# Patient Record
Sex: Male | Born: 1945
Health system: Southern US, Community
[De-identification: ages and names within clinical notes are randomized; demographics above are authoritative.]

## PROBLEM LIST (undated history)

## (undated) DIAGNOSIS — N4231 Prostatic intraepithelial neoplasia: Secondary | ICD-10-CM

## (undated) DIAGNOSIS — I10 Essential (primary) hypertension: Secondary | ICD-10-CM

## (undated) DIAGNOSIS — R42 Dizziness and giddiness: Secondary | ICD-10-CM

## (undated) DIAGNOSIS — C44310 Basal cell carcinoma of skin of unspecified parts of face: Secondary | ICD-10-CM

## (undated) DIAGNOSIS — Z86018 Personal history of other benign neoplasm: Secondary | ICD-10-CM

## (undated) DIAGNOSIS — E7800 Pure hypercholesterolemia, unspecified: Secondary | ICD-10-CM

## (undated) DIAGNOSIS — L570 Actinic keratosis: Secondary | ICD-10-CM

## (undated) DIAGNOSIS — I209 Angina pectoris, unspecified: Secondary | ICD-10-CM

## (undated) HISTORY — PX: COLONOSCOPY: SHX174

## (undated) HISTORY — DX: Actinic keratosis: L57.0

## (undated) HISTORY — DX: Prostatic intraepithelial neoplasia: N42.31

---
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
## 2006-09-22 ENCOUNTER — Ambulatory Visit: Payer: Self-pay | Admitting: Pain Medicine

## 2006-10-05 ENCOUNTER — Encounter: Admission: RE | Admit: 2006-10-05 | Discharge: 2006-10-05 | Payer: Self-pay | Admitting: Orthopedic Surgery

## 2013-03-31 DIAGNOSIS — R42 Dizziness and giddiness: Secondary | ICD-10-CM

## 2013-03-31 HISTORY — DX: Dizziness and giddiness: R42

## 2014-04-18 ENCOUNTER — Ambulatory Visit: Payer: Self-pay | Admitting: Gastroenterology

## 2014-04-18 LAB — HM COLONOSCOPY

## 2014-08-01 ENCOUNTER — Other Ambulatory Visit: Payer: Self-pay | Admitting: Orthopedic Surgery

## 2014-08-01 DIAGNOSIS — M25561 Pain in right knee: Secondary | ICD-10-CM

## 2014-08-03 ENCOUNTER — Ambulatory Visit
Admission: RE | Admit: 2014-08-03 | Discharge: 2014-08-03 | Disposition: A | Discharge status: Home / Self Care | Payer: 59 | Source: Ambulatory Visit | Attending: Orthopedic Surgery | Admitting: Orthopedic Surgery

## 2014-08-03 DIAGNOSIS — X58XXXA Exposure to other specified factors, initial encounter: Secondary | ICD-10-CM | POA: Insufficient documentation

## 2014-08-03 DIAGNOSIS — M25561 Pain in right knee: Secondary | ICD-10-CM | POA: Diagnosis present

## 2014-08-03 DIAGNOSIS — S83241A Other tear of medial meniscus, current injury, right knee, initial encounter: Secondary | ICD-10-CM | POA: Insufficient documentation

## 2014-08-16 ENCOUNTER — Encounter: Payer: Self-pay | Admitting: *Deleted

## 2014-08-16 ENCOUNTER — Inpatient Hospital Stay: Admission: RE | Admit: 2014-08-16 | Payer: Medicare Other | Source: Ambulatory Visit

## 2014-08-16 DIAGNOSIS — Z7982 Long term (current) use of aspirin: Secondary | ICD-10-CM | POA: Diagnosis not present

## 2014-08-16 DIAGNOSIS — E780 Pure hypercholesterolemia: Secondary | ICD-10-CM | POA: Diagnosis not present

## 2014-08-16 DIAGNOSIS — M23221 Derangement of posterior horn of medial meniscus due to old tear or injury, right knee: Secondary | ICD-10-CM | POA: Diagnosis present

## 2014-08-16 DIAGNOSIS — I10 Essential (primary) hypertension: Secondary | ICD-10-CM | POA: Diagnosis not present

## 2014-08-16 DIAGNOSIS — Z79899 Other long term (current) drug therapy: Secondary | ICD-10-CM | POA: Diagnosis not present

## 2014-08-16 DIAGNOSIS — R42 Dizziness and giddiness: Secondary | ICD-10-CM | POA: Diagnosis not present

## 2014-08-16 NOTE — Patient Instructions (Signed)
  Your procedure is scheduled on: 08-23-14 Report to [facility] 2nd Floor To find out your arrival time please call [phone] between 1PM - 3PM on 08-22-14 (Tuesday)  Remember: Instructions that are not followed completely may result in serious medical risk, up to and including death, or upon the discretion of your surgeon and anesthesiologist your surgery may need to be rescheduled.    __x__ 1. Do not eat food or drink liquids after midnight. No gum chewing or hard candies.     __x__ 2. No Alcohol for 24 hours before or after surgery.   ____ 3. Bring all medications with you on the day of surgery if instructed.    __x__ 4. Notify your doctor if there is any change in your medical condition     (cold, fever, infections).     Do not wear jewelry, make-up, hairpins, clips or nail polish.  Do not wear lotions, powders, or perfumes. You may wear deodorant.  Do not shave 48 hours prior to surgery. Men may shave face and neck.  Do not bring valuables to the hospital.    [facility] is not responsible for any belongings or valuables.               Contacts, dentures or bridgework may not be worn into surgery.  Leave your suitcase in the car. After surgery it may be brought to your room.  For patients admitted to the hospital, discharge time is determined by your treatment team.   Patients discharged the day of surgery will not be allowed to drive home.   Please read over the following fact sheets that you were given:   CHG INSTRUCTIONS   ____ Take these medicines the morning of surgery with A SIP OF WATER: NONE     ____ Fleet Enema (as directed)   __X__ Use CHG Soap as directed  ____ Use inhalers on the day of surgery  ____ Stop metformin 2 days prior to surgery    ____ Take 1/2 of usual insulin dose the night before surgery and none on the morning of surgery.   __X__ Stop ASPIRIN NOW (08-16-14)  ____ Stop Anti-inflammatories -MAY TAKE TYLENOL IF  NEEDED   __X__ Stop supplements until after surgery-STOP FISH OIL NOW (08-16-14)  ____ Bring C-Pap to the hospital.

## 2014-08-21 ENCOUNTER — Encounter
Admission: RE | Admit: 2014-08-21 | Discharge: 2014-08-21 | Disposition: A | Discharge status: Home / Self Care | Payer: 59 | Source: Ambulatory Visit | Attending: Orthopedic Surgery | Admitting: Orthopedic Surgery

## 2014-08-21 DIAGNOSIS — E780 Pure hypercholesterolemia: Secondary | ICD-10-CM | POA: Diagnosis not present

## 2014-08-21 DIAGNOSIS — I10 Essential (primary) hypertension: Secondary | ICD-10-CM | POA: Diagnosis not present

## 2014-08-21 LAB — URINALYSIS COMPLETE WITH MICROSCOPIC (ARMC ONLY)
Bacteria, UA: NONE SEEN
Bilirubin Urine: NEGATIVE
GLUCOSE, UA: NEGATIVE mg/dL
Hgb urine dipstick: NEGATIVE
KETONES UR: NEGATIVE mg/dL
Leukocytes, UA: NEGATIVE
Nitrite: NEGATIVE
PH: 5 (ref 5.0–8.0)
Protein, ur: NEGATIVE mg/dL
Specific Gravity, Urine: 1.026 (ref 1.005–1.030)
Squamous Epithelial / LPF: NONE SEEN

## 2014-08-21 LAB — BASIC METABOLIC PANEL
Anion gap: 8 (ref 5–15)
BUN: 22 mg/dL — ABNORMAL HIGH (ref 6–20)
CO2: 28 mmol/L (ref 22–32)
Calcium: 9.3 mg/dL (ref 8.9–10.3)
Chloride: 105 mmol/L (ref 101–111)
Creatinine, Ser: 1.21 mg/dL (ref 0.61–1.24)
GFR calc non Af Amer: >60 mL/min (ref >60)
Glucose, Bld: 104 mg/dL — ABNORMAL HIGH (ref 65–99)
Potassium: 4.3 mmol/L (ref 3.5–5.1)
Sodium: 141 mmol/L (ref 135–145)

## 2014-08-21 LAB — CBC
HCT: 46.3 % (ref 40.0–52.0)
Hemoglobin: 15.3 g/dL (ref 13.0–18.0)
MCH: 29.7 pg (ref 26.0–34.0)
MCHC: 32.9 g/dL (ref 32.0–36.0)
MCV: 90.1 fL (ref 80.0–100.0)
PLATELETS: 219 10*3/uL (ref 150–440)
RBC: 5.14 MIL/uL (ref 4.40–5.90)
RDW: 13.5 % (ref 11.5–14.5)
WBC: 6.4 10*3/uL (ref 3.8–10.6)

## 2014-08-21 LAB — PROTIME-INR
INR: 0.89
Prothrombin Time: 12.3 seconds (ref 11.4–15.0)

## 2014-08-21 LAB — APTT: APTT: 26 s (ref 24–36)

## 2014-08-23 ENCOUNTER — Encounter
Admission: RE | Disposition: A | Discharge status: Home / Self Care | Payer: Self-pay | Source: Ambulatory Visit | Attending: Orthopedic Surgery

## 2014-08-23 ENCOUNTER — Ambulatory Visit: Payer: 59 | Admitting: Anesthesiology

## 2014-08-23 ENCOUNTER — Ambulatory Visit
Admission: RE | Admit: 2014-08-23 | Discharge: 2014-08-23 | Disposition: A | Discharge status: Home / Self Care | Payer: 59 | Source: Ambulatory Visit | Attending: Orthopedic Surgery | Admitting: Orthopedic Surgery

## 2014-08-23 ENCOUNTER — Encounter: Payer: Self-pay | Admitting: Anesthesiology

## 2014-08-23 DIAGNOSIS — E780 Pure hypercholesterolemia: Secondary | ICD-10-CM | POA: Insufficient documentation

## 2014-08-23 DIAGNOSIS — I10 Essential (primary) hypertension: Secondary | ICD-10-CM | POA: Insufficient documentation

## 2014-08-23 DIAGNOSIS — Z7982 Long term (current) use of aspirin: Secondary | ICD-10-CM | POA: Insufficient documentation

## 2014-08-23 DIAGNOSIS — Z79899 Other long term (current) drug therapy: Secondary | ICD-10-CM | POA: Insufficient documentation

## 2014-08-23 DIAGNOSIS — Z9889 Other specified postprocedural states: Secondary | ICD-10-CM

## 2014-08-23 DIAGNOSIS — R42 Dizziness and giddiness: Secondary | ICD-10-CM | POA: Insufficient documentation

## 2014-08-23 HISTORY — PX: KNEE ARTHROSCOPY WITH MEDIAL MENISECTOMY: SHX5651

## 2014-08-23 HISTORY — DX: Essential (primary) hypertension: I10

## 2014-08-23 HISTORY — DX: Pure hypercholesterolemia, unspecified: E78.00

## 2014-08-23 HISTORY — DX: Dizziness and giddiness: R42

## 2014-08-23 SURGERY — ARTHROSCOPY, KNEE, WITH MEDIAL MENISCECTOMY
Anesthesia: General | Laterality: Right | Wound class: Clean

## 2014-08-23 MED ORDER — ONDANSETRON HCL 4 MG/2ML IJ SOLN
INTRAMUSCULAR | Status: DC | PRN
  Administered 2014-08-23: 4 mg via INTRAVENOUS

## 2014-08-23 MED ORDER — KETOROLAC TROMETHAMINE 30 MG/ML IJ SOLN
INTRAMUSCULAR | Status: DC | PRN
  Administered 2014-08-23: 30 mg via INTRAVENOUS

## 2014-08-23 MED ORDER — FENTANYL CITRATE (PF) 100 MCG/2ML IJ SOLN
25.0000 ug | INTRAMUSCULAR | Status: DC | PRN
  Administered 2014-08-23 (×4): 25 ug via INTRAVENOUS

## 2014-08-23 MED ORDER — CEFAZOLIN SODIUM-DEXTROSE 2-3 GM-% IV SOLR: 2.0000 g | Freq: Once | INTRAVENOUS | Status: DC

## 2014-08-23 MED ORDER — MIDAZOLAM HCL 2 MG/2ML IJ SOLN
INTRAMUSCULAR | Status: DC | PRN
  Administered 2014-08-23: 2 mg via INTRAVENOUS

## 2014-08-23 MED ORDER — HYDROCODONE-ACETAMINOPHEN 5-325 MG PO TABS: 2.0000 | ORAL_TABLET | ORAL | Status: DC | PRN

## 2014-08-23 MED ORDER — PHENYLEPHRINE HCL 10 MG/ML IJ SOLN
INTRAMUSCULAR | Status: DC | PRN
  Administered 2014-08-23 (×2): 100 ug via INTRAVENOUS

## 2014-08-23 MED ORDER — FENTANYL CITRATE (PF) 100 MCG/2ML IJ SOLN
INTRAMUSCULAR | Status: AC
  Administered 2014-08-23: 25 ug via INTRAVENOUS
  Filled 2014-08-23: qty 2

## 2014-08-23 MED ORDER — LIDOCAINE HCL (PF) 1 % IJ SOLN
INTRAMUSCULAR | Status: AC
  Filled 2014-08-23: qty 2

## 2014-08-23 MED ORDER — BUPIVACAINE-EPINEPHRINE (PF) 0.25% -1:200000 IJ SOLN
INTRAMUSCULAR | Status: AC
  Filled 2014-08-23: qty 30

## 2014-08-23 MED ORDER — EPHEDRINE SULFATE 50 MG/ML IJ SOLN
INTRAMUSCULAR | Status: DC | PRN
Start: 2014-08-23 — End: 2014-08-23
  Administered 2014-08-23: 5 mg via INTRAVENOUS
  Administered 2014-08-23: 20 mg via INTRAVENOUS

## 2014-08-23 MED ORDER — GLYCOPYRROLATE 0.2 MG/ML IJ SOLN
INTRAMUSCULAR | Status: DC | PRN
  Administered 2014-08-23: .15 mg via INTRAVENOUS

## 2014-08-23 MED ORDER — LACTATED RINGERS IV SOLN
INTRAVENOUS | Status: DC
  Administered 2014-08-23: 13:00:00 via INTRAVENOUS

## 2014-08-23 MED ORDER — CEFAZOLIN SODIUM-DEXTROSE 2-3 GM-% IV SOLR
INTRAVENOUS | Status: AC
  Administered 2014-08-23: 2 g via INTRAVENOUS
  Filled 2014-08-23: qty 50

## 2014-08-23 MED ORDER — FENTANYL CITRATE (PF) 100 MCG/2ML IJ SOLN
INTRAMUSCULAR | Status: DC | PRN
  Administered 2014-08-23: 100 ug via INTRAVENOUS

## 2014-08-23 MED ORDER — HYDROCODONE-ACETAMINOPHEN 5-325 MG PO TABS: 1.0000 | ORAL_TABLET | Freq: Four times a day (QID) | ORAL | Status: DC | PRN

## 2014-08-23 MED ORDER — LIDOCAINE HCL 1 % IJ SOLN
INTRAMUSCULAR | Status: DC | PRN
  Administered 2014-08-23: 10 mL

## 2014-08-23 MED ORDER — ONDANSETRON HCL 4 MG/2ML IJ SOLN: 4.0000 mg | Freq: Once | INTRAMUSCULAR | Status: DC | PRN

## 2014-08-23 MED ORDER — ASPIRIN 325 MG PO TBEC: 325.0000 mg | DELAYED_RELEASE_TABLET | Freq: Every day | ORAL | Status: DC

## 2014-08-23 MED ORDER — PROMETHAZINE HCL 12.5 MG PO TABS: 12.5000 mg | ORAL_TABLET | ORAL | Status: DC | PRN

## 2014-08-23 MED ORDER — FAMOTIDINE 20 MG PO TABS
ORAL_TABLET | ORAL | Status: AC
  Administered 2014-08-23: 20 mg via ORAL
  Filled 2014-08-23: qty 1

## 2014-08-23 MED ORDER — BUPIVACAINE-EPINEPHRINE 0.25% -1:200000 IJ SOLN
INTRAMUSCULAR | Status: DC | PRN
  Administered 2014-08-23: 30 mL

## 2014-08-23 MED ORDER — FAMOTIDINE 20 MG PO TABS
20.0000 mg | ORAL_TABLET | Freq: Once | ORAL | Status: AC
  Administered 2014-08-23: 20 mg via ORAL

## 2014-08-23 MED ORDER — LIDOCAINE HCL (PF) 1 % IJ SOLN
INTRAMUSCULAR | Status: AC
  Filled 2014-08-23: qty 30

## 2014-08-23 SURGICAL SUPPLY — 35 items
BUR RADIUS 3.5 (BURR) ×3 IMPLANT
BUR RADIUS 4.0X18.5 (BURR) ×3 IMPLANT
CLOSURE WOUND 1/2 X4 (GAUZE/BANDAGES/DRESSINGS) ×1
COOLER POLAR GLACIER W/PUMP (MISCELLANEOUS) ×3 IMPLANT
DRAPE IMP U-DRAPE 54X76 (DRAPES) ×3 IMPLANT
DURAPREP 26ML APPLICATOR (WOUND CARE) ×9 IMPLANT
GAUZE PETRO XEROFOAM 1X8 (MISCELLANEOUS) ×3 IMPLANT
GAUZE SPONGE 4X4 12PLY STRL (GAUZE/BANDAGES/DRESSINGS) ×3 IMPLANT
GLOVE BIOGEL PI IND STRL 9 (GLOVE) ×1 IMPLANT
GLOVE BIOGEL PI INDICATOR 9 (GLOVE) ×2
GLOVE SURG 9.0 ORTHO LTXF (GLOVE) ×3 IMPLANT
GOWN L4 XXLG W/PAP TWL (GOWN DISPOSABLE) ×3 IMPLANT
GOWN STRL REUS W/ TWL LRG LVL3 (GOWN DISPOSABLE) ×1 IMPLANT
GOWN STRL REUS W/TWL LRG LVL3 (GOWN DISPOSABLE) ×2
IV LACTATED RINGER IRRG 3000ML (IV SOLUTION) ×12
IV LR IRRIG 3000ML ARTHROMATIC (IV SOLUTION) ×6 IMPLANT
KIT RM TURNOVER STRD PROC AR (KITS) ×3 IMPLANT
MANIFOLD NEPTUNE II (INSTRUMENTS) ×3 IMPLANT
PACK ARTHROSCOPY KNEE (MISCELLANEOUS) ×3 IMPLANT
PAD ABD DERMACEA PRESS 5X9 (GAUZE/BANDAGES/DRESSINGS) ×6 IMPLANT
PAD CAST CTTN 4X4 STRL (SOFTGOODS) ×1 IMPLANT
PAD WRAPON POLAR KNEE (MISCELLANEOUS) ×1 IMPLANT
PADDING CAST COTTON 4X4 STRL (SOFTGOODS) ×2
SET TUBE SUCT SHAVER OUTFL 24K (TUBING) ×3 IMPLANT
SOL PREP PVP 2OZ (MISCELLANEOUS) ×3
SOLUTION PREP PVP 2OZ (MISCELLANEOUS) ×1 IMPLANT
STOCKINETTE BIAS CUT 4 980044 (GAUZE/BANDAGES/DRESSINGS) ×3 IMPLANT
STRIP CLOSURE SKIN 1/2X4 (GAUZE/BANDAGES/DRESSINGS) ×2 IMPLANT
SUT ETHILON 4-0 (SUTURE) ×2
SUT ETHILON 4-0 FS2 18XMFL BLK (SUTURE) ×1
SUTURE ETHLN 4-0 FS2 18XMF BLK (SUTURE) ×1 IMPLANT
TUBING ARTHRO INFLOW-ONLY STRL (TUBING) ×3 IMPLANT
WAND HAND CNTRL MULTIVAC 50 (MISCELLANEOUS) ×3 IMPLANT
WAND HAND CNTRL MULTIVAC 90 (MISCELLANEOUS) ×3 IMPLANT
WRAPON POLAR PAD KNEE (MISCELLANEOUS) ×3

## 2014-08-23 NOTE — Transfer of Care (Signed)
Immediate Anesthesia Transfer of Care Note  Patient: Hector Woodard  Procedure(s) Performed: Procedure(s): KNEE ARTHROSCOPY WITH MEDIAL MENISECTOMY, plica excision and synovectomy (Right)  Patient Location: PACU  Anesthesia Type:General  Level of Consciousness: Alert, Awake, Oriented  Airway & Oxygen Therapy: Patient Spontanous Breathing  Post-op Assessment: Report given to RN  Post vital signs: Reviewed and stable  Last Vitals:  Filed Vitals:   08/23/14 1509  BP: 125/63  Pulse: 66  Temp: 36.4 C  Resp: 16    Complications: No apparent anesthesia complications

## 2014-08-23 NOTE — Anesthesia Postprocedure Evaluation (Signed)
  Anesthesia Post-op Note  Patient: Hector Woodard  Procedure(s) Performed: Procedure(s): KNEE ARTHROSCOPY WITH MEDIAL MENISECTOMY, plica excision and synovectomy (Right)  Anesthesia type:General  Patient location: PACU  Post pain: Pain level controlled  Post assessment: Post-op Vital signs reviewed, Patient's Cardiovascular Status Stable, Respiratory Function Stable, Patent Airway and No signs of Nausea or vomiting  Post vital signs: Reviewed and stable  Last Vitals:  Filed Vitals:   08/23/14 1531  BP: 120/73  Pulse: 60  Temp:   Resp: 12    Level of consciousness: awake, alert  and patient cooperative  Complications: No apparent anesthesia complications

## 2014-08-23 NOTE — Discharge Instructions (Signed)
AMBULATORY SURGERY  DISCHARGE INSTRUCTIONS   1) The drugs that you were given will stay in your system until tomorrow so for the next 24 hours you should not:  A) Drive an automobile B) Make any legal decisions C) Drink any alcoholic beverage   2) You may resume regular meals tomorrow.  Today it is better to start with liquids and gradually work up to solid foods.  You may eat anything you prefer, but it is better to start with liquids, then soup and crackers, and gradually work up to solid foods.   3) Please notify your doctor immediately if you have any unusual bleeding, trouble breathing, redness and pain at the surgery site, drainage, fever, or pain not relieved by medication. 4)   5) Your post-operative visit with Dr.    George Ina                                 is: Date:                        Time:    Please call to schedule your post-operative visit.  6) Additional Instructions: 7)

## 2014-08-23 NOTE — Anesthesia Preprocedure Evaluation (Signed)
Anesthesia Evaluation  Patient identified by MRN, date of birth, ID band Patient awake    Reviewed: Allergy & Precautions, NPO status , Patient's Chart, lab work & pertinent test results  Airway Mallampati: II  TM Distance: >3 FB Neck ROM: Full    Dental  (+) Chipped   Pulmonary          Cardiovascular hypertension,     Neuro/Psych    GI/Hepatic   Endo/Other    Renal/GU      Musculoskeletal   Abdominal   Peds  Hematology   Anesthesia Other Findings   Reproductive/Obstetrics                             Anesthesia Physical Anesthesia Plan  ASA: II  Anesthesia Plan: General   Post-op Pain Management:    Induction: Intravenous  Airway Management Planned: LMA  Additional Equipment:   Intra-op Plan:   Post-operative Plan:   Informed Consent: I have reviewed the patients History and Physical, chart, labs and discussed the procedure including the risks, benefits and alternatives for the proposed anesthesia with the patient or authorized representative who has indicated his/her understanding and acceptance.     Plan Discussed with: CRNA  Anesthesia Plan Comments:         Anesthesia Quick Evaluation

## 2014-08-23 NOTE — H&P (Signed)
PREOPERATIVE H&P  Chief Complaint: TEAR OF MEDIAL MENISCUS OF KNEE  HPI: Hector Woodard is a 69 y.o. male who presents for preoperative history and physical with a diagnosis of TEAR OF MEDIAL MENISCUS OF KNEE. Patient has moderate intermittent pain with mechanical symptoms. His symptoms are worsened by running. He is an avid runner and this has prevented him from participating in his normal exercise routine.   His MRI has shown a tear of the medial meniscus. He has elected for surgical management.   Past Medical History  Diagnosis Date  . Hypertension   . Hypercholesteremia   . Vertigo 2015   Past Surgical History  Procedure Laterality Date  . Colonoscopy      X2   History   Social History  . Marital Status: Married    Spouse Name: N/A  . Number of Children: N/A  . Years of Education: N/A   Social History Main Topics  . Smoking status: Never Smoker   . Smokeless tobacco: Not on file  . Alcohol Use: Yes     Comment: wine 2x/week  . Drug Use: No  . Sexual Activity: Not on file   Other Topics Concern  . None   Social History Narrative   History reviewed. No pertinent family history. No Known Allergies Prior to Admission medications   Medication Sig Start Date End Date Taking? Authorizing Provider  aspirin 81 MG tablet Take 81 mg by mouth daily.   Yes Historical Provider, MD  benazepril (LOTENSIN) 20 MG tablet Take 20 mg by mouth at bedtime.   Yes Historical Provider, MD  Fish Oil OIL by Does not apply route.   Yes Historical Provider, MD  Multiple Vitamin (MULTIVITAMIN) capsule Take 1 capsule by mouth daily.   Yes Historical Provider, MD  Omega-3 Fatty Acids (FISH OIL) 1000 MG CAPS Take 1 capsule by mouth at bedtime.   Yes Historical Provider, MD  simvastatin (ZOCOR) 40 MG tablet Take 40 mg by mouth at bedtime.   Yes Historical Provider, MD     Positive ROS: All other systems have been reviewed and were otherwise negative with the exception of those mentioned in the  HPI and as above.  Physical Exam: General: Alert, no acute distress Cardiovascular:  regular rate and rhythm no murmurs rubs or gallops No pedal edema Respiratory:  clear to auscultation bilaterally No cyanosis, no use of accessory musculature GI: No organomegaly, abdomen is soft and non-tender, nondistended with positive bowel sounds  Skin: No lesions in the area of chief complaint Neurologic: Sensation intact distally Psychiatric: Patient is competent for consent with normal mood and affect Lymphatic: No cervical lymphadenopathy  MUSCright knee patient is no erythema or ecchymosis or effusion. His range of motion is from 0-130. He has point tenderness over the medial joint line and a positive McMurray's test. He has 5 out of 5 strength in all muscle groups, intact sensation to light touch in palpable pedal pulses. He had no ligamentous laxity on exam.   Assessment: TEAR OF MEDIAL MENISCUS OF  RIGHT KNEE  Plan: Plan for Procedure(s): KNEE ARTHROSCOPY WITH  right partial MEDIAL MENISECTOMY  I discussed the findings on MRI with the patient in my office prior to surgery. He is aware of the tear of the medial meniscus. He is having mechanical symptoms that prevent him from running. He would like to proceed with arthroscopic partial medial meniscectomy. I discussed with him the details of surgery.  I discussed the risks and benefits of surgery. The  risks include but are not limited to infection, bleeding requiring blood transfusion, nerve or blood vessel injury, malunion, nonunion, joint stiffness or loss of motion, persistent pain, weakness or instability,and the need for further surgery. Medical risks include but are not limited to DVT and pulmonary embolism,: Myocardial infarction, stroke, pneumonia, respiratory failure and death. Patient understood these risks and wished to proceed.    Thornton Park, MD   08/23/2014 1:19 PM

## 2014-08-23 NOTE — Op Note (Signed)
08/17/2014  2:42 PM  PATIENT:  Hector Woodard  PRE-OPERATIVE DIAGNOSIS:  TEAR OF MEDIAL MENISCUS  POST-OPERATIVE DIAGNOSIS:  Same  PROCEDURE:  KNEE ARTHROSCOPY WITH  Partial MEDIAL MENISECTOMY, synovectomy  SURGEON:  Thornton Park, MD  ANESTHESIA:   General  PREOPERATIVE INDICATIONS:  Hector Woodard  69 y.o. male with a diagnosis of TEAR OF MEDIAL MENISCUS, right knee, who failed conservative management and elected for surgical management.    The risks benefits and alternatives were discussed with the patient preoperatively including the risks of infection, bleeding, nerve injury, knee stiffness, persistent pain, osteoarthritis and the need for further surgery. Medical course include DVT and pulmonary embolism, myocardial infarction, stroke, pneumonia, respiratory failure and death. The patient understood these risks and wished to proceed.  OPERATIVE FINDINGS: A tear of the posterior horn of the medial meniscus was identified with a large mobile flap which was unstable. There was also diffuse grade 1 and 2 chondral wear of the medial femoral condyle and grade 1 and 2 chondral wear of the undersurface of the patella with grade 3/4 wear of the femoral trochlea.   OPERATIVE PROCEDURE: Patient was met in the preoperative area. The operative extremity was signed with the word yes and my initials according the hospital's correct site of surgery protocol.  He is brought in the operating room where he was placed supine on the operative table. He underwent general anesthesia. He was prepped and draped in a sterile fashion.  A timeout was performed to verify the patient's name, date of birth, medical record number, correct site of surgery correct procedure to be performed. It was also used to verify the patient had had received antibiotics that all appropriate instruments, and radiographic studies were available in the room. Once all in attendance were in agreement case began.  Proposed  arthroscopy incisions were drawn out with a surgical marker. These were pre-injected with 1% lidocaine plain. An 11 blade was used to establish an inferior lateral and inferomedial portals. The inferomedial portal was created using a 18-gauge spinal needle under direct visualization.  A full diagnostic examination of the knee was performed including the suprapatellar pouch, patellofemoral joint, medial lateral compartments as well as the medial lateral gutters, the intercondylar notch in the posterior knee.  Findings on arthroscopy included diffuse synovitis and diffuse degenerative changes of the undersurface of the patella and the associated femoral trochlea. Patient also had diffuse degenerative changes in the medial compartment within the medial femoral condyle , femoral trochlea and undersurface of patella. He had a grade 3 chondral lesion involving the femoral trochlea. Patient had a torn medial meniscus with an unstable flap. The lateral compartment had no evidence of lateral meniscus tear. The anterior cruciate ligament was intact.  Patient had the meniscal tear treated with a 4-0 resector shaver blade and straight duckbill basket. The meniscus was debrided until a stable rim was achieved. A chondroplasty of the medial femoral condyle, trochlea and undersurface of patella was also performed using a 4 resector shaver blade. A partial synovectomy was also performed using a 4-0 resector shaver blade and 90 ArthroCare wand.  The knee was then copiously lavaged. All arthroscopic incisions removed. The 2 arthroscopy portals were closed with 4-0 nylon. Steri-Strips were applied along with a dry sterile and compressive dressing. The patient was brought to the PACU in stable condition. I scrubbed and present the entire case and all sharp and instrument counts were correct at the conclusion the case. I spoke to the patient's family  postoperatively to let them know the case it done without complication and the  patient was stable in the recovery room.  Timoteo Gaul, MD

## 2014-08-24 ENCOUNTER — Encounter: Payer: Self-pay | Admitting: Orthopedic Surgery

## 2014-09-04 DIAGNOSIS — M25661 Stiffness of right knee, not elsewhere classified: Secondary | ICD-10-CM | POA: Diagnosis not present

## 2014-09-04 DIAGNOSIS — M25561 Pain in right knee: Secondary | ICD-10-CM | POA: Diagnosis not present

## 2014-09-20 DIAGNOSIS — M25561 Pain in right knee: Secondary | ICD-10-CM | POA: Diagnosis not present

## 2014-09-20 DIAGNOSIS — M25661 Stiffness of right knee, not elsewhere classified: Secondary | ICD-10-CM | POA: Diagnosis not present

## 2014-11-20 ENCOUNTER — Telehealth: Payer: Self-pay | Admitting: Family Medicine

## 2014-11-20 MED ORDER — ZOLPIDEM TARTRATE 10 MG PO TABS
10.0000 mg | ORAL_TABLET | Freq: Every evening | ORAL | Status: DC | PRN
Start: 2014-11-20 — End: 2015-02-28

## 2014-11-20 MED ORDER — CIPROFLOXACIN HCL 500 MG PO TABS: 500.0000 mg | ORAL_TABLET | Freq: Two times a day (BID) | ORAL | Status: DC

## 2014-11-20 NOTE — Telephone Encounter (Signed)
Pt called stated he talked with [facility] about a Hep B injections. Pt stated [facility] is also supposed to write an RX for Ambien and antibiotic, pt is leaving this week for a month long trip to [place]. I am adding pt to schedule for an injection. Please advise about medications. Thanks.

## 2014-11-22 ENCOUNTER — Ambulatory Visit (INDEPENDENT_AMBULATORY_CARE_PROVIDER_SITE_OTHER): Payer: Commercial Managed Care - HMO

## 2014-11-22 DIAGNOSIS — Z23 Encounter for immunization: Secondary | ICD-10-CM | POA: Diagnosis not present

## 2015-01-05 ENCOUNTER — Other Ambulatory Visit: Payer: Self-pay

## 2015-01-05 NOTE — Telephone Encounter (Addendum)
[name] DOB: 05/15/1945 MRN: [id number]  Patient requests simvastatin 40 mg tab # 30 and benazepril hcl 20mg  tab # 30.

## 2015-01-08 MED ORDER — BENAZEPRIL HCL 20 MG PO TABS
20.0000 mg | ORAL_TABLET | Freq: Every day | ORAL | Status: DC
Start: 2015-01-08 — End: 2015-02-12

## 2015-01-08 MED ORDER — SIMVASTATIN 40 MG PO TABS: 40.0000 mg | ORAL_TABLET | Freq: Every day | ORAL | Status: DC

## 2015-01-23 ENCOUNTER — Encounter: Payer: Self-pay | Admitting: Family Medicine

## 2015-02-12 ENCOUNTER — Encounter: Payer: Self-pay | Admitting: Family Medicine

## 2015-02-12 ENCOUNTER — Ambulatory Visit (INDEPENDENT_AMBULATORY_CARE_PROVIDER_SITE_OTHER): Payer: Commercial Managed Care - HMO | Admitting: Family Medicine

## 2015-02-12 VITALS — BP 121/64 | HR 65 | Temp 98.8°F | Ht 71.7 in | Wt 197.0 lb

## 2015-02-12 DIAGNOSIS — Z0000 Encounter for general adult medical examination without abnormal findings: Secondary | ICD-10-CM | POA: Diagnosis not present

## 2015-02-12 DIAGNOSIS — E7800 Pure hypercholesterolemia, unspecified: Secondary | ICD-10-CM | POA: Diagnosis not present

## 2015-02-12 DIAGNOSIS — Z23 Encounter for immunization: Secondary | ICD-10-CM

## 2015-02-12 DIAGNOSIS — N4231 Prostatic intraepithelial neoplasia: Secondary | ICD-10-CM | POA: Insufficient documentation

## 2015-02-12 DIAGNOSIS — R079 Chest pain, unspecified: Secondary | ICD-10-CM | POA: Diagnosis not present

## 2015-02-12 DIAGNOSIS — I10 Essential (primary) hypertension: Secondary | ICD-10-CM

## 2015-02-12 LAB — URINALYSIS, ROUTINE W REFLEX MICROSCOPIC
Bilirubin, UA: NEGATIVE
Glucose, UA: NEGATIVE
Ketones, UA: NEGATIVE
Leukocytes, UA: NEGATIVE
Nitrite, UA: NEGATIVE
Protein, UA: NEGATIVE
Specific Gravity, UA: 1.015 (ref 1.005–1.030)
Urobilinogen, Ur: 0.2 mg/dL (ref 0.2–1.0)
pH, UA: 6 (ref 5.0–7.5)

## 2015-02-12 LAB — MICROSCOPIC EXAMINATION
Epithelial Cells (non renal): NONE SEEN /hpf (ref 0–10)
WBC, UA: NONE SEEN /hpf (ref 0–?)

## 2015-02-12 MED ORDER — SIMVASTATIN 40 MG PO TABS: 40.0000 mg | ORAL_TABLET | Freq: Every day | ORAL | Status: DC

## 2015-02-12 MED ORDER — BENAZEPRIL HCL 20 MG PO TABS: 20.0000 mg | ORAL_TABLET | Freq: Every day | ORAL | Status: DC

## 2015-02-12 NOTE — Assessment & Plan Note (Signed)
The current medical regimen is effective;  continue present plan and medications.  

## 2015-02-12 NOTE — Assessment & Plan Note (Signed)
EKG no acute changes Patient with change in cardiovascular status with loss of ability to have peak exertion, with bilateral arm numbness with exertion and nonspecific chest slight numbness. Because the symptoms will refer to cardiology to further evaluate

## 2015-02-12 NOTE — Progress Notes (Signed)
BP 121/64 mmHg  Pulse 65  Temp(Src) 98.8 F (37.1 C)  Ht 5' 11.7" (1.821 m)  Wt 197 lb (89.359 kg)  BMI 26.95 kg/m2  SpO2 99%   Subjective:    Patient ID: Hector Woodard, male    DOB: Jan 09, 1946, 69 y.o.   MRN: [id number]  HPI: Hector Woodard is a 69 y.o. male  Chief Complaint  Patient presents with  . Annual Exam   Patient for annual physical exam but is noticed after a vacation to [place] that is unable to get his heart rate up to as high as usual 150s or so with his usual running now can only get up into the upper one teens before he gets markedly breathless and needs to slow down. Pace's gone from 12 minute to 15 minute miles Otherwise is doing okay with no chest pain chest tightness for routine daily activities or mild distress. Does little better with a long slow warmup but otherwise upper gear is lacking  Blood pressures doing well no complaints from medications Cluster on doing well no complaints from medications  Relevant past medical, surgical, family and social history reviewed and updated as indicated. Interim medical history since our last visit reviewed. Allergies and medications reviewed and updated.  Review of Systems  Constitutional: Negative.   HENT: Negative.   Eyes: Negative.   Respiratory: Negative.   Cardiovascular: Negative.   Gastrointestinal: Negative.   Endocrine: Negative.   Genitourinary: Negative.   Musculoskeletal: Negative.   Skin: Negative.   Allergic/Immunologic: Negative.   Neurological: Negative.   Hematological: Negative.   Psychiatric/Behavioral: Negative.     Per HPI unless specifically indicated above     Objective:    BP 121/64 mmHg  Pulse 65  Temp(Src) 98.8 F (37.1 C)  Ht 5' 11.7" (1.821 m)  Wt 197 lb (89.359 kg)  BMI 26.95 kg/m2  SpO2 99%  Wt Readings from Last 3 Encounters:  02/12/15 197 lb (89.359 kg)  08/08/14 203 lb (92.08 kg)  08/16/14 200 lb (90.719 kg)    Physical Exam  Constitutional: He is oriented  to person, place, and time. He appears well-developed and well-nourished.  HENT:  Head: Normocephalic and atraumatic.  Right Ear: External ear normal.  Left Ear: External ear normal.  Eyes: Conjunctivae and EOM are normal. Pupils are equal, round, and reactive to light.  Neck: Normal range of motion. Neck supple.  Cardiovascular: Normal rate, regular rhythm, normal heart sounds and intact distal pulses.   Pulmonary/Chest: Effort normal and breath sounds normal.  Abdominal: Soft. Bowel sounds are normal. There is no splenomegaly or hepatomegaly.  Genitourinary: Rectum normal, prostate normal and penis normal.  Musculoskeletal: Normal range of motion.  Neurological: He is alert and oriented to person, place, and time. He has normal reflexes.  Skin: No rash noted. No erythema.  Psychiatric: He has a normal mood and affect. His behavior is normal. Judgment and thought content normal.    Results for orders placed or performed during the hospital encounter of [id number]  Basic metabolic panel  Result Value Ref Range   Sodium 141 135 - 145 mmol/L   Potassium 4.3 3.5 - 5.1 mmol/L   Chloride 105 101 - 111 mmol/L   CO2 28 22 - 32 mmol/L   Glucose, Bld 104 (H) 65 - 99 mg/dL   BUN 22 (H) 6 - 20 mg/dL   Creatinine, Ser 1.21 0.61 - 1.24 mg/dL   Calcium 9.3 8.9 - 10.3 mg/dL   GFR calc  non Af Amer >60 >60 mL/min   GFR calc Af Amer >60 >60 mL/min   Anion gap 8 5 - 15  CBC  Result Value Ref Range   WBC 6.4 3.8 - 10.6 K/uL   RBC 5.14 4.40 - 5.90 MIL/uL   Hemoglobin 15.3 13.0 - 18.0 g/dL   HCT 46.3 40.0 - 52.0 %   MCV 90.1 80.0 - 100.0 fL   MCH 29.7 26.0 - 34.0 pg   MCHC 32.9 32.0 - 36.0 g/dL   RDW 13.5 11.5 - 14.5 %   Platelets 219 150 - 440 K/uL  APTT  Result Value Ref Range   aPTT 26 24 - 36 seconds  Protime-INR  Result Value Ref Range   Prothrombin Time 12.3 11.4 - 15.0 seconds   INR 0.89   Urinalysis complete, with microscopic [facility])  Result Value Ref Range   Color, Urine YELLOW (A)  YELLOW   APPearance HAZY (A) CLEAR   Glucose, UA NEGATIVE NEGATIVE mg/dL   Bilirubin Urine NEGATIVE NEGATIVE   Ketones, ur NEGATIVE NEGATIVE mg/dL   Specific Gravity, Urine 1.026 1.005 - 1.030   Hgb urine dipstick NEGATIVE NEGATIVE   pH 5.0 5.0 - 8.0   Protein, ur NEGATIVE NEGATIVE mg/dL   Nitrite NEGATIVE NEGATIVE   Leukocytes, UA NEGATIVE NEGATIVE   RBC / HPF 0-5 0 - 5 RBC/hpf   WBC, UA 0-5 0 - 5 WBC/hpf   Bacteria, UA NONE SEEN NONE SEEN   Squamous Epithelial / LPF NONE SEEN NONE SEEN   Mucous PRESENT       Assessment & Plan:   Problem List Items Addressed This Visit      Cardiovascular and Mediastinum   Essential hypertension   Relevant Medications   aspirin EC 81 MG tablet   benazepril (LOTENSIN) 20 MG tablet   simvastatin (ZOCOR) 40 MG tablet   Other Relevant Orders   Comprehensive metabolic panel   Lipid panel   CBC with Differential/Platelet   TSH   Urinalysis, Routine w reflex microscopic (not at [facility])     Genitourinary   PIN (prostatic intraepithelial neoplasia)   Relevant Orders   PSA     Other   Hypercholesteremia    The current medical regimen is effective;  continue present plan and medications.       Relevant Medications   aspirin EC 81 MG tablet   benazepril (LOTENSIN) 20 MG tablet   simvastatin (ZOCOR) 40 MG tablet   Other Relevant Orders   Comprehensive metabolic panel   Lipid panel   CBC with Differential/Platelet   TSH   Urinalysis, Routine w reflex microscopic (not at [facility])   Chest pain    EKG no acute changes Patient with change in cardiovascular status with loss of ability to have peak exertion, with bilateral arm numbness with exertion and nonspecific chest slight numbness. Because the symptoms will refer to cardiology to further evaluate      Relevant Orders   Comprehensive metabolic panel   Lipid panel   CBC with Differential/Platelet   TSH   Urinalysis, Routine w reflex microscopic (not at [facility])   EKG 12-Lead (Completed)    Ambulatory referral to Cardiology    Other Visit Diagnoses    Immunization due    -  Primary    Relevant Orders    Pneumococcal conjugate vaccine 13-valent IM (Completed)    Flu Vaccine QUAD 36+ mos PF IM (Fluarix & Fluzone Quad PF) (Completed)    PE (physical exam), annual  Follow up plan: Return in about 6 months (around 08/12/2015) for BMP lipids, alt, ast.

## 2015-02-13 LAB — LIPID PANEL
Chol/HDL Ratio: 2.6 ratio units (ref 0.0–5.0)
Cholesterol, Total: 180 mg/dL (ref 100–199)
HDL: 70 mg/dL (ref >39)
LDL Calculated: 92 mg/dL (ref 0–99)
Triglycerides: 91 mg/dL (ref 0–149)
VLDL Cholesterol Cal: 18 mg/dL (ref 5–40)

## 2015-02-13 LAB — CBC WITH DIFFERENTIAL/PLATELET
Basophils Absolute: 0 10*3/uL (ref 0.0–0.2)
Basos: 1 %
EOS (ABSOLUTE): 0.2 10*3/uL (ref 0.0–0.4)
Eos: 3 %
Hematocrit: 45.2 % (ref 37.5–51.0)
Hemoglobin: 15.2 g/dL (ref 12.6–17.7)
IMMATURE GRANULOCYTES: 1 %
Immature Grans (Abs): 0 10*3/uL (ref 0.0–0.1)
Lymphocytes Absolute: 1.9 10*3/uL (ref 0.7–3.1)
Lymphs: 35 %
MCH: 30.2 pg (ref 26.6–33.0)
MCHC: 33.6 g/dL (ref 31.5–35.7)
MCV: 90 fL (ref 79–97)
MONOS ABS: 0.4 10*3/uL (ref 0.1–0.9)
Monocytes: 7 %
NEUTROS PCT: 53 %
Neutrophils Absolute: 2.9 10*3/uL (ref 1.4–7.0)
PLATELETS: 197 10*3/uL (ref 150–379)
RBC: 5.03 x10E6/uL (ref 4.14–5.80)
RDW: 13.6 % (ref 12.3–15.4)
WBC: 5.4 10*3/uL (ref 3.4–10.8)

## 2015-02-13 LAB — COMPREHENSIVE METABOLIC PANEL
ALT: 35 IU/L (ref 0–44)
AST: 73 IU/L — ABNORMAL HIGH (ref 0–40)
Albumin/Globulin Ratio: 1.8 (ref 1.1–2.5)
Albumin: 4.3 g/dL (ref 3.6–4.8)
Alkaline Phosphatase: 50 IU/L (ref 39–117)
BUN/Creatinine Ratio: 9 — ABNORMAL LOW (ref 10–22)
BUN: 11 mg/dL (ref 8–27)
Bilirubin Total: 0.7 mg/dL (ref 0.0–1.2)
CO2: 25 mmol/L (ref 18–29)
Calcium: 9.2 mg/dL (ref 8.6–10.2)
Chloride: 102 mmol/L (ref 97–106)
Creatinine, Ser: 1.17 mg/dL (ref 0.76–1.27)
GFR calc Af Amer: 74 mL/min/{1.73_m2} (ref >59)
GFR calc non Af Amer: 64 mL/min/{1.73_m2} (ref >59)
Globulin, Total: 2.4 g/dL (ref 1.5–4.5)
Glucose: 104 mg/dL — ABNORMAL HIGH (ref 65–99)
Potassium: 4.8 mmol/L (ref 3.5–5.2)
Sodium: 141 mmol/L (ref 136–144)
Total Protein: 6.7 g/dL (ref 6.0–8.5)

## 2015-02-13 LAB — PSA: Prostate Specific Ag, Serum: 3.6 ng/mL (ref 0.0–4.0)

## 2015-02-13 LAB — TSH: TSH: 3.37 u[IU]/mL (ref 0.450–4.500)

## 2015-02-14 ENCOUNTER — Other Ambulatory Visit: Payer: Self-pay | Admitting: Family Medicine

## 2015-02-14 DIAGNOSIS — R748 Abnormal levels of other serum enzymes: Secondary | ICD-10-CM

## 2015-02-14 DIAGNOSIS — R079 Chest pain, unspecified: Secondary | ICD-10-CM

## 2015-02-14 NOTE — Addendum Note (Signed)
Addended byGolden Pop on: 02/14/2015 12:14 PM   Modules accepted: Miquel Dunn

## 2015-02-14 NOTE — Progress Notes (Signed)
Phone call Discussed with patient slight elevation in liver function patient not drinking unusual or any real change Will observe recheck CMP 1 month Patient's cardiology appointments not until January will work on changing to earlier appointment.

## 2015-02-26 DIAGNOSIS — R0789 Other chest pain: Secondary | ICD-10-CM | POA: Diagnosis not present

## 2015-02-26 DIAGNOSIS — R9431 Abnormal electrocardiogram [ECG] [EKG]: Secondary | ICD-10-CM | POA: Insufficient documentation

## 2015-02-26 DIAGNOSIS — E782 Mixed hyperlipidemia: Secondary | ICD-10-CM | POA: Diagnosis not present

## 2015-02-27 ENCOUNTER — Other Ambulatory Visit: Payer: Self-pay | Admitting: Cardiology

## 2015-02-27 ENCOUNTER — Other Ambulatory Visit: Payer: Self-pay | Admitting: Orthopedic Surgery

## 2015-02-27 ENCOUNTER — Telehealth: Payer: Self-pay | Admitting: Family Medicine

## 2015-02-27 DIAGNOSIS — E785 Hyperlipidemia, unspecified: Secondary | ICD-10-CM | POA: Diagnosis not present

## 2015-02-27 DIAGNOSIS — R9431 Abnormal electrocardiogram [ECG] [EKG]: Secondary | ICD-10-CM | POA: Diagnosis not present

## 2015-02-27 DIAGNOSIS — R0789 Other chest pain: Secondary | ICD-10-CM | POA: Diagnosis not present

## 2015-02-27 DIAGNOSIS — I10 Essential (primary) hypertension: Secondary | ICD-10-CM | POA: Diagnosis not present

## 2015-02-27 NOTE — Telephone Encounter (Signed)
Pt came in stated [facility] had referred him to Dr. Ubaldo Glassing after an Echocardiogram they are putting patient into the hospital for a cardiac cath and possibly 2 stents. Pt states if [facility] doesn't have an issue he is going to go forward with Dr. Bethanne Ginger recommendations. Please call pt and advise. Thanks.

## 2015-02-28 ENCOUNTER — Encounter
Admission: RE | Disposition: A | Discharge status: Home / Self Care | Payer: Self-pay | Source: Ambulatory Visit | Attending: Cardiology

## 2015-02-28 ENCOUNTER — Observation Stay
Admission: RE | Admit: 2015-02-28 | Discharge: 2015-03-01 | Disposition: A | Discharge status: Home / Self Care | Payer: Commercial Managed Care - HMO | Source: Ambulatory Visit | Attending: Cardiology | Admitting: Cardiology

## 2015-02-28 ENCOUNTER — Encounter: Payer: Self-pay | Admitting: *Deleted

## 2015-02-28 DIAGNOSIS — R9439 Abnormal result of other cardiovascular function study: Secondary | ICD-10-CM | POA: Diagnosis not present

## 2015-02-28 DIAGNOSIS — I2510 Atherosclerotic heart disease of native coronary artery without angina pectoris: Secondary | ICD-10-CM | POA: Diagnosis not present

## 2015-02-28 DIAGNOSIS — I4510 Unspecified right bundle-branch block: Secondary | ICD-10-CM | POA: Insufficient documentation

## 2015-02-28 DIAGNOSIS — Z79899 Other long term (current) drug therapy: Secondary | ICD-10-CM | POA: Insufficient documentation

## 2015-02-28 DIAGNOSIS — Z7982 Long term (current) use of aspirin: Secondary | ICD-10-CM | POA: Insufficient documentation

## 2015-02-28 DIAGNOSIS — I259 Chronic ischemic heart disease, unspecified: Secondary | ICD-10-CM | POA: Diagnosis not present

## 2015-02-28 DIAGNOSIS — R001 Bradycardia, unspecified: Secondary | ICD-10-CM | POA: Diagnosis not present

## 2015-02-28 DIAGNOSIS — I25110 Atherosclerotic heart disease of native coronary artery with unstable angina pectoris: Secondary | ICD-10-CM | POA: Diagnosis not present

## 2015-02-28 DIAGNOSIS — R9431 Abnormal electrocardiogram [ECG] [EKG]: Secondary | ICD-10-CM | POA: Insufficient documentation

## 2015-02-28 DIAGNOSIS — E782 Mixed hyperlipidemia: Secondary | ICD-10-CM | POA: Insufficient documentation

## 2015-02-28 DIAGNOSIS — I10 Essential (primary) hypertension: Secondary | ICD-10-CM | POA: Insufficient documentation

## 2015-02-28 DIAGNOSIS — Z8249 Family history of ischemic heart disease and other diseases of the circulatory system: Secondary | ICD-10-CM | POA: Diagnosis not present

## 2015-02-28 DIAGNOSIS — I200 Unstable angina: Secondary | ICD-10-CM | POA: Diagnosis not present

## 2015-02-28 HISTORY — DX: Angina pectoris, unspecified: I20.9

## 2015-02-28 HISTORY — PX: CARDIAC CATHETERIZATION: SHX172

## 2015-02-28 LAB — CKMB (ARMC ONLY): CK, MB: 3.9 ng/mL (ref 0.5–5.0)

## 2015-02-28 SURGERY — LEFT HEART CATH AND CORONARY ANGIOGRAPHY
Anesthesia: Moderate Sedation

## 2015-02-28 MED ORDER — SODIUM CHLORIDE 0.9 % WEIGHT BASED INFUSION: 1.0000 mL/kg/h | INTRAVENOUS | Status: DC

## 2015-02-28 MED ORDER — BIVALIRUDIN 250 MG IV SOLR
INTRAVENOUS | Status: AC
  Filled 2015-02-28: qty 250

## 2015-02-28 MED ORDER — ROSUVASTATIN CALCIUM 40 MG PO TABS
40.0000 mg | ORAL_TABLET | Freq: Every day | ORAL | Status: DC
  Administered 2015-02-28: 40 mg via ORAL
  Filled 2015-02-28 (×2): qty 1

## 2015-02-28 MED ORDER — MIDAZOLAM HCL 2 MG/2ML IJ SOLN
INTRAMUSCULAR | Status: AC
  Filled 2015-02-28: qty 2

## 2015-02-28 MED ORDER — SODIUM CHLORIDE 0.9 % IJ SOLN: 3.0000 mL | INTRAMUSCULAR | Status: DC | PRN

## 2015-02-28 MED ORDER — MIDAZOLAM HCL 2 MG/2ML IJ SOLN
INTRAMUSCULAR | Status: DC | PRN
  Administered 2015-02-28 (×2): 1 mg via INTRAVENOUS

## 2015-02-28 MED ORDER — ONDANSETRON HCL 4 MG/2ML IJ SOLN: 4.0000 mg | Freq: Four times a day (QID) | INTRAMUSCULAR | Status: DC | PRN

## 2015-02-28 MED ORDER — SODIUM CHLORIDE 0.9 % IV SOLN: 250.0000 mL | INTRAVENOUS | Status: DC | PRN

## 2015-02-28 MED ORDER — NITROGLYCERIN 1 MG/10 ML FOR IR/CATH LAB
INTRA_ARTERIAL | Status: DC | PRN
  Administered 2015-02-28 (×2): 200 ug via INTRACORONARY

## 2015-02-28 MED ORDER — OXYCODONE-ACETAMINOPHEN 5-325 MG PO TABS: 1.0000 | ORAL_TABLET | ORAL | Status: DC | PRN

## 2015-02-28 MED ORDER — ACETAMINOPHEN 325 MG PO TABS: 650.0000 mg | ORAL_TABLET | ORAL | Status: DC | PRN

## 2015-02-28 MED ORDER — TICAGRELOR 90 MG PO TABS
ORAL_TABLET | ORAL | Status: DC | PRN
  Administered 2015-02-28: 180 mg via ORAL

## 2015-02-28 MED ORDER — BIVALIRUDIN BOLUS VIA INFUSION - CUPID
INTRAVENOUS | Status: DC | PRN
Start: 2015-02-28 — End: 2015-02-28
  Administered 2015-02-28: 67.05 mg via INTRAVENOUS

## 2015-02-28 MED ORDER — TICAGRELOR 90 MG PO TABS
ORAL_TABLET | ORAL | Status: AC
  Filled 2015-02-28: qty 2

## 2015-02-28 MED ORDER — SODIUM CHLORIDE 0.9 % WEIGHT BASED INFUSION: 3.0000 mL/kg/h | INTRAVENOUS | Status: DC

## 2015-02-28 MED ORDER — ASPIRIN 81 MG PO CHEW: 81.0000 mg | CHEWABLE_TABLET | ORAL | Status: DC

## 2015-02-28 MED ORDER — SODIUM CHLORIDE 0.9 % WEIGHT BASED INFUSION
3.0000 mL/kg/h | INTRAVENOUS | Status: AC
  Administered 2015-02-28: 3 mL/kg/h via INTRAVENOUS

## 2015-02-28 MED ORDER — IOHEXOL 300 MG/ML  SOLN
INTRAMUSCULAR | Status: DC | PRN
  Administered 2015-02-28: 280 mL

## 2015-02-28 MED ORDER — ASPIRIN 81 MG PO CHEW
CHEWABLE_TABLET | ORAL | Status: AC
Start: 2015-02-28 — End: 2015-02-28
  Filled 2015-02-28: qty 4

## 2015-02-28 MED ORDER — FENTANYL CITRATE (PF) 100 MCG/2ML IJ SOLN
INTRAMUSCULAR | Status: DC | PRN
  Administered 2015-02-28 (×2): 50 ug via INTRAVENOUS

## 2015-02-28 MED ORDER — SODIUM CHLORIDE 0.9 % IV SOLN
250.0000 mg | INTRAVENOUS | Status: DC | PRN
  Administered 2015-02-28: 1.75 mg/kg/h via INTRAVENOUS

## 2015-02-28 MED ORDER — HEPARIN (PORCINE) IN NACL 2-0.9 UNIT/ML-% IJ SOLN
INTRAMUSCULAR | Status: AC
  Filled 2015-02-28: qty 1000

## 2015-02-28 MED ORDER — TICAGRELOR 90 MG PO TABS
90.0000 mg | ORAL_TABLET | Freq: Two times a day (BID) | ORAL | Status: DC
  Administered 2015-02-28: 90 mg via ORAL
  Filled 2015-02-28: qty 1

## 2015-02-28 MED ORDER — SODIUM CHLORIDE 0.9 % IJ SOLN
3.0000 mL | Freq: Two times a day (BID) | INTRAMUSCULAR | Status: DC
  Administered 2015-02-28: 3 mL via INTRAVENOUS

## 2015-02-28 MED ORDER — ASPIRIN 81 MG PO CHEW
CHEWABLE_TABLET | ORAL | Status: DC | PRN
  Administered 2015-02-28: 324 mg via ORAL

## 2015-02-28 MED ORDER — SODIUM CHLORIDE 0.9 % IJ SOLN: 3.0000 mL | Freq: Two times a day (BID) | INTRAMUSCULAR | Status: DC

## 2015-02-28 MED ORDER — SODIUM CHLORIDE 0.9 % IV SOLN
250.0000 mL | INTRAVENOUS | Status: DC | PRN
Start: 2015-02-28 — End: 2015-02-28

## 2015-02-28 MED ORDER — ASPIRIN 81 MG PO CHEW: 81.0000 mg | CHEWABLE_TABLET | Freq: Every day | ORAL | Status: DC

## 2015-02-28 MED ORDER — NITROGLYCERIN 5 MG/ML IV SOLN
INTRAVENOUS | Status: AC
  Filled 2015-02-28: qty 10

## 2015-02-28 MED ORDER — FENTANYL CITRATE (PF) 100 MCG/2ML IJ SOLN
INTRAMUSCULAR | Status: AC
  Filled 2015-02-28: qty 2

## 2015-02-28 SURGICAL SUPPLY — 17 items
BALLN TREK RX 2.5X15 (BALLOONS) ×4
BALLOON TREK RX 2.5X15 (BALLOONS) ×2 IMPLANT
CATH INFINITI 5FR ANG PIGTAIL (CATHETERS) ×4 IMPLANT
CATH INFINITI 5FR JL4 (CATHETERS) ×4 IMPLANT
CATH INFINITI 5FR JL5 (CATHETERS) ×4 IMPLANT
CATH INFINITI JR4 5F (CATHETERS) ×4 IMPLANT
CATH VISTA GUIDE 6FR JR4 SH (CATHETERS) ×4 IMPLANT
DEVICE CLOSURE MYNXGRIP 6/7F (Vascular Products) ×4 IMPLANT
DEVICE INFLAT 30 PLUS (MISCELLANEOUS) ×4 IMPLANT
KIT MANI 3VAL PERCEP (MISCELLANEOUS) ×4 IMPLANT
NEEDLE PERC 18GX7CM (NEEDLE) ×4 IMPLANT
PACK CARDIAC CATH (CUSTOM PROCEDURE TRAY) ×4 IMPLANT
SHEATH AVANTI 5FR X 11CM (SHEATH) ×4 IMPLANT
SHEATH AVANTI 6FR X 11CM (SHEATH) ×4 IMPLANT
STENT XIENCE ALPINE RX 3.0X18 (Permanent Stent) ×4 IMPLANT
WIRE EMERALD 3MM-J .035X150CM (WIRE) ×4 IMPLANT
WIRE G HI TQ BMW 190 (WIRE) ×4 IMPLANT

## 2015-02-28 NOTE — Progress Notes (Signed)
Pt admitted to [facility] from [place] post PCI. Site mynx closed, free of bleeding or hematoma, minimal drainage. Pt oriented and admitted to room. Skin verified by Doretha Imus., RN. Pt comfortably resting in room with no complaints, NSR on tele and VSS. Will continue to assess.

## 2015-02-28 NOTE — OR Nursing (Signed)
Sat up without change in groin site, eating  meal

## 2015-02-28 NOTE — H&P (Signed)
Chief Complaint:    Chief Complaint  Patient presents with  . Chest Pain   Date of Service: 02/27/2015 Date of Birth: [birth year] PCP: Guadalupe Maple, MD  History of Present Illness: [name] is a 69 y.o.male patient who presents for evaluation of having some chest tightness with radiation to his om arms when exercising. Patient underwent an ETT today with echo imaging. Was early positive for anterior ischemia. Patient had chest pain along with the ST changes including ST depression inferiorly and ST elevation anteriorly. Will need urgent cardiac catheterization. Patient is hemodynamically stable and pain-free in currently on aspirin and simvastatin as well as benazepril. Past Medical and Surgical History  Past Medical History     Past Medical History  Diagnosis Date  . Hyperlipidemia   . Hypertension     Past Surgical History He has a past surgical history that includes knee surgery.   Medications and Allergies  Current Medications   Current Medications         Current Outpatient Prescriptions  Medication Sig Dispense Refill  . benazepril (LOTENSIN) 20 MG tablet Take 1 tablet (20 mg total) by mouth once daily.    . simvastatin (ZOCOR) 40 MG tablet Take 1 tablet (40 mg total) by mouth nightly. 30 tablet 6   No current facility-administered medications for this visit.       Allergies: Review of patient's allergies indicates no known allergies.  Social and Family History  Social History  reports that he has never smoked. He does not have any smokeless tobacco history on file. He reports that he drinks alcohol.  Family History      Family History  Problem Relation Age of Onset  . Heart failure Mother 40  . Heart attack Father 62    Review of Systems  Review of Systems  Constitutional: Negative for chills, diaphoresis, fever, malaise/fatigue and weight loss.  HENT: Negative for congestion, ear discharge,  hearing loss and tinnitus.  Eyes: Negative for blurred vision.  Respiratory: Negative for cough, hemoptysis, sputum production, shortness of breath and wheezing.  Cardiovascular: Positive for chest pain. Negative for palpitations, orthopnea, claudication, leg swelling and PND.  Gastrointestinal: Negative for abdominal pain, blood in stool, constipation, diarrhea, heartburn, melena, nausea and vomiting.  Genitourinary: Negative for dysuria, frequency, hematuria and urgency.  Musculoskeletal: Negative for back pain, falls, joint pain and myalgias.  Skin: Negative for itching and rash.  Neurological: Negative for dizziness, tingling, focal weakness, loss of consciousness, weakness and headaches.  Endo/Heme/Allergies: Negative for polydipsia. Does not bruise/bleed easily.  Psychiatric/Behavioral: Negative for depression, memory loss and substance abuse. The patient is not nervous/anxious.    Physical Examination   Vitals:     Visit Vitals  . BP 120/81  . Pulse 60   Ht:  Wt:  [id number] is no height or weight on file to calculate BSA. There is no height or weight on file to calculate BMI.     Wt Readings from Last 3 Encounters:  02/26/15 89.8 kg (198 lb)       BP Readings from Last 3 Encounters:  02/27/15 120/81  02/26/15 130/90    General appearance appears in no acute distress  Head Mouth and Eye exam Normocephalic, without obvious abnormality, atraumatic Dentition is good Eyes appear anicteric   Neck exam Thyroid: normal  Nodes: no obvious adenopathy  LUNGS Breath Sounds: Normal Percussion: Normal  CARDIOVASCULAR JVP CV wave: no HJR: no Elevation at 90 degrees: None Carotid Pulse: normal pulsation bilaterally Bruit: None  Apex: apical impulse normal  Auscultation Rhythm: normal sinus rhythm S1: normal S2: normal Clicks: no Rub: no Murmurs: no murmurs  Gallop: None ABDOMEN Liver enlargement: no Pulsatile aorta:  no Ascites: no Bruits: no  EXTREMITIES Clubbing: no Edema: trace to 1+ bilateral pedal edema Pulses: peripheral pulses symmetrical Femoral Bruits: no Amputation: no SKIN Rash: no Cyanosis: no Embolic phemonenon: no Bruising: no NEURO Alert and Oriented to person, place and time: yes Non focal: yes  PSYCH: Pt appears to have normal affect    Diagnostic Studies Reviewed:  EKG EKG demonstrated normal sinus rhythm, ischemic changes noted in Inferior leads.    Assessment and Plan   69 y.o. male with    ICD-10-CM ICD-9-CM   1. Abnormal EKG-patient with chest pain with exertion with an abnormal baseline electrocardiogram showing T-wave inversion in the inferior leads.Has early positive ETT echo. Risk and benefits a left cardiac catheterization were explained to the patient. No beta-blocker added secondary to relative bradycardia the further recommendations after cardiac catheterization is completed R94.31 794.31        2. Anterior chest wall pain-as per above R07.89 786.52        3. Mixed hyperlipidemia-continue with statin and low-fat diet E78.2 272.2       Return in about 1 week (around 03/06/2015).   These notes generated with voice recognition software. I apologize for typographical errors.  Sydnee Levans, MD

## 2015-03-01 ENCOUNTER — Encounter: Payer: Self-pay | Admitting: Cardiology

## 2015-03-01 ENCOUNTER — Ambulatory Visit: Admit: 2015-03-01 | Payer: Self-pay | Admitting: Cardiology

## 2015-03-01 DIAGNOSIS — I4510 Unspecified right bundle-branch block: Secondary | ICD-10-CM | POA: Diagnosis not present

## 2015-03-01 DIAGNOSIS — R9431 Abnormal electrocardiogram [ECG] [EKG]: Secondary | ICD-10-CM | POA: Diagnosis not present

## 2015-03-01 DIAGNOSIS — I259 Chronic ischemic heart disease, unspecified: Secondary | ICD-10-CM | POA: Diagnosis not present

## 2015-03-01 DIAGNOSIS — I25110 Atherosclerotic heart disease of native coronary artery with unstable angina pectoris: Secondary | ICD-10-CM | POA: Diagnosis not present

## 2015-03-01 DIAGNOSIS — R001 Bradycardia, unspecified: Secondary | ICD-10-CM | POA: Diagnosis not present

## 2015-03-01 DIAGNOSIS — E782 Mixed hyperlipidemia: Secondary | ICD-10-CM | POA: Diagnosis not present

## 2015-03-01 DIAGNOSIS — Z8249 Family history of ischemic heart disease and other diseases of the circulatory system: Secondary | ICD-10-CM | POA: Diagnosis not present

## 2015-03-01 DIAGNOSIS — I10 Essential (primary) hypertension: Secondary | ICD-10-CM | POA: Diagnosis not present

## 2015-03-01 DIAGNOSIS — R9439 Abnormal result of other cardiovascular function study: Secondary | ICD-10-CM | POA: Diagnosis not present

## 2015-03-01 LAB — BASIC METABOLIC PANEL
ANION GAP: 6 (ref 5–15)
BUN: 13 mg/dL (ref 6–20)
CALCIUM: 8.5 mg/dL — AB (ref 8.9–10.3)
CHLORIDE: 105 mmol/L (ref 101–111)
CO2: 27 mmol/L (ref 22–32)
CREATININE: 1.26 mg/dL — AB (ref 0.61–1.24)
GFR calc non Af Amer: 57 mL/min — ABNORMAL LOW (ref >60)
Glucose, Bld: 106 mg/dL — ABNORMAL HIGH (ref 65–99)
Potassium: 3.7 mmol/L (ref 3.5–5.1)
SODIUM: 138 mmol/L (ref 135–145)

## 2015-03-01 SURGERY — LEFT HEART CATH
Anesthesia: Moderate Sedation

## 2015-03-01 MED ORDER — ASPIRIN 81 MG PO CHEW
CHEWABLE_TABLET | ORAL | Status: AC
  Administered 2015-03-01: 81 mg
  Filled 2015-03-01: qty 1

## 2015-03-01 MED ORDER — METOPROLOL SUCCINATE ER 25 MG PO TB24: 25.0000 mg | ORAL_TABLET | Freq: Every day | ORAL | Status: DC

## 2015-03-01 MED ORDER — ROSUVASTATIN CALCIUM 40 MG PO TABS
40.0000 mg | ORAL_TABLET | Freq: Every day | ORAL | Status: DC
Start: 2015-03-01 — End: 2015-05-09

## 2015-03-01 MED ORDER — CLOPIDOGREL BISULFATE 75 MG PO TABS: 75.0000 mg | ORAL_TABLET | Freq: Every day | ORAL | Status: DC

## 2015-03-01 MED ORDER — ROSUVASTATIN CALCIUM 20 MG PO TABS: 40.0000 mg | ORAL_TABLET | Freq: Every day | ORAL | Status: DC

## 2015-03-01 MED ORDER — METOPROLOL SUCCINATE ER 25 MG PO TB24
25.0000 mg | ORAL_TABLET | Freq: Every day | ORAL | Status: DC
  Administered 2015-03-01: 25 mg via ORAL
  Filled 2015-03-01: qty 1

## 2015-03-01 MED ORDER — CLOPIDOGREL BISULFATE 75 MG PO TABS
75.0000 mg | ORAL_TABLET | Freq: Every day | ORAL | Status: DC
  Administered 2015-03-01: 75 mg via ORAL
  Filled 2015-03-01: qty 1

## 2015-03-01 NOTE — Final Progress Note (Signed)
Physician Final Progress Note  Patient ID: Hector Woodard MRN: [id number] DOB/AGE: [birth year] 69 y.o.  Admit date: 02/28/2015 Admitting provider: Teodoro Spray, MD Discharge date: 03/01/2015   Admission Diagnoses: unstable angina  Discharge Diagnoses:  Active Problems:   Unstable angina (HCC)  CAD s/p pci of rca with DES  Consults: None  Significant Findings/ Diagnostic Studies: labs: creatinine was 1.26  Procedures: s/p pci of rca after left cardiac cath  Discharge Condition: good  Disposition: 01-Home or Self Care  Diet: Cardiac diet  Discharge Activity: No lifting, driving, or strenuous exercise for 3 days  Discharge Instructions    AMB Referral to Cardiac Rehabilitation - Phase II    Complete by:  As directed   Diagnosis:  PCI            Medication List    STOP taking these medications        benazepril 20 MG tablet  Commonly known as:  LOTENSIN     simvastatin 40 MG tablet  Commonly known as:  ZOCOR      TAKE these medications        aspirin EC 81 MG tablet  Take 81 mg by mouth daily.     clopidogrel 75 MG tablet  Commonly known as:  PLAVIX  Take 1 tablet (75 mg total) by mouth daily.     Fish Oil 1000 MG Caps  Take 1 capsule by mouth at bedtime.     metoprolol succinate 25 MG 24 hr tablet  Commonly known as:  TOPROL-XL  Take 1 tablet (25 mg total) by mouth daily.     multivitamin capsule  Take 1 capsule by mouth daily.     rosuvastatin 40 MG tablet  Commonly known as:  CRESTOR  Take 1 tablet (40 mg total) by mouth daily at 6 PM.           Follow-up Information    Follow up with Teodoro Spray., MD.   Specialty:  Cardiology   Why:  Post PCI   Contact information:   [place] [address] [phone]       Total time spent taking care of this patient: 30 minutes  Signed: Breyton Vanscyoc A. 03/01/2015, 8:01 AM

## 2015-03-01 NOTE — Care Management Obs Status (Signed)
[facility] NOTIFICATION   Patient Details  Name: Hector Woodard MRN: [id number] Date of Birth: 05/25/1945   Medicare Observation Status Notification Given:  Yes    Katrina Stack, RN 03/01/2015, 9:00 AM

## 2015-03-01 NOTE — Discharge Summary (Signed)
Physician Discharge Summary  Patient ID: Hector Woodard MRN: [id number] DOB/AGE: Jun 14, 1945 69 y.o.  Admit date: 02/28/2015 Discharge date: 03/01/2015  Admission Diagnoses:  Discharge Diagnoses:  Active Problems:   Unstable angina Hector Woodard)   Discharged Condition: good  Hospital Course: Pt presented for outpatient cardiac cath due to unstable angina and high risk functional study. Cath revealed 99% mid rca lesion with moderate disease in lad and lcx. Underwent pci of rca with 3.0 x 18 Xience DES with no complications. Monitored on telemetry post pci. No complications or arrhythmia noted. No chest pain. No groin symptoms.   Consults: None  Significant Diagnostic Studies: labs: met b and ckmb  Treatments: IV hydration  Discharge Exam: Blood pressure 125/60, pulse 53, temperature 98.1 F (36.7 C), temperature source Oral, resp. rate 18, height 5' 11.75" (1.822 m), weight 87.544 kg (193 lb), SpO2 96 %. General appearance: alert and cooperative Head: Normocephalic, without obvious abnormality, atraumatic Resp: clear to auscultation bilaterally Chest wall: no tenderness Cardio: regular rate and rhythm, S1, S2 normal, no murmur, click, rub or gallop GI: soft, non-tender; bowel sounds normal; no masses,  no organomegaly Pulses: 2+ and symmetric Neurologic: Grossly normal Incision/Wound:no evidence of hematoma or bruit at cath site.   Disposition: 01-Home or Self Care  Discharge Instructions    AMB Referral to Cardiac Rehabilitation - Phase II    Complete by:  As directed   Diagnosis:  PCI            Medication List    STOP taking these medications        benazepril 20 MG tablet  Commonly known as:  LOTENSIN     simvastatin 40 MG tablet  Commonly known as:  ZOCOR      TAKE these medications        aspirin EC 81 MG tablet  Take 81 mg by mouth daily.     clopidogrel 75 MG tablet  Commonly known as:  PLAVIX  Take 1 tablet (75 mg total) by mouth daily.     Fish Oil  1000 MG Caps  Take 1 capsule by mouth at bedtime.     metoprolol succinate 25 MG 24 hr tablet  Commonly known as:  TOPROL-XL  Take 1 tablet (25 mg total) by mouth daily.     multivitamin capsule  Take 1 capsule by mouth daily.     rosuvastatin 40 MG tablet  Commonly known as:  CRESTOR  Take 1 tablet (40 mg total) by mouth daily at 6 PM.           Follow-up Information    Follow up with Hector Spray., MD.   Specialty:  Cardiology   Why:  Post PCI   Contact information:   [place] [address] [phone]       Signed: Bartholome Bill A. 03/01/2015, 7:58 AM

## 2015-03-01 NOTE — Progress Notes (Signed)
Assessment is nonremarkable.  Groin site intact, no hematoma.   VSS, alert and oriented, no pain.  Patient has ambulated without issues.   Given vascular discharge instructions.  Patient repeated back important information about activity restrictions.  Given information about 3 new medications.  Patient able to repeat back when to take medications and when to follow up with cardiology and PCP.   Removed telemetry and PIVs.    Patient escorted out of hospital via wheelchair by volunteers.  Family at bedside.

## 2015-03-12 ENCOUNTER — Encounter: Payer: Commercial Managed Care - HMO | Attending: Internal Medicine | Admitting: *Deleted

## 2015-03-12 VITALS — Ht 72.0 in | Wt 196.0 lb

## 2015-03-12 DIAGNOSIS — Z9861 Coronary angioplasty status: Secondary | ICD-10-CM | POA: Diagnosis not present

## 2015-03-12 NOTE — Patient Instructions (Signed)
Patient Instructions  Patient Details  Name: Hector Woodard MRN: [id number] Date of Birth: [birth year] Referring Provider:  Yolonda Kida, MD  Below are the personal goals you chose as well as exercise and nutrition goals. Our goal is to help you keep on track towards obtaining and maintaining your goals. We will be discussing your progress on these goals with you throughout the program.  Initial Exercise Prescription:     Initial Exercise Prescription - 03/12/15 1400    Date of Initial Exercise Prescription   Date 03/12/15   Treadmill   MPH 3.1   Grade 0   Minutes 15   Bike   Level 1.5   Minutes 15   Recumbant Bike   Level 3   RPM 50   Minutes 15   NuStep   Level 4   Watts 60   Minutes 15   Arm Ergometer   Level 2   Watts 10   Minutes 15   Arm/Foot Ergometer   Level 2   Watts 15   Minutes 15   Cybex   Level 3   RPM 50   Minutes 15   Recumbant Elliptical   Level 2   Watts 30   Minutes 15   Elliptical   Level 1   Speed 3   Minutes 15   REL-XR   Level 4   Watts 60   Minutes 15   T5 Nustep   Level 3   Watts 30   Minutes 15   Biostep-RELP   Level 4   Watts 60   Minutes 15   Prescription Details   Frequency (times per week) 3   Duration Progress to 30 minutes of continuous aerobic without signs/symptoms of physical distress   Intensity   THRR REST +  30   Ratings of Perceived Exertion 11-15   Progression Continue progressive overload as per policy without signs/symptoms or physical distress.   Resistance Training   Training Prescription Yes   Weight 3   Reps 10-15      Exercise Goals: Frequency: Be able to perform aerobic exercise three times per week working toward 3-5 days per week.  Intensity: Work with a perceived exertion of 11 (fairly light) - 15 (hard) as tolerated. Follow your new exercise prescription and watch for changes in prescription as you progress with the program. Changes will be reviewed with you when they are  made.  Duration: You should be able to do 30 minutes of continuous aerobic exercise in addition to a 5 minute warm-up and a 5 minute cool-down routine.  Nutrition Goals: Your personal nutrition goals will be established when you do your nutrition analysis with the dietician.  The following are nutrition guidelines to follow: Cholesterol < 200mg /day Sodium < 1500mg /day Fiber: Men over 50 yrs - 30 grams per day  Personal Goals:     Personal Goals and Risk Factors at Admission - 03/12/15 1342    Personal Goals and Risk Factors on Admission    Weight Management --  Would like to lose 10-12 pounds   Admit Weight 193 lb (87.544 kg)   Goal Weight 182 lb (82.555 kg)      Tobacco Use Initial Evaluation: History  Smoking status  . Never Smoker   Smokeless tobacco  . Never Used    Copy of goals given to participant.

## 2015-03-12 NOTE — Progress Notes (Signed)
Cardiac Individual Treatment Plan  Patient Details  Name: Hector Woodard MRN: [id number] Date of Birth: [birth year] Referring Provider:  Yolonda Kida, MD  Initial Encounter Date: Date: 03/12/15  Visit Diagnosis: S/P PTCA (percutaneous transluminal coronary angioplasty)  Patient's Home Medications on Admission:  Current outpatient prescriptions:  .  aspirin EC 81 MG tablet, Take 81 mg by mouth daily., Disp: , Rfl:  .  clopidogrel (PLAVIX) 75 MG tablet, Take 1 tablet (75 mg total) by mouth daily., Disp: 30 tablet, Rfl: 11 .  metoprolol succinate (TOPROL-XL) 25 MG 24 hr tablet, Take 1 tablet (25 mg total) by mouth daily., Disp: 30 tablet, Rfl: 11 .  Multiple Vitamin (MULTIVITAMIN) capsule, Take 1 capsule by mouth daily., Disp: , Rfl:  .  Omega-3 Fatty Acids (FISH OIL) 1000 MG CAPS, Take 1 capsule by mouth at bedtime., Disp: , Rfl:  .  rosuvastatin (CRESTOR) 40 MG tablet, Take 1 tablet (40 mg total) by mouth daily at 6 PM. (Patient not taking: Reported on 03/12/2015), Disp: 30 tablet, Rfl: 11  Past Medical History: Past Medical History  Diagnosis Date  . Hypercholesteremia   . Vertigo 2015  . PIN (prostatic intraepithelial neoplasia)   . Anginal pain ([place])   . Hypertension     Tobacco Use: History  Smoking status  . Never Smoker   Smokeless tobacco  . Never Used    Labs: Recent Review Flowsheet Data    Labs for ITP Cardiac and Pulmonary Rehab Latest Ref Rng 02/12/2015   Cholestrol 100 - 199 mg/dL 180   LDLCALC 0 - 99 mg/dL 92   HDL >39 mg/dL 70   Trlycerides 0 - 149 mg/dL 91       Exercise Target Goals: Date: 03/12/15  Exercise Program Goal: Individual exercise prescription set with THRR, safety & activity barriers. Participant demonstrates ability to understand and report RPE using BORG scale, to self-measure pulse accurately, and to acknowledge the importance of the exercise prescription.  Exercise Prescription Goal: Starting with aerobic activity 30 plus  minutes a day, 3 days per week for initial exercise prescription. Provide home exercise prescription and guidelines that participant acknowledges understanding prior to discharge.  Activity Barriers & Risk Stratification:     Activity Barriers & Risk Stratification - 03/12/15 1522    Activity Barriers & Risk Stratification   Risk Stratification --  3 vessel disease >60% all three       6 Minute Walk:     6 Minute Walk      03/12/15 1421       6 Minute Walk   Phase Initial     Distance 1642 feet     Walk Time 6 minutes     Resting HR 68 bpm     Resting BP 124/72 mmHg     Max Ex. HR 98 bpm     Max Ex. BP 140/60 mmHg     RPE 7     Symptoms No        Initial Exercise Prescription:     Initial Exercise Prescription - 03/12/15 1400    Date of Initial Exercise Prescription   Date 03/12/15   Treadmill   MPH 3.1   Grade 0   Minutes 15   Bike   Level 1.5   Minutes 15   Recumbant Bike   Level 3   RPM 50   Minutes 15   NuStep   Level 4   Watts 60   Minutes 15   Arm Ergometer  Level 2   Watts 10   Minutes 15   Arm/Foot Ergometer   Level 2   Watts 15   Minutes 15   Cybex   Level 3   RPM 50   Minutes 15   Recumbant Elliptical   Level 2   Watts 30   Minutes 15   Elliptical   Level 1   Speed 3   Minutes 15   REL-XR   Level 4   Watts 60   Minutes 15   T5 Nustep   Level 3   Watts 30   Minutes 15   Biostep-RELP   Level 4   Watts 60   Minutes 15   Prescription Details   Frequency (times per week) 3   Duration Progress to 30 minutes of continuous aerobic without signs/symptoms of physical distress   Intensity   THRR REST +  30   Ratings of Perceived Exertion 11-15   Progression Continue progressive overload as per policy without signs/symptoms or physical distress.   Resistance Training   Training Prescription Yes   Weight 3   Reps 10-15      Exercise Prescription Changes:     Exercise Prescription Changes      03/12/15 1500            Response to Exercise   Blood Pressure (Admit) 124/72 mmHg       Blood Pressure (Exercise) 140/60 mmHg       Blood Pressure (Exit) 114/66 mmHg       Heart Rate (Admit) 68 bpm       Heart Rate (Exercise) 98 bpm       Heart Rate (Exit) 66 bpm       Rating of Perceived Exertion (Exercise) 7       Symptoms none reported during walk          Discharge Exercise Prescription (Final Exercise Prescription Changes):     Exercise Prescription Changes - 03/12/15 1500    Response to Exercise   Blood Pressure (Admit) 124/72 mmHg   Blood Pressure (Exercise) 140/60 mmHg   Blood Pressure (Exit) 114/66 mmHg   Heart Rate (Admit) 68 bpm   Heart Rate (Exercise) 98 bpm   Heart Rate (Exit) 66 bpm   Rating of Perceived Exertion (Exercise) 7   Symptoms none reported during walk      Nutrition:  Target Goals: Understanding of nutrition guidelines, daily intake of sodium 1500mg , cholesterol 200mg , calories 30% from fat and 7% or less from saturated fats, daily to have 5 or more servings of fruits and vegetables.  Biometrics:     Pre Biometrics - 03/12/15 1424    Pre Biometrics   Height 6' (1.829 m)   Weight 196 lb (88.905 kg)   Waist Circumference 36 inches   Hip Circumference 42 inches   Waist to Hip Ratio 0.86 %   BMI (Calculated) 26.6       Nutrition Therapy Plan and Nutrition Goals:   Nutrition Discharge: Rate Your Plate Scores:   Nutrition Goals Re-Evaluation:   Psychosocial: Target Goals: Acknowledge presence or absence of depression, maximize coping skills, provide positive support system. Participant is able to verbalize types and ability to use techniques and skills needed for reducing stress and depression.  Initial Review & Psychosocial Screening:     Initial Psych Review & Screening - 03/12/15 [place]? Yes   Comments Family lives close to home   Barriers  Psychosocial barriers to participate in program There are no  identifiable barriers or psychosocial needs.;The patient should benefit from training in stress management and relaxation.   Screening Interventions   Interventions Encouraged to exercise      Quality of Life Scores:   PHQ-9:     Recent Review Flowsheet Data    Depression screen [facility] 2/9 03/12/2015 02/12/2015   Decreased Interest 0 0   Down, Depressed, Hopeless 0 0   PHQ - 2 Score 0 0   Altered sleeping 0 -   Tired, decreased energy 1 -   Change in appetite 0 -   Feeling bad or failure about yourself  0 -   Trouble concentrating 0 -   Moving slowly or fidgety/restless 0 -   Suicidal thoughts 0 -   PHQ-9 Score 1 -   Difficult doing work/chores Not difficult at all -      Psychosocial Evaluation and Intervention:   Psychosocial Re-Evaluation:   Vocational Rehabilitation: Provide vocational rehab assistance to qualifying candidates.   Vocational Rehab Evaluation & Intervention:     Vocational Rehab - 03/12/15 1323    Initial Vocational Rehab Evaluation & Intervention   Assessment shows need for Vocational Rehabilitation No      Education: Education Goals: Education classes will be provided on a weekly basis, covering required topics. Participant will state understanding/return demonstration of topics presented.  Learning Barriers/Preferences:     Learning Barriers/Preferences - 03/12/15 1322    Learning Barriers/Preferences   Learning Barriers None   Learning Preferences None      Education Topics: General Nutrition Guidelines/Fats and Fiber: -Group instruction provided by verbal, written material, models and posters to present the general guidelines for heart healthy nutrition. Gives an explanation and review of dietary fats and fiber.   Controlling Sodium/Reading Food Labels: -Group verbal and written material supporting the discussion of sodium use in heart healthy nutrition. Review and explanation with models, verbal and written materials for  utilization of the food label.   Exercise Physiology & Risk Factors: - Group verbal and written instruction with models to review the exercise physiology of the cardiovascular system and associated critical values. Details cardiovascular disease risk factors and the goals associated with each risk factor.   Aerobic Exercise & Resistance Training: - Gives group verbal and written discussion on the health impact of inactivity. On the components of aerobic and resistive training programs and the benefits of this training and how to safely progress through these programs.   Flexibility, Balance, General Exercise Guidelines: - Provides group verbal and written instruction on the benefits of flexibility and balance training programs. Provides general exercise guidelines with specific guidelines to those with heart or lung disease. Demonstration and skill practice provided.   Stress Management: - Provides group verbal and written instruction about the health risks of elevated stress, cause of high stress, and healthy ways to reduce stress.   Depression: - Provides group verbal and written instruction on the correlation between heart/lung disease and depressed mood, treatment options, and the stigmas associated with seeking treatment.   Anatomy & Physiology of the Heart: - Group verbal and written instruction and models provide basic cardiac anatomy and physiology, with the coronary electrical and arterial systems. Review of: AMI, Angina, Valve disease, Heart Failure, Cardiac Arrhythmia, Pacemakers, and the ICD.   Cardiac Procedures: - Group verbal and written instruction and models to describe the testing methods done to diagnose heart disease. Reviews the outcomes of the test results. Describes the treatment  choices: Medical Management, Angioplasty, or Coronary Bypass Surgery.   Cardiac Medications: - Group verbal and written instruction to review commonly prescribed medications for heart  disease. Reviews the medication, class of the drug, and side effects. Includes the steps to properly store meds and maintain the prescription regimen.   Go Sex-Intimacy & Heart Disease, Get SMART - Goal Setting: - Group verbal and written instruction through game format to discuss heart disease and the return to sexual intimacy. Provides group verbal and written material to discuss and apply goal setting through the application of the S.M.A.R.T. Method.   Other Matters of the Heart: - Provides group verbal, written materials and models to describe Heart Failure, Angina, Valve Disease, and Diabetes in the realm of heart disease. Includes description of the disease process and treatment options available to the cardiac patient.   Exercise & Equipment Safety: - Individual verbal instruction and demonstration of equipment use and safety with use of the equipment.          Cardiac Rehab from 03/12/2015 in [facility]   Date  03/12/15   Educator  SB   Instruction Review Code  2- meets goals/outcomes      Infection Prevention: - Provides verbal and written material to individual with discussion of infection control including proper hand washing and proper equipment cleaning during exercise session.      Cardiac Rehab from 03/12/2015 in [facility]iac [facility]   Date  03/12/15   Educator  Sb   Instruction Review Code  2- meets goals/outcomes      Falls Prevention: - Provides verbal and written material to individual with discussion of falls prevention and safety.      Cardiac Rehab from 03/12/2015 in [facility] Re[facility]   Date  03/12/15   Educator  SB   Instruction Review Code  2- meets goals/outcomes      Diabetes: - Individual verbal and written instruction to review signs/symptoms of diabetes, desired ranges of glucose level fasting, after meals and with exercise. Advice that pre and post exercise glucose checks will be done for 3 sessions at entry of program.    Knowledge  Questionnaire Score:     Knowledge Questionnaire Score - 03/12/15 1322    Knowledge Questionnaire Score   Pre Score 22/28      Personal Goals and Risk Factors at Admission:     Personal Goals and Risk Factors at Admission - 03/12/15 1342    Personal Goals and Risk Factors on Admission    Weight Management --  Would like to lose 10-12 pounds   Admit Weight 193 lb (87.544 kg)   Goal Weight 182 lb (82.555 kg)      Personal Goals and Risk Factors Review:    Personal Goals Discharge (Final Personal Goals and Risk Factors Review):    ITP Comments:     ITP Comments      03/12/15 1511           ITP Comments Initial ITP          Comments: Initial ITP

## 2015-03-13 DIAGNOSIS — I2510 Atherosclerotic heart disease of native coronary artery without angina pectoris: Secondary | ICD-10-CM | POA: Diagnosis not present

## 2015-03-15 DIAGNOSIS — I25118 Atherosclerotic heart disease of native coronary artery with other forms of angina pectoris: Secondary | ICD-10-CM | POA: Diagnosis not present

## 2015-03-15 DIAGNOSIS — E782 Mixed hyperlipidemia: Secondary | ICD-10-CM | POA: Diagnosis not present

## 2015-03-15 DIAGNOSIS — R0789 Other chest pain: Secondary | ICD-10-CM | POA: Diagnosis not present

## 2015-03-18 ENCOUNTER — Encounter: Payer: Self-pay | Admitting: Family Medicine

## 2015-03-20 DIAGNOSIS — Z9861 Coronary angioplasty status: Secondary | ICD-10-CM | POA: Diagnosis not present

## 2015-03-20 NOTE — Progress Notes (Signed)
Daily Session Note  Patient Details  Name: Hector Woodard MRN: [id number] Date of Birth: 01/13/1946 Referring Provider:  Callwood, Dwayne D, MD  Encounter Date: 03/20/2015  Check In:     Session Check In - 03/20/15 0918    Check-In   Staff Present Renee MacMillan, MS, ACSM CEP, Exercise Physiologist;Diane Wright, RN, BSN;Kendall McKinney, BS, Exercise Physiologist   ER physicians immediately available to respond to emergencies See telemetry face sheet for immediately available ER MD   Medication changes reported     No   Fall or balance concerns reported    No   Warm-up and Cool-down Performed on first and last piece of equipment   VAD Patient? No   Pain Assessment   Currently in Pain? No/denies           Exercise Prescription Changes - 03/20/15 0900    Response to Exercise   Comments First day of exercise! Patient was oriented to equipment and exercise prescription was discussed with patient. Patient was able to complete exercise in class today with no signs or symptoms.       Goals Met:  Exercise tolerated well No report of cardiac concerns or symptoms Strength training completed today  Goals Unmet:  Not Applicable  Goals Comments:    Dr. Mark Miller is Medical Director for [facility] and [facility]. 

## 2015-03-22 DIAGNOSIS — Z9861 Coronary angioplasty status: Secondary | ICD-10-CM

## 2015-03-22 NOTE — Progress Notes (Signed)
Daily Session Note  Patient Details  Name: Hector Woodard MRN: [id number] Date of Birth: 1945-08-17 Referring Provider:  Yolonda Kida, MD  Encounter Date: 03/22/2015  Check In:     Session Check In - 03/22/15 0958    Check-In   Staff Present Gerlene Burdock, RN, BSN;Caysen Whang Caprice Beaver, BS, Exercise Physiologist;Kelly Amedeo Plenty, BS, ACSM CEP, Exercise Physiologist   ER physicians immediately available to respond to emergencies See telemetry face sheet for immediately available ER MD   Medication changes reported     No   Fall or balance concerns reported    No   Warm-up and Cool-down Performed on first and last piece of equipment   VAD Patient? No   Pain Assessment   Currently in Pain? No/denies         Goals Met:  Independence with exercise equipment Exercise tolerated well No report of cardiac concerns or symptoms Strength training completed today  Goals Unmet:  Not Applicable  Goals Comments:    Dr. Emily Filbert is Medical Director for [place] and [facility].

## 2015-03-27 DIAGNOSIS — Z9861 Coronary angioplasty status: Secondary | ICD-10-CM

## 2015-03-27 NOTE — Progress Notes (Signed)
Daily Session Note  Patient Details  Name: Hector Woodard MRN: [id number] Date of Birth: 1945-10-28 Referring Provider:  Yolonda Kida, MD  Encounter Date: 03/27/2015  Check In:     Session Check In - 03/27/15 0936    Check-In   Staff Present Candiss Norse, MS, ACSM CEP, Exercise Physiologist;Diane Joya Gaskins, RN, Apolonio Schneiders, BS, Exercise Physiologist   ER physicians immediately available to respond to emergencies See telemetry face sheet for immediately available ER MD   Medication changes reported     No   Fall or balance concerns reported    No   Warm-up and Cool-down Performed on first and last piece of equipment   VAD Patient? No   Pain Assessment   Currently in Pain? No/denies         Goals Met:  Independence with exercise equipment Exercise tolerated well No report of cardiac concerns or symptoms Strength training completed today  Goals Unmet:  Not Applicable  Goals Comments:    Dr. Emily Filbert is Medical Director for [facility] and [facility].

## 2015-03-29 DIAGNOSIS — Z9861 Coronary angioplasty status: Secondary | ICD-10-CM | POA: Diagnosis not present

## 2015-03-29 NOTE — Progress Notes (Signed)
Daily Session Note  Patient Details  Name: Hector Woodard MRN: [id number] Date of Birth: May 14, 1945 Referring Provider:  Yolonda Kida, MD  Encounter Date: 03/29/2015  Check In:     Session Check In - 03/29/15 0858    Check-In   Staff Present Hessie Knows, BS, Exercise Physiologist;Carroll Enterkin, RN, BSN;Steven Way, BS, ACSM EP-C, Exercise Physiologist   ER physicians immediately available to respond to emergencies See telemetry face sheet for immediately available ER MD   Medication changes reported     No   Fall or balance concerns reported    No   Warm-up and Cool-down Performed on first and last piece of equipment   VAD Patient? No   Pain Assessment   Currently in Pain? No/denies         Goals Met:  Independence with exercise equipment Exercise tolerated well No report of cardiac concerns or symptoms Strength training completed today  Goals Unmet:  Not Applicable  Goals Comments:    Dr. Emily Filbert is Medical Director for [place] and [facility].

## 2015-03-30 ENCOUNTER — Encounter: Payer: Self-pay | Admitting: Family Medicine

## 2015-04-01 NOTE — Progress Notes (Signed)
Cardiac Individual Treatment Plan  Patient Details  Name: GEDDY PALUZZI MRN: [id number] Date of Birth: 16-Mar-1946 Referring Provider:  Yolonda Kida, MD  Initial Encounter Date:    Visit Diagnosis: S/P PTCA (percutaneous transluminal coronary angioplasty)  Patient's Home Medications on Admission:  Current outpatient prescriptions:  .  aspirin EC 81 MG tablet, Take 81 mg by mouth daily., Disp: , Rfl:  .  clopidogrel (PLAVIX) 75 MG tablet, Take 1 tablet (75 mg total) by mouth daily., Disp: 30 tablet, Rfl: 11 .  metoprolol succinate (TOPROL-XL) 25 MG 24 hr tablet, Take 1 tablet (25 mg total) by mouth daily., Disp: 30 tablet, Rfl: 11 .  Multiple Vitamin (MULTIVITAMIN) capsule, Take 1 capsule by mouth daily., Disp: , Rfl:  .  Omega-3 Fatty Acids (FISH OIL) 1000 MG CAPS, Take 1 capsule by mouth at bedtime., Disp: , Rfl:  .  rosuvastatin (CRESTOR) 40 MG tablet, Take 1 tablet (40 mg total) by mouth daily at 6 PM. (Patient not taking: Reported on 03/12/2015), Disp: 30 tablet, Rfl: 11  Past Medical History: Past Medical History  Diagnosis Date  . Hypercholesteremia   . Vertigo 2015  . PIN (prostatic intraepithelial neoplasia)   . Anginal pain ([place])   . Hypertension     Tobacco Use: History  Smoking status  . Never Smoker   Smokeless tobacco  . Never Used    Labs: Recent Review Flowsheet Data    Labs for ITP Cardiac and Pulmonary Rehab Latest Ref Rng 02/12/2015   Cholestrol 100 - 199 mg/dL 180   LDLCALC 0 - 99 mg/dL 92   HDL >39 mg/dL 70   Trlycerides 0 - 149 mg/dL 91       Exercise Target Goals:    Exercise Program Goal: Individual exercise prescription set with THRR, safety & activity barriers. Participant demonstrates ability to understand and report RPE using BORG scale, to self-measure pulse accurately, and to acknowledge the importance of the exercise prescription.  Exercise Prescription Goal: Starting with aerobic activity 30 plus minutes a day, 3 days per  week for initial exercise prescription. Provide home exercise prescription and guidelines that participant acknowledges understanding prior to discharge.  Activity Barriers & Risk Stratification:     Activity Barriers & Risk Stratification - 03/12/15 1522    Activity Barriers & Risk Stratification   Risk Stratification --  3 vessel disease >60% all three       6 Minute Walk:     6 Minute Walk      03/12/15 1421       6 Minute Walk   Phase Initial     Distance 1642 feet     Walk Time 6 minutes     Resting HR 68 bpm     Resting BP 124/72 mmHg     Max Ex. HR 98 bpm     Max Ex. BP 140/60 mmHg     RPE 7     Symptoms No        Initial Exercise Prescription:     Initial Exercise Prescription - 03/12/15 1400    Date of Initial Exercise Prescription   Date 03/12/15   Treadmill   MPH 3.1   Grade 0   Minutes 15   Bike   Level 1.5   Minutes 15   Recumbant Bike   Level 3   RPM 50   Minutes 15   NuStep   Level 4   Watts 60   Minutes 15   Arm Ergometer  Level 2   Watts 10   Minutes 15   Arm/Foot Ergometer   Level 2   Watts 15   Minutes 15   Cybex   Level 3   RPM 50   Minutes 15   Recumbant Elliptical   Level 2   Watts 30   Minutes 15   Elliptical   Level 1   Speed 3   Minutes 15   REL-XR   Level 4   Watts 60   Minutes 15   T5 Nustep   Level 3   Watts 30   Minutes 15   Biostep-RELP   Level 4   Watts 60   Minutes 15   Prescription Details   Frequency (times per week) 3   Duration Progress to 30 minutes of continuous aerobic without signs/symptoms of physical distress   Intensity   THRR REST +  30   Ratings of Perceived Exertion 11-15   Progression Continue progressive overload as per policy without signs/symptoms or physical distress.   Resistance Training   Training Prescription Yes   Weight 3   Reps 10-15      Exercise Prescription Changes:     Exercise Prescription Changes      03/12/15 1500 03/20/15 0900 03/29/15 1400        Response to Exercise   Blood Pressure (Admit) 124/72 mmHg  116/64 mmHg     Blood Pressure (Exercise) 140/60 mmHg  148/78 mmHg     Blood Pressure (Exit) 114/66 mmHg  110/62 mmHg     Heart Rate (Admit) 68 bpm  73 bpm     Heart Rate (Exercise) 98 bpm  109 bpm     Heart Rate (Exit) 66 bpm  78 bpm     Rating of Perceived Exertion (Exercise) 7  12     Symptoms none reported during walk  none     Comments  First day of exercise! Patient was oriented to equipment and exercise prescription was discussed with patient. Patient was able to complete exercise in class today with no signs or symptoms.       Duration   Progress to 50 minutes of aerobic without signs/symptoms of physical distress     Intensity   THRR unchanged     Progression   Continue progressive overload as per policy without signs/symptoms or physical distress.     Resistance Training   Training Prescription   Yes     Weight   10     Reps   10-15     Interval Training   Interval Training   No     Treadmill   MPH   3.5     Grade   5     Minutes   20     NuStep   Level   6     Watts   40     Minutes   15        Discharge Exercise Prescription (Final Exercise Prescription Changes):     Exercise Prescription Changes - 03/29/15 1400    Response to Exercise   Blood Pressure (Admit) 116/64 mmHg   Blood Pressure (Exercise) 148/78 mmHg   Blood Pressure (Exit) 110/62 mmHg   Heart Rate (Admit) 73 bpm   Heart Rate (Exercise) 109 bpm   Heart Rate (Exit) 78 bpm   Rating of Perceived Exertion (Exercise) 12   Symptoms none   Duration Progress to 50 minutes of aerobic without signs/symptoms of physical distress  Intensity THRR unchanged   Progression Continue progressive overload as per policy without signs/symptoms or physical distress.   Resistance Training   Training Prescription Yes   Weight 10   Reps 10-15   Interval Training   Interval Training No   Treadmill   MPH 3.5   Grade 5   Minutes 20   NuStep   Level 6    Watts 40   Minutes 15      Nutrition:  Target Goals: Understanding of nutrition guidelines, daily intake of sodium 1500mg , cholesterol 200mg , calories 30% from fat and 7% or less from saturated fats, daily to have 5 or more servings of fruits and vegetables.  Biometrics:     Pre Biometrics - 03/12/15 1424    Pre Biometrics   Height 6' (1.829 m)   Weight 196 lb (88.905 kg)   Waist Circumference 36 inches   Hip Circumference 42 inches   Waist to Hip Ratio 0.86 %   BMI (Calculated) 26.6       Nutrition Therapy Plan and Nutrition Goals:   Nutrition Discharge: Rate Your Plate Scores:     Rate Your Plate - [id number]    Rate Your Plate Scores   Pre Score 73   Pre Score % 81 %      Nutrition Goals Re-Evaluation:   Psychosocial: Target Goals: Acknowledge presence or absence of depression, maximize coping skills, provide positive support system. Participant is able to verbalize types and ability to use techniques and skills needed for reducing stress and depression.  Initial Review & Psychosocial Screening:     Initial Psych Review & Screening - 03/12/15 [place]? Yes   Comments Family lives close to home   Barriers   Psychosocial barriers to participate in program There are no identifiable barriers or psychosocial needs.;The patient should benefit from training in stress management and relaxation.   Screening Interventions   Interventions Encouraged to exercise      Quality of Life Scores:     Quality of Life - 03/12/15 1818    Quality of Life Scores   Health/Function Pre 24.63 %   Socioeconomic Pre 27.5 %   Psych/Spiritual Pre 27.93 %   Family Pre 30 %   GLOBAL Pre 26.69 %      PHQ-9:     Recent Review Flowsheet Data    Depression screen [facility] 2/9 03/12/2015 02/12/2015   Decreased Interest 0 0   Down, Depressed, Hopeless 0 0   PHQ - 2 Score 0 0   Altered sleeping 0 -   Tired, decreased energy 1 -   Change  in appetite 0 -   Feeling bad or failure about yourself  0 -   Trouble concentrating 0 -   Moving slowly or fidgety/restless 0 -   Suicidal thoughts 0 -   PHQ-9 Score 1 -   Difficult doing work/chores Not difficult at all -      Psychosocial Evaluation and Intervention:   Psychosocial Re-Evaluation:   Vocational Rehabilitation: Provide vocational rehab assistance to qualifying candidates.   Vocational Rehab Evaluation & Intervention:     Vocational Rehab - 03/12/15 1323    Initial Vocational Rehab Evaluation & Intervention   Assessment shows need for Vocational Rehabilitation No      Education: Education Goals: Education classes will be provided on a weekly basis, covering required topics. Participant will state understanding/return demonstration of topics presented.  Learning Barriers/Preferences:  Learning Barriers/Preferences - 03/12/15 1322    Learning Barriers/Preferences   Learning Barriers None   Learning Preferences None      Education Topics: General Nutrition Guidelines/Fats and Fiber: -Group instruction provided by verbal, written material, models and posters to present the general guidelines for heart healthy nutrition. Gives an explanation and review of dietary fats and fiber.   Controlling Sodium/Reading Food Labels: -Group verbal and written material supporting the discussion of sodium use in heart healthy nutrition. Review and explanation with models, verbal and written materials for utilization of the food label.   Exercise Physiology & Risk Factors: - Group verbal and written instruction with models to review the exercise physiology of the cardiovascular system and associated critical values. Details cardiovascular disease risk factors and the goals associated with each risk factor.   Aerobic Exercise & Resistance Training: - Gives group verbal and written discussion on the health impact of inactivity. On the components of aerobic and  resistive training programs and the benefits of this training and how to safely progress through these programs.   Flexibility, Balance, General Exercise Guidelines: - Provides group verbal and written instruction on the benefits of flexibility and balance training programs. Provides general exercise guidelines with specific guidelines to those with heart or lung disease. Demonstration and skill practice provided.   Stress Management: - Provides group verbal and written instruction about the health risks of elevated stress, cause of high stress, and healthy ways to reduce stress.   Depression: - Provides group verbal and written instruction on the correlation between heart/lung disease and depressed mood, treatment options, and the stigmas associated with seeking treatment.   Anatomy & Physiology of the Heart: - Group verbal and written instruction and models provide basic cardiac anatomy and physiology, with the coronary electrical and arterial systems. Review of: AMI, Angina, Valve disease, Heart Failure, Cardiac Arrhythmia, Pacemakers, and the ICD.   Cardiac Procedures: - Group verbal and written instruction and models to describe the testing methods done to diagnose heart disease. Reviews the outcomes of the test results. Describes the treatment choices: Medical Management, Angioplasty, or Coronary Bypass Surgery.          Cardiac Rehab from 03/20/2015 in [facility] Cardiac and Pulmonary Rehab   Date  03/20/15   Educator  DW   Instruction Review Code  2- meets goals/outcomes      Cardiac Medications: - Group verbal and written instruction to review commonly prescribed medications for heart disease. Reviews the medication, class of the drug, and side effects. Includes the steps to properly store meds and maintain the prescription regimen.   Go Sex-Intimacy & Heart Disease, Get SMART - Goal Setting: - Group verbal and written instruction through game format to discuss heart disease and  the return to sexual intimacy. Provides group verbal and written material to discuss and apply goal setting through the application of the S.M.A.R.T. Method.      Cardiac Rehab from 03/20/2015 in [facility] Cardiac and Pulmonary Rehab   Date  03/20/15   Educator  DW   Instruction Review Code  2- meets goals/outcomes      Other Matters of the Heart: - Provides group verbal, written materials and models to describe Heart Failure, Angina, Valve Disease, and Diabetes in the realm of heart disease. Includes description of the disease process and treatment options available to the cardiac patient.   Exercise & Equipment Safety: - Individual verbal instruction and demonstration of equipment use and safety with use of the equipment.  Cardiac Rehab from 03/20/2015 in [facility] Cardiac and Pulmonary Rehab   Date  03/12/15   Educator  SB   Instruction Review Code  2- meets goals/outcomes      Infection Prevention: - Provides verbal and written material to individual with discussion of infection control including proper hand washing and proper equipment cleaning during exercise session.      Cardiac Rehab from 03/20/2015 in [facility] Cardiac and Pulmonary Rehab   Date  03/12/15   Educator  Sb   Instruction Review Code  2- meets goals/outcomes      Falls Prevention: - Provides verbal and written material to individual with discussion of falls prevention and safety.      Cardiac Rehab from 03/20/2015 in [facility] Cardiac and Pulmonary Rehab   Date  03/12/15   Educator  SB   Instruction Review Code  2- meets goals/outcomes      Diabetes: - Individual verbal and written instruction to review signs/symptoms of diabetes, desired ranges of glucose level fasting, after meals and with exercise. Advice that pre and post exercise glucose checks will be done for 3 sessions at entry of program.    Knowledge Questionnaire Score:     Knowledge Questionnaire Score - 03/12/15 1322    Knowledge Questionnaire Score    Pre Score 22/28      Personal Goals and Risk Factors at Admission:     Personal Goals and Risk Factors at Admission - 03/29/15 1151    Personal Goals and Risk Factors on Admission    Weight Management Yes   Intervention Learn and follow the exercise and diet guidelines while in the program. Utilize the nutrition and education classes to help gain knowledge of the diet and exercise expectations in the program      Personal Goals and Risk Factors Review:      Goals and Risk Factor Review      03/28/15 0856 03/29/15 1151 03/29/15 1152       Weight Management   Goals Progress/Improvement seen  Yes Yes     Comments   Caston wants to lose a couple of lbs so it will be easier to run a half marathon again.      Increase Aerobic Exercise and Physical Activity   Goals Progress/Improvement seen  Yes       Comments Kyel has been progressing very well and very quickly due to his history of being physically active. He was a runner before his heart procedure and wants to get back to that. He can continuously exercise for the entire class time and in order to continue to challenge him we have increaed his TM speed and NS workload. He went for a walk/jog over Christmas weekend with his wife and averaged an 11.5 min/mile over 2 miles. He is going to continue accumulating exercise minutes at home outside of class.        Hypertension   Goal   Participant will see blood pressure controlled within the values of 140/45mm/Hg or within value directed by their physician.     Progress seen toward goals   Yes     Comments   Blood pressure is good at 120/70 on his blood pressure medicine.      Abnormal Lipids   Progress seen towards goals   Unknown     Comments   Will have follow up blood work with his MD.         Personal Goals Discharge (Final Personal Goals and Risk Factors Review):  Goals and Risk Factor Review - 03/29/15 1152    Weight Management   Goals Progress/Improvement seen Yes    Comments Sampson wants to lose a couple of lbs so it will be easier to run a half marathon again.    Hypertension   Goal Participant will see blood pressure controlled within the values of 140/13mm/Hg or within value directed by their physician.   Progress seen toward goals Yes   Comments Blood pressure is good at 120/70 on his blood pressure medicine.    Abnormal Lipids   Progress seen towards goals Unknown   Comments Will have follow up blood work with his MD.       ITP Comments:     ITP Comments      03/12/15 1511 04/01/15 1335         ITP Comments Initial ITP Ready for 30 day review.  Continue with ITP         Comments:

## 2015-04-01 NOTE — Progress Notes (Signed)
Cardiac Individual Treatment Plan  Patient Details  Name: KIRKPATRICK MONCLOVA MRN: [id number] Date of Birth: 1945/09/29 Referring Provider:  Yolonda Kida, MD  Initial Encounter Date:    Visit Diagnosis: S/P PTCA (percutaneous transluminal coronary angioplasty)  Patient's Home Medications on Admission:  Current outpatient prescriptions:  .  aspirin EC 81 MG tablet, Take 81 mg by mouth daily., Disp: , Rfl:  .  clopidogrel (PLAVIX) 75 MG tablet, Take 1 tablet (75 mg total) by mouth daily., Disp: 30 tablet, Rfl: 11 .  metoprolol succinate (TOPROL-XL) 25 MG 24 hr tablet, Take 1 tablet (25 mg total) by mouth daily., Disp: 30 tablet, Rfl: 11 .  Multiple Vitamin (MULTIVITAMIN) capsule, Take 1 capsule by mouth daily., Disp: , Rfl:  .  Omega-3 Fatty Acids (FISH OIL) 1000 MG CAPS, Take 1 capsule by mouth at bedtime., Disp: , Rfl:  .  rosuvastatin (CRESTOR) 40 MG tablet, Take 1 tablet (40 mg total) by mouth daily at 6 PM. (Patient not taking: Reported on 03/12/2015), Disp: 30 tablet, Rfl: 11  Past Medical History: Past Medical History  Diagnosis Date  . Hypercholesteremia   . Vertigo 2015  . PIN (prostatic intraepithelial neoplasia)   . Anginal pain ([place])   . Hypertension     Tobacco Use: History  Smoking status  . Never Smoker   Smokeless tobacco  . Never Used    Labs: Recent Review Flowsheet Data    Labs for ITP Cardiac and Pulmonary Rehab Latest Ref Rng 02/12/2015   Cholestrol 100 - 199 mg/dL 180   LDLCALC 0 - 99 mg/dL 92   HDL >39 mg/dL 70   Trlycerides 0 - 149 mg/dL 91       Exercise Target Goals:    Exercise Program Goal: Individual exercise prescription set with THRR, safety & activity barriers. Participant demonstrates ability to understand and report RPE using BORG scale, to self-measure pulse accurately, and to acknowledge the importance of the exercise prescription.  Exercise Prescription Goal: Starting with aerobic activity 30 plus minutes a day, 3 days per  week for initial exercise prescription. Provide home exercise prescription and guidelines that participant acknowledges understanding prior to discharge.  Activity Barriers & Risk Stratification:     Activity Barriers & Risk Stratification - 03/12/15 1522    Activity Barriers & Risk Stratification   Risk Stratification --  3 vessel disease >60% all three       6 Minute Walk:     6 Minute Walk      03/12/15 1421       6 Minute Walk   Phase Initial     Distance 1642 feet     Walk Time 6 minutes     Resting HR 68 bpm     Resting BP 124/72 mmHg     Max Ex. HR 98 bpm     Max Ex. BP 140/60 mmHg     RPE 7     Symptoms No        Initial Exercise Prescription:     Initial Exercise Prescription - 03/12/15 1400    Date of Initial Exercise Prescription   Date 03/12/15   Treadmill   MPH 3.1   Grade 0   Minutes 15   Bike   Level 1.5   Minutes 15   Recumbant Bike   Level 3   RPM 50   Minutes 15   NuStep   Level 4   Watts 60   Minutes 15   Arm Ergometer  Level 2   Watts 10   Minutes 15   Arm/Foot Ergometer   Level 2   Watts 15   Minutes 15   Cybex   Level 3   RPM 50   Minutes 15   Recumbant Elliptical   Level 2   Watts 30   Minutes 15   Elliptical   Level 1   Speed 3   Minutes 15   REL-XR   Level 4   Watts 60   Minutes 15   T5 Nustep   Level 3   Watts 30   Minutes 15   Biostep-RELP   Level 4   Watts 60   Minutes 15   Prescription Details   Frequency (times per week) 3   Duration Progress to 30 minutes of continuous aerobic without signs/symptoms of physical distress   Intensity   THRR REST +  30   Ratings of Perceived Exertion 11-15   Progression Continue progressive overload as per policy without signs/symptoms or physical distress.   Resistance Training   Training Prescription Yes   Weight 3   Reps 10-15      Exercise Prescription Changes:     Exercise Prescription Changes      03/12/15 1500 03/20/15 0900 03/29/15 1400        Response to Exercise   Blood Pressure (Admit) 124/72 mmHg  116/64 mmHg     Blood Pressure (Exercise) 140/60 mmHg  148/78 mmHg     Blood Pressure (Exit) 114/66 mmHg  110/62 mmHg     Heart Rate (Admit) 68 bpm  73 bpm     Heart Rate (Exercise) 98 bpm  109 bpm     Heart Rate (Exit) 66 bpm  78 bpm     Rating of Perceived Exertion (Exercise) 7  12     Symptoms none reported during walk  none     Comments  First day of exercise! Patient was oriented to equipment and exercise prescription was discussed with patient. Patient was able to complete exercise in class today with no signs or symptoms.       Duration   Progress to 50 minutes of aerobic without signs/symptoms of physical distress     Intensity   THRR unchanged     Progression   Continue progressive overload as per policy without signs/symptoms or physical distress.     Resistance Training   Training Prescription   Yes     Weight   10     Reps   10-15     Interval Training   Interval Training   No     Treadmill   MPH   3.5     Grade   5     Minutes   20     NuStep   Level   6     Watts   40     Minutes   15        Discharge Exercise Prescription (Final Exercise Prescription Changes):     Exercise Prescription Changes - 03/29/15 1400    Response to Exercise   Blood Pressure (Admit) 116/64 mmHg   Blood Pressure (Exercise) 148/78 mmHg   Blood Pressure (Exit) 110/62 mmHg   Heart Rate (Admit) 73 bpm   Heart Rate (Exercise) 109 bpm   Heart Rate (Exit) 78 bpm   Rating of Perceived Exertion (Exercise) 12   Symptoms none   Duration Progress to 50 minutes of aerobic without signs/symptoms of physical distress  Intensity THRR unchanged   Progression Continue progressive overload as per policy without signs/symptoms or physical distress.   Resistance Training   Training Prescription Yes   Weight 10   Reps 10-15   Interval Training   Interval Training No   Treadmill   MPH 3.5   Grade 5   Minutes 20   NuStep   Level 6    Watts 40   Minutes 15      Nutrition:  Target Goals: Understanding of nutrition guidelines, daily intake of sodium 1500mg , cholesterol 200mg , calories 30% from fat and 7% or less from saturated fats, daily to have 5 or more servings of fruits and vegetables.  Biometrics:     Pre Biometrics - 03/12/15 1424    Pre Biometrics   Height 6' (1.829 m)   Weight 196 lb (88.905 kg)   Waist Circumference 36 inches   Hip Circumference 42 inches   Waist to Hip Ratio 0.86 %   BMI (Calculated) 26.6       Nutrition Therapy Plan and Nutrition Goals:   Nutrition Discharge: Rate Your Plate Scores:     Rate Your Plate - [id number]    Rate Your Plate Scores   Pre Score 73   Pre Score % 81 %      Nutrition Goals Re-Evaluation:   Psychosocial: Target Goals: Acknowledge presence or absence of depression, maximize coping skills, provide positive support system. Participant is able to verbalize types and ability to use techniques and skills needed for reducing stress and depression.  Initial Review & Psychosocial Screening:     Initial Psych Review & Screening - 03/12/15 [place]? Yes   Comments Family lives close to home   Barriers   Psychosocial barriers to participate in program There are no identifiable barriers or psychosocial needs.;The patient should benefit from training in stress management and relaxation.   Screening Interventions   Interventions Encouraged to exercise      Quality of Life Scores:     Quality of Life - 03/12/15 1818    Quality of Life Scores   Health/Function Pre 24.63 %   Socioeconomic Pre 27.5 %   Psych/Spiritual Pre 27.93 %   Family Pre 30 %   GLOBAL Pre 26.69 %      PHQ-9:     Recent Review Flowsheet Data    Depression screen [facility] 2/9 03/12/2015 02/12/2015   Decreased Interest 0 0   Down, Depressed, Hopeless 0 0   PHQ - 2 Score 0 0   Altered sleeping 0 -   Tired, decreased energy 1 -   Change  in appetite 0 -   Feeling bad or failure about yourself  0 -   Trouble concentrating 0 -   Moving slowly or fidgety/restless 0 -   Suicidal thoughts 0 -   PHQ-9 Score 1 -   Difficult doing work/chores Not difficult at all -      Psychosocial Evaluation and Intervention:   Psychosocial Re-Evaluation:   Vocational Rehabilitation: Provide vocational rehab assistance to qualifying candidates.   Vocational Rehab Evaluation & Intervention:     Vocational Rehab - 03/12/15 1323    Initial Vocational Rehab Evaluation & Intervention   Assessment shows need for Vocational Rehabilitation No      Education: Education Goals: Education classes will be provided on a weekly basis, covering required topics. Participant will state understanding/return demonstration of topics presented.  Learning Barriers/Preferences:  Learning Barriers/Preferences - 03/12/15 1322    Learning Barriers/Preferences   Learning Barriers None   Learning Preferences None      Education Topics: General Nutrition Guidelines/Fats and Fiber: -Group instruction provided by verbal, written material, models and posters to present the general guidelines for heart healthy nutrition. Gives an explanation and review of dietary fats and fiber.   Controlling Sodium/Reading Food Labels: -Group verbal and written material supporting the discussion of sodium use in heart healthy nutrition. Review and explanation with models, verbal and written materials for utilization of the food label.   Exercise Physiology & Risk Factors: - Group verbal and written instruction with models to review the exercise physiology of the cardiovascular system and associated critical values. Details cardiovascular disease risk factors and the goals associated with each risk factor.   Aerobic Exercise & Resistance Training: - Gives group verbal and written discussion on the health impact of inactivity. On the components of aerobic and  resistive training programs and the benefits of this training and how to safely progress through these programs.   Flexibility, Balance, General Exercise Guidelines: - Provides group verbal and written instruction on the benefits of flexibility and balance training programs. Provides general exercise guidelines with specific guidelines to those with heart or lung disease. Demonstration and skill practice provided.   Stress Management: - Provides group verbal and written instruction about the health risks of elevated stress, cause of high stress, and healthy ways to reduce stress.   Depression: - Provides group verbal and written instruction on the correlation between heart/lung disease and depressed mood, treatment options, and the stigmas associated with seeking treatment.   Anatomy & Physiology of the Heart: - Group verbal and written instruction and models provide basic cardiac anatomy and physiology, with the coronary electrical and arterial systems. Review of: AMI, Angina, Valve disease, Heart Failure, Cardiac Arrhythmia, Pacemakers, and the ICD.   Cardiac Procedures: - Group verbal and written instruction and models to describe the testing methods done to diagnose heart disease. Reviews the outcomes of the test results. Describes the treatment choices: Medical Management, Angioplasty, or Coronary Bypass Surgery.          Cardiac Rehab from 03/20/2015 in [facility] Cardiac and Pulmonary Rehab   Date  03/20/15   Educator  DW   Instruction Review Code  2- meets goals/outcomes      Cardiac Medications: - Group verbal and written instruction to review commonly prescribed medications for heart disease. Reviews the medication, class of the drug, and side effects. Includes the steps to properly store meds and maintain the prescription regimen.   Go Sex-Intimacy & Heart Disease, Get SMART - Goal Setting: - Group verbal and written instruction through game format to discuss heart disease and  the return to sexual intimacy. Provides group verbal and written material to discuss and apply goal setting through the application of the S.M.A.R.T. Method.      Cardiac Rehab from 03/20/2015 in [facility] Cardiac and Pulmonary Rehab   Date  03/20/15   Educator  DW   Instruction Review Code  2- meets goals/outcomes      Other Matters of the Heart: - Provides group verbal, written materials and models to describe Heart Failure, Angina, Valve Disease, and Diabetes in the realm of heart disease. Includes description of the disease process and treatment options available to the cardiac patient.   Exercise & Equipment Safety: - Individual verbal instruction and demonstration of equipment use and safety with use of the equipment.  Cardiac Rehab from 03/20/2015 in [facility] Cardiac and Pulmonary Rehab   Date  03/12/15   Educator  SB   Instruction Review Code  2- meets goals/outcomes      Infection Prevention: - Provides verbal and written material to individual with discussion of infection control including proper hand washing and proper equipment cleaning during exercise session.      Cardiac Rehab from 03/20/2015 in [facility] Cardiac and Pulmonary Rehab   Date  03/12/15   Educator  Sb   Instruction Review Code  2- meets goals/outcomes      Falls Prevention: - Provides verbal and written material to individual with discussion of falls prevention and safety.      Cardiac Rehab from 03/20/2015 in [facility] Cardiac and Pulmonary Rehab   Date  03/12/15   Educator  SB   Instruction Review Code  2- meets goals/outcomes      Diabetes: - Individual verbal and written instruction to review signs/symptoms of diabetes, desired ranges of glucose level fasting, after meals and with exercise. Advice that pre and post exercise glucose checks will be done for 3 sessions at entry of program.    Knowledge Questionnaire Score:     Knowledge Questionnaire Score - 03/12/15 1322    Knowledge Questionnaire Score    Pre Score 22/28      Personal Goals and Risk Factors at Admission:     Personal Goals and Risk Factors at Admission - 03/29/15 1151    Personal Goals and Risk Factors on Admission    Weight Management Yes   Intervention Learn and follow the exercise and diet guidelines while in the program. Utilize the nutrition and education classes to help gain knowledge of the diet and exercise expectations in the program      Personal Goals and Risk Factors Review:      Goals and Risk Factor Review      03/28/15 0856 03/29/15 1151 03/29/15 1152       Weight Management   Goals Progress/Improvement seen  Yes Yes     Comments   Darrelle wants to lose a couple of lbs so it will be easier to run a half marathon again.      Increase Aerobic Exercise and Physical Activity   Goals Progress/Improvement seen  Yes       Comments Sonja has been progressing very well and very quickly due to his history of being physically active. He was a runner before his heart procedure and wants to get back to that. He can continuously exercise for the entire class time and in order to continue to challenge him we have increaed his TM speed and NS workload. He went for a walk/jog over Christmas weekend with his wife and averaged an 11.5 min/mile over 2 miles. He is going to continue accumulating exercise minutes at home outside of class.        Hypertension   Goal   Participant will see blood pressure controlled within the values of 140/41mm/Hg or within value directed by their physician.     Progress seen toward goals   Yes     Comments   Blood pressure is good at 120/70 on his blood pressure medicine.      Abnormal Lipids   Progress seen towards goals   Unknown     Comments   Will have follow up blood work with his MD.         Personal Goals Discharge (Final Personal Goals and Risk Factors Review):  Goals and Risk Factor Review - 03/29/15 1152    Weight Management   Goals Progress/Improvement seen Yes    Comments Trestan wants to lose a couple of lbs so it will be easier to run a half marathon again.    Hypertension   Goal Participant will see blood pressure controlled within the values of 140/31mm/Hg or within value directed by their physician.   Progress seen toward goals Yes   Comments Blood pressure is good at 120/70 on his blood pressure medicine.    Abnormal Lipids   Progress seen towards goals Unknown   Comments Will have follow up blood work with his MD.       ITP Comments:     ITP Comments      03/12/15 1511 04/01/15 1335         ITP Comments Initial ITP Ready for 30 day review.  Continue with ITP         Comments:

## 2015-04-03 ENCOUNTER — Encounter: Payer: Commercial Managed Care - HMO | Attending: Internal Medicine | Admitting: *Deleted

## 2015-04-03 ENCOUNTER — Telehealth: Payer: Self-pay

## 2015-04-03 DIAGNOSIS — Z9861 Coronary angioplasty status: Secondary | ICD-10-CM | POA: Diagnosis not present

## 2015-04-03 NOTE — Telephone Encounter (Signed)
-----   Message from Valerie Roys, DO sent at 03/30/2015  3:37 PM EST ----- Call regarding e-message from 12/18

## 2015-04-03 NOTE — Progress Notes (Signed)
Daily Session Note  Patient Details  Name: Hector Woodard MRN: [id number] Date of Birth: Mar 24, 1946 Referring Provider:  Yolonda Kida, MD  Encounter Date: 04/03/2015  Check In:     Session Check In - 04/03/15 1015    Check-In   Staff Present Candiss Norse, MS, ACSM CEP, Exercise Physiologist;Diane Joya Gaskins, RN, Apolonio Schneiders, BS, Exercise Physiologist   ER physicians immediately available to respond to emergencies See telemetry face sheet for immediately available ER MD   Medication changes reported     No   Fall or balance concerns reported    No   Warm-up and Cool-down Performed on first and last piece of equipment   VAD Patient? No   Pain Assessment   Currently in Pain? No/denies   Multiple Pain Sites No           Exercise Prescription Changes - 04/03/15 1000    Exercise Review   Progression Yes   Response to Exercise   Symptoms none   Comments Met with Jonni Sanger about changing his THR to 40-85% of HRR.He has been instructed to use this THR when he exercises at home.     Duration Progress to 50 minutes of aerobic without signs/symptoms of physical distress   Intensity Other (comment)  40-85% HRR 107-148bpm   Progression Continue progressive overload as per policy without signs/symptoms or physical distress.   Resistance Training   Training Prescription Yes   Weight 10   Reps 10-15   Interval Training   Interval Training No   Treadmill   MPH 3.5   Grade 5   Minutes 20   NuStep   Level 6   Watts 40   Minutes 15      Goals Met:  Independence with exercise equipment Exercise tolerated well No report of cardiac concerns or symptoms Strength training completed today  Goals Unmet:  Not Applicable  Goals Comments: Patient completed exercise prescription and all exercise goals during rehab session. The exercise was tolerated well and the patient is progressing in the program.    Dr. Emily Filbert is Medical Director for [place] and [facility].

## 2015-04-04 NOTE — Addendum Note (Signed)
Addended by: Lynford Humphrey on: 04/04/2015 11:22 AM   Modules accepted: Orders

## 2015-04-09 ENCOUNTER — Ambulatory Visit: Payer: Medicare Other | Admitting: Cardiovascular Disease

## 2015-04-10 DIAGNOSIS — Z9861 Coronary angioplasty status: Secondary | ICD-10-CM | POA: Diagnosis not present

## 2015-04-10 NOTE — Progress Notes (Signed)
Daily Session Note  Patient Details  Name: Hector Woodard MRN: [id number] Date of Birth: 1946-03-04 Referring Provider:  Yolonda Kida, MD  Encounter Date: 04/10/2015  Check In:     Session Check In - 04/10/15 1100    Check-In   Staff Present Heath Lark, RN, BSN, CCRP;Renee Dillard [place], MS, ACSM CEP, Exercise Physiologist;Hoyte Ziebell, BS, ACSM EP-C, Exercise Physiologist   ER physicians immediately available to respond to emergencies See telemetry face sheet for immediately available ER MD   Medication changes reported     No   Fall or balance concerns reported    No   Warm-up and Cool-down Performed on first and last piece of equipment   VAD Patient? No   Pain Assessment   Currently in Pain? No/denies         Goals Met:  Proper associated with RPD/PD & O2 Sat Exercise tolerated well No report of cardiac concerns or symptoms Strength training completed today  Goals Unmet:  Not Applicable  Goals Comments:    Dr. Emily Filbert is Medical Director for [place] and [facility].

## 2015-04-12 DIAGNOSIS — Z9861 Coronary angioplasty status: Secondary | ICD-10-CM

## 2015-04-12 NOTE — Progress Notes (Signed)
Daily Session Note  Patient Details  Name: JERRIE GULLO MRN: [id number] Date of Birth: 01-25-1946 Referring Provider:  Guadalupe Maple, MD  Encounter Date: 04/12/2015  Check In:     Session Check In - 04/12/15 0854    Check-In   Staff Present Gerlene Burdock, RN, BSN;Kendall Caprice Beaver, BS, Exercise Physiologist;Derold Dorsch, BS, ACSM EP-C, Exercise Physiologist   ER physicians immediately available to respond to emergencies See telemetry face sheet for immediately available ER MD   Medication changes reported     No   Fall or balance concerns reported    No   Warm-up and Cool-down Performed on first and last piece of equipment   VAD Patient? No   Pain Assessment   Currently in Pain? No/denies         Goals Met:  Proper associated with RPD/PD & O2 Sat Exercise tolerated well No report of cardiac concerns or symptoms Strength training completed today  Goals Unmet:  Not Applicable  Goals Comments:   Dr. Emily Filbert is Medical Director for [place] and [facility].

## 2015-04-17 DIAGNOSIS — Z9861 Coronary angioplasty status: Secondary | ICD-10-CM

## 2015-04-17 NOTE — Progress Notes (Signed)
Daily Session Note  Patient Details  Name: ILIYA SPIVACK MRN: [id number] Date of Birth: 1946-01-27 Referring Provider:  Yolonda Kida, MD  Encounter Date: 04/17/2015  Check In:     Session Check In - 04/17/15 1023    Check-In   Staff Present Candiss Norse, MS, ACSM CEP, Exercise Physiologist;Diane Joya Gaskins, RN, Apolonio Schneiders, BS, Exercise Physiologist   ER physicians immediately available to respond to emergencies See telemetry face sheet for immediately available ER MD   Medication changes reported     No   Fall or balance concerns reported    No   Warm-up and Cool-down Performed on first and last piece of equipment   VAD Patient? No   Pain Assessment   Currently in Pain? No/denies         Goals Met:  Independence with exercise equipment Exercise tolerated well No report of cardiac concerns or symptoms Strength training completed today  Goals Unmet:  Not Applicable  Goals Comments:    Dr. Emily Filbert is Medical Director for [place] and [facility].

## 2015-04-19 DIAGNOSIS — Z9861 Coronary angioplasty status: Secondary | ICD-10-CM | POA: Diagnosis not present

## 2015-04-19 NOTE — Progress Notes (Signed)
Daily Session Note  Patient Details  Name: Hector Woodard MRN: [id number] Date of Birth: 04/25/1945 Referring Provider:  Guadalupe Maple, MD  Encounter Date: 04/19/2015  Check In:     Session Check In - 04/19/15 0854    Check-In   Staff Present Hessie Knows, BS, Exercise Physiologist;Carroll Enterkin, RN, BSN;Laurel Smeltz, BS, ACSM EP-C, Exercise Physiologist   ER physicians immediately available to respond to emergencies See telemetry face sheet for immediately available ER MD   Medication changes reported     No   Fall or balance concerns reported    No   Warm-up and Cool-down Performed on first and last piece of equipment   VAD Patient? No   Pain Assessment   Currently in Pain? No/denies         Goals Met:  Proper associated with RPD/PD & O2 Sat Exercise tolerated well Personal goals reviewed No report of cardiac concerns or symptoms Strength training completed today  Goals Unmet:  Not Applicable  Goals Comments:    Dr. Emily Filbert is Medical Director for [place] and [facility].

## 2015-04-23 ENCOUNTER — Telehealth: Payer: Self-pay | Admitting: Family Medicine

## 2015-04-23 ENCOUNTER — Encounter: Payer: Self-pay | Admitting: Family Medicine

## 2015-04-23 NOTE — Telephone Encounter (Signed)
Please get patient scheduled for appointment due to current issues. Thank you.

## 2015-04-24 ENCOUNTER — Other Ambulatory Visit: Payer: Commercial Managed Care - HMO

## 2015-04-24 ENCOUNTER — Other Ambulatory Visit: Payer: Self-pay | Admitting: Family Medicine

## 2015-04-24 DIAGNOSIS — E785 Hyperlipidemia, unspecified: Secondary | ICD-10-CM

## 2015-04-24 DIAGNOSIS — Z9861 Coronary angioplasty status: Secondary | ICD-10-CM | POA: Diagnosis not present

## 2015-04-24 DIAGNOSIS — R748 Abnormal levels of other serum enzymes: Secondary | ICD-10-CM | POA: Diagnosis not present

## 2015-04-24 NOTE — Telephone Encounter (Signed)
Pt did not want to schedule appt at this point.  He said he would call us if he felt he needed one.

## 2015-04-24 NOTE — Progress Notes (Signed)
Daily Session Note  Patient Details  Name: Hector Woodard MRN: [id number] Date of Birth: July 06, 1945 Referring Provider:  Yolonda Kida, MD  Encounter Date: 04/24/2015  Check In:     Session Check In - 04/24/15 0935    Check-In   Staff Present Candiss Norse, MS, ACSM CEP, Exercise Physiologist;Mico Spark [place]is [place], Exercise Physiologist;Diane Joya Gaskins, RN, BSN   ER physicians immediately available to respond to emergencies See telemetry face sheet for immediately available ER MD   Medication changes reported     No   Fall or balance concerns reported    No   Warm-up and Cool-down Performed on first and last piece of equipment   VAD Patient? No   Pain Assessment   Currently in Pain? No/denies   Multiple Pain Sites No         Goals Met:  Independence with exercise equipment Exercise tolerated well No report of cardiac concerns or symptoms Strength training completed today  Goals Unmet:  Not Applicable  Goals Comments:    Dr. Emily Filbert is Medical Director for [place] and [facility].

## 2015-04-25 LAB — COMPREHENSIVE METABOLIC PANEL
ALBUMIN: 4.3 g/dL (ref 3.6–4.8)
ALT: 20 IU/L (ref 0–44)
AST: 23 IU/L (ref 0–40)
Albumin/Globulin Ratio: 1.8 (ref 1.1–2.5)
Alkaline Phosphatase: 45 IU/L (ref 39–117)
BUN / CREAT RATIO: 14 (ref 10–22)
BUN: 18 mg/dL (ref 8–27)
Bilirubin Total: 0.6 mg/dL (ref 0.0–1.2)
CALCIUM: 9.7 mg/dL (ref 8.6–10.2)
CO2: 24 mmol/L (ref 18–29)
CREATININE: 1.31 mg/dL — AB (ref 0.76–1.27)
Chloride: 104 mmol/L (ref 96–106)
GFR calc Af Amer: 65 mL/min/{1.73_m2} (ref >59)
GFR, EST NON AFRICAN AMERICAN: 56 mL/min/{1.73_m2} — AB (ref >59)
GLOBULIN, TOTAL: 2.4 g/dL (ref 1.5–4.5)
GLUCOSE: 97 mg/dL (ref 65–99)
Potassium: 4.5 mmol/L (ref 3.5–5.2)
SODIUM: 142 mmol/L (ref 134–144)
TOTAL PROTEIN: 6.7 g/dL (ref 6.0–8.5)

## 2015-04-25 LAB — NMR, LIPOPROFILE
Cholesterol: 151 mg/dL (ref 100–199)
HDL CHOLESTEROL BY NMR: 63 mg/dL (ref >39)
HDL PARTICLE NUMBER: 36.5 umol/L (ref >=30.5)
LDL Particle Number: 923 nmol/L (ref <1000)
LDL Size: 20.8 nm (ref >20.5)
LDL-C: 73 mg/dL (ref 0–99)
SMALL LDL PARTICLE NUMBER: 332 nmol/L (ref <=527)
Triglycerides by NMR: 74 mg/dL (ref 0–149)

## 2015-04-26 DIAGNOSIS — Z9861 Coronary angioplasty status: Secondary | ICD-10-CM

## 2015-04-26 NOTE — Progress Notes (Signed)
Cardiac Individual Treatment Plan  Patient Details  Name: Hector Woodard MRN: [id number] Date of Birth: 1945-09-24 Referring Provider:  Yolonda Kida, MD  Initial Encounter Date:    Visit Diagnosis: S/P PTCA (percutaneous transluminal coronary angioplasty)  Patient's Home Medications on Admission:  Current outpatient prescriptions:  .  aspirin EC 81 MG tablet, Take 81 mg by mouth daily., Disp: , Rfl:  .  clopidogrel (PLAVIX) 75 MG tablet, Take 1 tablet (75 mg total) by mouth daily., Disp: 30 tablet, Rfl: 11 .  metoprolol succinate (TOPROL-XL) 25 MG 24 hr tablet, Take 1 tablet (25 mg total) by mouth daily., Disp: 30 tablet, Rfl: 11 .  Multiple Vitamin (MULTIVITAMIN) capsule, Take 1 capsule by mouth daily., Disp: , Rfl:  .  Omega-3 Fatty Acids (FISH OIL) 1000 MG CAPS, Take 1 capsule by mouth at bedtime., Disp: , Rfl:  .  rosuvastatin (CRESTOR) 40 MG tablet, Take 1 tablet (40 mg total) by mouth daily at 6 PM. (Patient not taking: Reported on 03/12/2015), Disp: 30 tablet, Rfl: 11  Past Medical History: Past Medical History  Diagnosis Date  . Hypercholesteremia   . Vertigo 2015  . PIN (prostatic intraepithelial neoplasia)   . Anginal pain ([place])   . Hypertension     Tobacco Use: History  Smoking status  . Never Smoker   Smokeless tobacco  . Never Used    Labs: Recent Review Flowsheet Data    Labs for ITP Cardiac and Pulmonary Rehab Latest Ref Rng 02/12/2015 04/24/2015 04/24/2015   Cholestrol - 180 151 CANCELED   LDLCALC 0 - 99 mg/dL 92 - -   HDL - 70 63 CANCELED   Trlycerides - 91 74 CANCELED       Exercise Target Goals:    Exercise Program Goal: Individual exercise prescription set with THRR, safety & activity barriers. Participant demonstrates ability to understand and report RPE using BORG scale, to self-measure pulse accurately, and to acknowledge the importance of the exercise prescription.  Exercise Prescription Goal: Starting with aerobic activity 30 plus  minutes a day, 3 days per week for initial exercise prescription. Provide home exercise prescription and guidelines that participant acknowledges understanding prior to discharge.  Activity Barriers & Risk Stratification:     Activity Barriers & Risk Stratification - 03/12/15 1522    Activity Barriers & Risk Stratification   Risk Stratification --  3 vessel disease >60% all three       6 Minute Walk:     6 Minute Walk      03/12/15 1421       6 Minute Walk   Phase Initial     Distance 1642 feet     Walk Time 6 minutes     Resting HR 68 bpm     Resting BP 124/72 mmHg     Max Ex. HR 98 bpm     Max Ex. BP 140/60 mmHg     RPE 7     Symptoms No        Initial Exercise Prescription:     Initial Exercise Prescription - 03/12/15 1400    Date of Initial Exercise Prescription   Date 03/12/15   Treadmill   MPH 3.1   Grade 0   Minutes 15   Bike   Level 1.5   Minutes 15   Recumbant Bike   Level 3   RPM 50   Minutes 15   NuStep   Level 4   Watts 60   Minutes 15  Arm Ergometer   Level 2   Watts 10   Minutes 15   Arm/Foot Ergometer   Level 2   Watts 15   Minutes 15   Cybex   Level 3   RPM 50   Minutes 15   Recumbant Elliptical   Level 2   Watts 30   Minutes 15   Elliptical   Level 1   Speed 3   Minutes 15   REL-XR   Level 4   Watts 60   Minutes 15   T5 Nustep   Level 3   Watts 30   Minutes 15   Biostep-RELP   Level 4   Watts 60   Minutes 15   Prescription Details   Frequency (times per week) 3   Duration Progress to 30 minutes of continuous aerobic without signs/symptoms of physical distress   Intensity   THRR REST +  30   Ratings of Perceived Exertion 11-15   Progression Continue progressive overload as per policy without signs/symptoms or physical distress.   Resistance Training   Training Prescription Yes   Weight 3   Reps 10-15      Exercise Prescription Changes:     Exercise Prescription Changes      03/12/15 1500 03/20/15  0900 03/29/15 1400 04/03/15 1000 04/17/15 0700   Exercise Review   Progression    Yes    Response to Exercise   Blood Pressure (Admit) 124/72 mmHg  116/64 mmHg  126/70 mmHg   Blood Pressure (Exercise) 140/60 mmHg  148/78 mmHg  142/78 mmHg   Blood Pressure (Exit) 114/66 mmHg  110/62 mmHg  120/74 mmHg   Heart Rate (Admit) 68 bpm  73 bpm  77 bpm   Heart Rate (Exercise) 98 bpm  109 bpm  121 bpm   Heart Rate (Exit) 66 bpm  78 bpm  74 bpm   Rating of Perceived Exertion (Exercise) _0 Symptoms none reported during walk  none none none   Comments  First day of exercise! Patient was oriented to equipment and exercise prescription was discussed with patient. Patient was able to complete exercise in class today with no signs or symptoms.   A new target heart rate range for exercise was calculated based on the patient's ability to exercise with increased intensity. The range is 40-85% of HRR and encompasses moderate-vigorous exercise. The target heart rate range is equivalent to an RPE of 12-17. Met with Jonni Sanger about changing his THR to 40-85% of HRR.He has been instructed to use this THR when he exercises at home.      Duration   Progress to 50 minutes of aerobic without signs/symptoms of physical distress Progress to 50 minutes of aerobic without signs/symptoms of physical distress Progress to 50 minutes of aerobic without signs/symptoms of physical distress   Intensity   THRR unchanged Other (comment)  40-85% HRR 107-148bpm Other (comment)  40-85% HRR 107-148bpm   Progression   Continue progressive overload as per policy without signs/symptoms or physical distress. Continue progressive overload as per policy without signs/symptoms or physical distress. Continue progressive overload as per policy without signs/symptoms or physical distress.   Resistance Training   Training Prescription   Yes Yes Yes   Weight   _1 Reps   10-15 10-15 10-15   Interval Training   Interval Training   No No No    Treadmill   MPH   3.5 3.5 3.5  Grade   _0 Minutes   _1 NuStep   Level   _2 Watts   40 40 40   Minutes   _3 Discharge Exercise Prescription (Final Exercise Prescription Changes):     Exercise Prescription Changes - 04/17/15 0700    Response to Exercise   Blood Pressure (Admit) 126/70 mmHg   Blood Pressure (Exercise) 142/78 mmHg   Blood Pressure (Exit) 120/74 mmHg   Heart Rate (Admit) 77 bpm   Heart Rate (Exercise) 121 bpm   Heart Rate (Exit) 74 bpm   Rating of Perceived Exertion (Exercise) 13   Symptoms none   Duration Progress to 50 minutes of aerobic without signs/symptoms of physical distress   Intensity Other (comment)  40-85% HRR 107-148bpm   Progression Continue progressive overload as per policy without signs/symptoms or physical distress.   Resistance Training   Training Prescription Yes   Weight 10   Reps 10-15   Interval Training   Interval Training No   Treadmill   MPH 3.5   Grade 5   Minutes 20   NuStep   Level 6   Watts 40   Minutes 15      Nutrition:  Target Goals: Understanding of nutrition guidelines, daily intake of sodium <1580m, cholesterol <2060m calories 30% from fat and 7% or less from saturated fats, daily to have 5 or more servings of fruits and vegetables.  Biometrics:     Pre Biometrics - 03/12/15 1424    Pre Biometrics   Height 6' (1.829 m)   Weight 196 lb (88.905 kg)   Waist Circumference 36 inches   Hip Circumference 42 inches   Waist to Hip Ratio 0.86 %   BMI (Calculated) 26.6       Nutrition Therapy Plan and Nutrition Goals:     Nutrition Therapy & Goals - 04/03/15 1142    Nutrition Therapy   Diet 2000kcal DASH diet, 150057modium (2000m30m less)   Fiber 30 grams   Whole Grain Foods 3 servings   Protein 12 ounces/day  ounces of protein foods   Saturated Fats 15 max. grams  maximum grams   Fruits and Vegetables 5 servings/day  or more servings   Personal Nutrition Goals    Personal Goal #1 control food portions at home and especially at restaurants   Personal Goal #2 Keep controlling sugar intake (less than or = 50g daily, or 10% or calories)   Personal Goal #3 Increase vegetables and fruits, plan to have 1 or more with every meal.    Comments patient seems to have good understanding of nutrition concepts, able to state his own goals.      Nutrition Discharge: Rate Your Plate Scores:     Rate Your Plate - 12/262/03/5539741Rate Your Plate Scores   Pre Score 73   Pre Score % 81 %      Nutrition Goals Re-Evaluation:   Psychosocial: Target Goals: Acknowledge presence or absence of depression, maximize coping skills, provide positive support system. Participant is able to verbalize types and ability to use techniques and skills needed for reducing stress and depression.  Initial Review & Psychosocial Screening:     Initial Psych Review & Screening - 03/12/15 [place]   Comments Family lives close to home   Barriers   Psychosocial barriers to  participate in program There are no identifiable barriers or psychosocial needs.;The patient should benefit from training in stress management and relaxation.   Screening Interventions   Interventions Encouraged to exercise      Quality of Life Scores:     Quality of Life - 03/12/15 1818    Quality of Life Scores   Health/Function Pre 24.63 %   Socioeconomic Pre 27.5 %   Psych/Spiritual Pre 27.93 %   Family Pre 30 %   GLOBAL Pre 26.69 %      PHQ-9:     Recent Review Flowsheet Data    Depression screen [facility] 2/9 03/12/2015 02/12/2015   Decreased Interest 0 0   Down, Depressed, Hopeless 0 0   PHQ - 2 Score 0 0   Altered sleeping 0 -   Tired, decreased energy 1 -   Change in appetite 0 -   Feeling bad or failure about yourself  0 -   Trouble concentrating 0 -   Moving slowly or fidgety/restless 0 -   Suicidal thoughts 0 -   PHQ-9 Score 1 -   Difficult  doing work/chores Not difficult at all -      Psychosocial Evaluation and Intervention:     Psychosocial Evaluation - 04/24/15 0943    Psychosocial Evaluation & Interventions   Interventions Encouraged to exercise with the program and follow exercise prescription   Comments Counselor met with Mr. Seever today for initial psychosocial evaluation.  He is a 70 year old well adjusted individual who had a stent inserted on 02/28/15.  He has a strong support system of 38 years and has adult children who live close by as well as active involvement in his local faith community.  Mr. Azzara reports sleeping well, having a good appetite and denies current or past symptoms of depression or anxiety.  He is typically positive and states he has minimal stress now that he has retired last year.  His goals for this program are to educate him on the limits of exercise so that he can get back to running regularly and hopefully participate in a half marathon this April.  He is a member of a gym and is extremely active so he will be able to maintain any progress made in this program with great ease.        Psychosocial Re-Evaluation:   Vocational Rehabilitation: Provide vocational rehab assistance to qualifying candidates.   Vocational Rehab Evaluation & Intervention:     Vocational Rehab - 03/12/15 1323    Initial Vocational Rehab Evaluation & Intervention   Assessment shows need for Vocational Rehabilitation No      Education: Education Goals: Education classes will be provided on a weekly basis, covering required topics. Participant will state understanding/return demonstration of topics presented.  Learning Barriers/Preferences:     Learning Barriers/Preferences - 03/12/15 1322    Learning Barriers/Preferences   Learning Barriers None   Learning Preferences None      Education Topics: General Nutrition Guidelines/Fats and Fiber: -Group instruction provided by verbal, written material,  models and posters to present the general guidelines for heart healthy nutrition. Gives an explanation and review of dietary fats and fiber.   Controlling Sodium/Reading Food Labels: -Group verbal and written material supporting the discussion of sodium use in heart healthy nutrition. Review and explanation with models, verbal and written materials for utilization of the food label.   Exercise Physiology & Risk Factors: - Group verbal and written instruction with models to review the exercise  physiology of the cardiovascular system and associated critical values. Details cardiovascular disease risk factors and the goals associated with each risk factor.   Aerobic Exercise & Resistance Training: - Gives group verbal and written discussion on the health impact of inactivity. On the components of aerobic and resistive training programs and the benefits of this training and how to safely progress through these programs.          Cardiac Rehab from 04/26/2015 in [facility] Cardiac and Pulmonary Rehab   Date  04/17/15   Educator  RM   Instruction Review Code  2- meets goals/outcomes      Flexibility, Balance, General Exercise Guidelines: - Provides group verbal and written instruction on the benefits of flexibility and balance training programs. Provides general exercise guidelines with specific guidelines to those with heart or lung disease. Demonstration and skill practice provided.      Cardiac Rehab from 04/26/2015 in [facility] Cardiac and Pulmonary Rehab   Date  04/17/15   Educator  RM   Instruction Review Code  2- meets goals/outcomes      Stress Management: - Provides group verbal and written instruction about the health risks of elevated stress, cause of high stress, and healthy ways to reduce stress.      Cardiac Rehab from 04/26/2015 in [facility] Cardiac and Pulmonary Rehab   Date  04/12/15   Educator  CE   Instruction Review Code  2- meets goals/outcomes      Depression: - Provides group  verbal and written instruction on the correlation between heart/lung disease and depressed mood, treatment options, and the stigmas associated with seeking treatment.   Anatomy & Physiology of the Heart: - Group verbal and written instruction and models provide basic cardiac anatomy and physiology, with the coronary electrical and arterial systems. Review of: AMI, Angina, Valve disease, Heart Failure, Cardiac Arrhythmia, Pacemakers, and the ICD.      Cardiac Rehab from 04/26/2015 in [facility] Cardiac and Pulmonary Rehab   Date  04/19/15   Educator  CE   Instruction Review Code  2- meets goals/outcomes      Cardiac Procedures: - Group verbal and written instruction and models to describe the testing methods done to diagnose heart disease. Reviews the outcomes of the test results. Describes the treatment choices: Medical Management, Angioplasty, or Coronary Bypass Surgery.      Cardiac Rehab from 04/26/2015 in [facility] Cardiac and Pulmonary Rehab   Date  03/20/15   Educator  DW   Instruction Review Code  2- meets goals/outcomes      Cardiac Medications: - Group verbal and written instruction to review commonly prescribed medications for heart disease. Reviews the medication, class of the drug, and side effects. Includes the steps to properly store meds and maintain the prescription regimen.      Cardiac Rehab from 04/26/2015 in [facility] Cardiac and Pulmonary Rehab   Date  03/29/15   Educator  CE   Instruction Review Code  2- meets goals/outcomes      Go Sex-Intimacy & Heart Disease, Get SMART - Goal Setting: - Group verbal and written instruction through game format to discuss heart disease and the return to sexual intimacy. Provides group verbal and written material to discuss and apply goal setting through the application of the S.M.A.R.T. Method.      Cardiac Rehab from 04/26/2015 in [facility] Cardiac and Pulmonary Rehab   Date  03/20/15   Educator  DW   Instruction Review Code  2- meets goals/outcomes  Other Matters of the Heart: - Provides group verbal, written materials and models to describe Heart Failure, Angina, Valve Disease, and Diabetes in the realm of heart disease. Includes description of the disease process and treatment options available to the cardiac patient.   Exercise & Equipment Safety: - Individual verbal instruction and demonstration of equipment use and safety with use of the equipment.      Cardiac Rehab from 04/26/2015 in [facility] Cardiac and Pulmonary Rehab   Date  03/12/15   Educator  SB   Instruction Review Code  2- meets goals/outcomes      Infection Prevention: - Provides verbal and written material to individual with discussion of infection control including proper hand washing and proper equipment cleaning during exercise session.      Cardiac Rehab from 04/26/2015 in [facility] Cardiac and Pulmonary Rehab   Date  03/12/15   Educator  Sb   Instruction Review Code  2- meets goals/outcomes      Falls Prevention: - Provides verbal and written material to individual with discussion of falls prevention and safety.      Cardiac Rehab from 04/26/2015 in [facility] Cardiac and Pulmonary Rehab   Date  03/12/15   Educator  SB   Instruction Review Code  2- meets goals/outcomes      Diabetes: - Individual verbal and written instruction to review signs/symptoms of diabetes, desired ranges of glucose level fasting, after meals and with exercise. Advice that pre and post exercise glucose checks will be done for 3 sessions at entry of program.    Knowledge Questionnaire Score:     Knowledge Questionnaire Score - 03/12/15 1322    Knowledge Questionnaire Score   Pre Score 22/28      Personal Goals and Risk Factors at Admission:     Personal Goals and Risk Factors at Admission - 03/29/15 1151    Personal Goals and Risk Factors on Admission    Weight Management Yes   Intervention Learn and follow the exercise and diet guidelines while in the program. Utilize the  nutrition and education classes to help gain knowledge of the diet and exercise expectations in the program      Personal Goals and Risk Factors Review:      Goals and Risk Factor Review      03/28/15 0856 03/29/15 1151 03/29/15 1152 04/03/15 1019     Weight Management   Goals Progress/Improvement seen  Yes Yes     Comments   Ladainian wants to lose a couple of lbs so it will be easier to run a half marathon again.      Increase Aerobic Exercise and Physical Activity   Goals Progress/Improvement seen  Yes   Yes    Comments Idriss has been progressing very well and very quickly due to his history of being physically active. He was a runner before his heart procedure and wants to get back to that. He can continuously exercise for the entire class time and in order to continue to challenge him we have increaed his TM speed and NS workload. He went for a walk/jog over Christmas weekend with his wife and averaged an 11.5 min/mile over 2 miles. He is going to continue accumulating exercise minutes at home outside of class.    Jonni Sanger did a 85mle walk/jog at home over the weekend and felt really good. Today in class, he ran for 8 continuous minutes on the treadmill for the first time in a year. He is very excited about his  progress.     Hypertension   Goal   Participant will see blood pressure controlled within the values of 140/63m/Hg or within value directed by their physician.     Progress seen toward goals   Yes     Comments   Blood pressure is good at 120/70 on his blood pressure medicine.      Abnormal Lipids   Progress seen towards goals   Unknown     Comments   Will have follow up blood work with his MD.         Personal Goals Discharge (Final Personal Goals and Risk Factors Review):      Goals and Risk Factor Review - 04/03/15 1019    Increase Aerobic Exercise and Physical Activity   Goals Progress/Improvement seen  Yes   Comments AJonni Sangerdid a 472me walk/jog at home over the weekend and  felt really good. Today in class, he ran for 8 continuous minutes on the treadmill for the first time in a year. He is very excited about his progress.       ITP Comments:     ITP Comments      03/12/15 1511 04/01/15 1335 04/26/15 1314       ITP Comments Initial ITP Ready for 30 day review.  Continue with ITP Ready for 30 day review. Continue with ITP.        Comments:

## 2015-04-26 NOTE — Progress Notes (Signed)
Daily Session Note  Patient Details  Name: ELIZAR ALPERN MRN: [id number] Date of Birth: Feb 19, 1946 Referring Provider:  Yolonda Kida, MD  Encounter Date: 04/26/2015  Check In:     Session Check In - 04/26/15 0850    Check-In   Staff Present Gerlene Burdock, RN, BSN;Kendall Caprice Beaver, BS, Exercise Physiologist;Fermina Mishkin, BS, ACSM EP-C, Exercise Physiologist   ER physicians immediately available to respond to emergencies See telemetry face sheet for immediately available ER MD   Medication changes reported     No   Fall or balance concerns reported    No   Warm-up and Cool-down Performed on first and last piece of equipment   VAD Patient? No   Pain Assessment   Currently in Pain? No/denies         Goals Met:  Proper associated with RPD/PD & O2 Sat Exercise tolerated well No report of cardiac concerns or symptoms Strength training completed today  Goals Unmet:  Not Applicable  Goals Comments:    Dr. Emily Filbert is Medical Director for [place] and [facility].

## 2015-05-01 DIAGNOSIS — Z9861 Coronary angioplasty status: Secondary | ICD-10-CM

## 2015-05-01 NOTE — Progress Notes (Signed)
Daily Session Note  Patient Details  Name: Hector Woodard MRN: [id number] Date of Birth: 02/02/1946 Referring Provider:  Crissman, Mark A, MD  Encounter Date: 05/01/2015  Check In:     Session Check In - 05/01/15 0853    Check-In   Staff Present Renee MacMillan, MS, ACSM CEP, Exercise Physiologist;Kendall McKinney, BS, Exercise Physiologist;Diane Wright, RN, BSN   ER physicians immediately available to respond to emergencies See telemetry face sheet for immediately available ER MD   Medication changes reported     No   Fall or balance concerns reported    No   Warm-up and Cool-down Performed on first and last piece of equipment   VAD Patient? No   Pain Assessment   Currently in Pain? No/denies   Multiple Pain Sites No         Goals Met:  Independence with exercise equipment Exercise tolerated well No report of cardiac concerns or symptoms Strength training completed today  Goals Unmet:  Not Applicable  Goals Comments:    Dr. Mark Miller is Medical Director for [facility] and [facility]. 

## 2015-05-02 ENCOUNTER — Encounter: Payer: Self-pay | Admitting: Family Medicine

## 2015-05-02 NOTE — Addendum Note (Signed)
Addended by: Lynford Humphrey on: 05/02/2015 07:39 AM   Modules accepted: Orders

## 2015-05-03 ENCOUNTER — Encounter: Payer: Commercial Managed Care - HMO | Attending: Internal Medicine | Admitting: *Deleted

## 2015-05-03 DIAGNOSIS — Z9861 Coronary angioplasty status: Secondary | ICD-10-CM | POA: Insufficient documentation

## 2015-05-03 NOTE — Progress Notes (Signed)
Daily Session Note  Patient Details  Name: PRADEEP BEAUBRUN MRN: [id number] Date of Birth: 1945-05-18 Referring Provider:  Yolonda Kida, MD  Encounter Date: 05/03/2015  Check In:     Session Check In - 05/03/15 0935    Check-In   Staff Present Heath Lark, RN, BSN, CCRP;Carroll Enterkin, RN, Apolonio Schneiders, BS, Exercise Physiologist   ER physicians immediately available to respond to emergencies See telemetry face sheet for immediately available ER MD   Medication changes reported     No   Fall or balance concerns reported    No   Warm-up and Cool-down Performed on first and last piece of equipment   VAD Patient? No   Pain Assessment   Currently in Pain? No/denies         Goals Met:  Independence with exercise equipment Exercise tolerated well No report of cardiac concerns or symptoms Strength training completed today  Goals Unmet:  Not Applicable  Goals Comments: Doing well with exercise prescription progression.    Dr. Emily Filbert is Medical Director for [place] and [facility].

## 2015-05-07 ENCOUNTER — Ambulatory Visit (INDEPENDENT_AMBULATORY_CARE_PROVIDER_SITE_OTHER): Payer: Commercial Managed Care - HMO

## 2015-05-07 DIAGNOSIS — Z23 Encounter for immunization: Secondary | ICD-10-CM

## 2015-05-08 ENCOUNTER — Encounter: Payer: Commercial Managed Care - HMO | Admitting: *Deleted

## 2015-05-08 DIAGNOSIS — Z9861 Coronary angioplasty status: Secondary | ICD-10-CM | POA: Diagnosis not present

## 2015-05-08 NOTE — Progress Notes (Signed)
Daily Session Note  Patient Details  Name: Hector Woodard MRN: [id number] Date of Birth: 11-25-1945 Referring Provider:  Yolonda Kida, MD  Encounter Date: 05/08/2015  Check In:     Session Check In - 05/08/15 0913    Check-In   Staff Present Gerlene Burdock, RN, Drusilla Kanner, MS, ACSM CEP, Exercise Physiologist;Diane Joya Gaskins, RN, BSN   Supervising physician immediately available to respond to emergencies See telemetry face sheet for immediately available ER MD   Medication changes reported     No   Fall or balance concerns reported    No   Warm-up and Cool-down Performed on first and last piece of equipment   Resistance Training Performed Yes   VAD Patient? No   Pain Assessment   Currently in Pain? No/denies   Multiple Pain Sites No         Goals Met:  Independence with exercise equipment Exercise tolerated well Personal goals reviewed No report of cardiac concerns or symptoms Strength training completed today  Goals Unmet:  Not Applicable  Goals Comments: Patient completed exercise prescription and all exercise goals during rehab session. The exercise was tolerated well and the patient is progressing in the program.    Dr. Emily Filbert is Medical Director for [place] and [facility].

## 2015-05-09 ENCOUNTER — Encounter: Payer: Self-pay | Admitting: Family Medicine

## 2015-05-09 ENCOUNTER — Ambulatory Visit (INDEPENDENT_AMBULATORY_CARE_PROVIDER_SITE_OTHER): Payer: Commercial Managed Care - HMO | Admitting: Family Medicine

## 2015-05-09 VITALS — BP 126/65 | HR 59 | Temp 98.4°F | Ht 71.0 in | Wt 192.0 lb

## 2015-05-09 DIAGNOSIS — J019 Acute sinusitis, unspecified: Secondary | ICD-10-CM

## 2015-05-09 DIAGNOSIS — I200 Unstable angina: Secondary | ICD-10-CM

## 2015-05-09 MED ORDER — AMOXICILLIN-POT CLAVULANATE 875-125 MG PO TABS: 1.0000 | ORAL_TABLET | Freq: Two times a day (BID) | ORAL | Status: DC

## 2015-05-09 NOTE — Progress Notes (Signed)
BP 126/65 mmHg  Pulse 59  Temp(Src) 98.4 F (36.9 C)  Ht 5\' 11"  (1.803 m)  Wt 192 lb (87.091 kg)  BMI 26.79 kg/m2  SpO2 97%   Subjective:    Patient ID: Hector Woodard, male    DOB: 01/20/1946, 70 y.o.   MRN: [id number]  HPI: Hector Woodard is a 70 y.o. male  Chief Complaint  Patient presents with  . URI   she was over 6+ weeks of head cold drainage congestion feeling bad has had mild vertigo more frequent and just head pressure congestion. Has tried over-the-counter medications with minimal help and symptoms just continued. Patient's been coughing some and some difficulty with sleep is just feeling bad.  Relevant past medical, surgical, family and social history reviewed and updated as indicated. Interim medical history since our last visit reviewed. Allergies and medications reviewed and updated.  Review of Systems  Constitutional: Positive for fever, chills, diaphoresis, activity change, appetite change and fatigue.  HENT: Positive for congestion, postnasal drip, rhinorrhea, sinus pressure, sneezing and sore throat.   Respiratory: Positive for cough, choking and shortness of breath. Negative for wheezing.   Cardiovascular: Negative for chest pain, palpitations and leg swelling.    Per HPI unless specifically indicated above     Objective:    BP 126/65 mmHg  Pulse 59  Temp(Src) 98.4 F (36.9 C)  Ht 5\' 11"  (1.803 m)  Wt 192 lb (87.091 kg)  BMI 26.79 kg/m2  SpO2 97%  Wt Readings from Last 3 Encounters:  05/09/15 192 lb (87.091 kg)  03/12/15 196 lb (88.905 kg)  02/28/15 193 lb (87.544 kg)    Physical Exam  Constitutional: He is oriented to person, place, and time. He appears well-developed and well-nourished. No distress.  HENT:  Head: Normocephalic and atraumatic.  Right Ear: Hearing and external ear normal.  Left Ear: Hearing and external ear normal.  Nose: Nose normal.  Mouth/Throat: Oropharyngeal exudate present.  Eyes: Conjunctivae and lids are normal.  Pupils are equal, round, and reactive to light. Right eye exhibits no discharge. Left eye exhibits no discharge. No scleral icterus.  Neck: Normal range of motion. Neck supple.  Cardiovascular: Normal rate, regular rhythm and normal heart sounds.   Pulmonary/Chest: Effort normal and breath sounds normal. No respiratory distress. He has no wheezes. He has no rales.  Musculoskeletal: Normal range of motion.  Lymphadenopathy:    He has no cervical adenopathy.  Neurological: He is alert and oriented to person, place, and time.  Skin: Skin is intact. No rash noted.  Psychiatric: He has a normal mood and affect. His speech is normal and behavior is normal. Judgment and thought content normal. Cognition and memory are normal.    Results for orders placed or performed in visit on 04/24/15  Comprehensive metabolic panel  Result Value Ref Range   Glucose 97 65 - 99 mg/dL   BUN 18 8 - 27 mg/dL   Creatinine, Ser 1.31 (H) 0.76 - 1.27 mg/dL   GFR calc non Af Amer 56 (L) >59 mL/min/1.73   GFR calc Af Amer 65 >59 mL/min/1.73   BUN/Creatinine Ratio 14 10 - 22   Sodium 142 134 - 144 mmol/L   Potassium 4.5 3.5 - 5.2 mmol/L   Chloride 104 96 - 106 mmol/L   CO2 24 18 - 29 mmol/L   Calcium 9.7 8.6 - 10.2 mg/dL   Total Protein 6.7 6.0 - 8.5 g/dL   Albumin 4.3 3.6 - 4.8 g/dL  Globulin, Total 2.4 1.5 - 4.5 g/dL   Albumin/Globulin Ratio 1.8 1.1 - 2.5   Bilirubin Total 0.6 0.0 - 1.2 mg/dL   Alkaline Phosphatase 45 39 - 117 IU/L   AST 23 0 - 40 IU/L   ALT 20 0 - 44 IU/L  NMR, lipoprofile  Result Value Ref Range   LDL Particle Number CANCELED nmol/L   LDL-C CANCELED mg/dL   HDL Cholesterol by NMR CANCELED    Triglycerides by NMR CANCELED    Cholesterol CANCELED    HDL Particle Number CANCELED   NMR, lipoprofile  Result Value Ref Range   LDL Particle Number 923 <1000 nmol/L   LDL-C 73 0 - 99 mg/dL   HDL Cholesterol by NMR 63 >39 mg/dL   Triglycerides by NMR 74 0 - 149 mg/dL   Cholesterol 151 100 -  199 mg/dL   HDL Particle Number 36.5 >=30.5 umol/L   Small LDL Particle Number 332 <=527 nmol/L   LDL Size 20.8 >20.5 nm   LP-IR Score <25 <=45      Assessment & Plan:   Problem List Items Addressed This Visit      Cardiovascular and Mediastinum   Unstable angina (HCC)    stable      Relevant Medications   atorvastatin (LIPITOR) 40 MG tablet     Respiratory   Acute sinusitis - Primary    Discussed sinusitis care and treatment with OTCs nasal Rentz Tylenol medications Discuss Augmentin side effects      Relevant Medications   amoxicillin-clavulanate (AUGMENTIN) 875-125 MG tablet       Follow up plan: Return for As scheduled.

## 2015-05-09 NOTE — Assessment & Plan Note (Signed)
Discussed sinusitis care and treatment with OTCs nasal Rentz Tylenol medications Discuss Augmentin side effects

## 2015-05-09 NOTE — Assessment & Plan Note (Signed)
stable °

## 2015-05-10 ENCOUNTER — Encounter: Payer: Commercial Managed Care - HMO | Admitting: *Deleted

## 2015-05-10 DIAGNOSIS — Z9861 Coronary angioplasty status: Secondary | ICD-10-CM

## 2015-05-10 NOTE — Progress Notes (Signed)
Daily Session Note  Patient Details  Name: Hector Woodard MRN: [id number] Date of Birth: 09-13-1945 Referring Provider:  Yolonda Kida, MD  Encounter Date: 05/10/2015  Check In:     Session Check In - 05/10/15 4481    Check-In   Staff Present Heath Lark, RN, BSN, CCRP;Carroll Enterkin, RN, Jana Half, RN, BSN   Supervising physician immediately available to respond to emergencies See telemetry face sheet for immediately available ER MD   Medication changes reported     No   Fall or balance concerns reported    No   Warm-up and Cool-down Performed on first and last piece of equipment   Resistance Training Performed No   VAD Patient? No   Pain Assessment   Currently in Pain? No/denies         Goals Met:  Independence with exercise equipment Exercise tolerated well No report of cardiac concerns or symptoms  Goals Unmet:  Not applicable  Goals Comments: Doing well with exercise prescription progression.    Dr. Emily Filbert is Medical Director for [place] and [facility].

## 2015-05-10 NOTE — Progress Notes (Signed)
Cardiac Individual Treatment Plan  Patient Details  Name: Hector Woodard MRN: [id number] Date of Birth: 29-Feb-1946 Referring Provider:  Yolonda Kida, MD  Initial Encounter Date:    Visit Diagnosis: S/P PTCA (percutaneous transluminal coronary angioplasty)  Patient's Home Medications on Admission:  Current outpatient prescriptions:  .  amoxicillin-clavulanate (AUGMENTIN) 875-125 MG tablet, Take 1 tablet by mouth 2 (two) times daily., Disp: 20 tablet, Rfl: 0 .  aspirin EC 81 MG tablet, Take 81 mg by mouth daily., Disp: , Rfl:  .  atorvastatin (LIPITOR) 40 MG tablet, 40 mg at bedtime., Disp: , Rfl:  .  clopidogrel (PLAVIX) 75 MG tablet, Take 1 tablet (75 mg total) by mouth daily., Disp: 30 tablet, Rfl: 11 .  metoprolol succinate (TOPROL-XL) 25 MG 24 hr tablet, Take 1 tablet (25 mg total) by mouth daily. (Patient taking differently: Take 12.5 mg by mouth daily. ), Disp: 30 tablet, Rfl: 11 .  Multiple Vitamin (MULTIVITAMIN) capsule, Take 1 capsule by mouth daily., Disp: , Rfl:  .  Omega-3 Fatty Acids (FISH OIL) 1000 MG CAPS, Take 1 capsule by mouth at bedtime., Disp: , Rfl:   Past Medical History: Past Medical History  Diagnosis Date  . Hypercholesteremia   . Vertigo 2015  . PIN (prostatic intraepithelial neoplasia)   . Anginal pain ([place])   . Hypertension     Tobacco Use: History  Smoking status  . Never Smoker   Smokeless tobacco  . Never Used    Labs: Recent Review Flowsheet Data    Labs for ITP Cardiac and Pulmonary Rehab Latest Ref Rng 02/12/2015 04/24/2015 04/24/2015   Cholestrol - 180 151 CANCELED   LDLCALC 0 - 99 mg/dL 92 - -   HDL - 70 63 CANCELED   Trlycerides - 91 74 CANCELED       Exercise Target Goals:    Exercise Program Goal: Individual exercise prescription set with THRR, safety & activity barriers. Participant demonstrates ability to understand and report RPE using BORG scale, to self-measure pulse accurately, and to acknowledge the importance of  the exercise prescription.  Exercise Prescription Goal: Starting with aerobic activity 30 plus minutes a day, 3 days per week for initial exercise prescription. Provide home exercise prescription and guidelines that participant acknowledges understanding prior to discharge.  Activity Barriers & Risk Stratification:     Activity Barriers & Cardiac Risk Stratification - 03/12/15 1522    Activity Barriers & Cardiac Risk Stratification   Cardiac Risk Stratification --  3 vessel disease >60% all three       6 Minute Walk:     6 Minute Walk      03/12/15 1421       6 Minute Walk   Phase Initial     Distance 1642 feet     Walk Time 6 minutes     RPE 7     Symptoms No     Resting HR 68 bpm     Resting BP 124/72 mmHg     Max Ex. HR 98 bpm     Max Ex. BP 140/60 mmHg        Initial Exercise Prescription:     Initial Exercise Prescription - 03/12/15 1400    Date of Initial Exercise Prescription   Date 03/12/15   Treadmill   MPH 3.1   Grade 0   Minutes 15   Bike   Level 1.5   Minutes 15   Recumbant Bike   Level 3   RPM 50  Minutes 15   NuStep   Level 4   Watts 60   Minutes 15   Arm Ergometer   Level 2   Watts 10   Minutes 15   Arm/Foot Ergometer   Level 2   Watts 15   Minutes 15   Cybex   Level 3   RPM 50   Minutes 15   Recumbant Elliptical   Level 2   Watts 30   Minutes 15   Elliptical   Level 1   Speed 3   Minutes 15   REL-XR   Level 4   Watts 60   Minutes 15   T5 Nustep   Level 3   Watts 30   Minutes 15   Biostep-RELP   Level 4   Watts 60   Minutes 15   Prescription Details   Frequency (times per week) 3   Duration Progress to 30 minutes of continuous aerobic without signs/symptoms of physical distress   Intensity   THRR REST +  30   Ratings of Perceived Exertion 11-15   Progression Continue progressive overload as per policy without signs/symptoms or physical distress.   Resistance Training   Training Prescription Yes   Weight 3    Reps 10-15      Exercise Prescription Changes:     Exercise Prescription Changes      03/12/15 1500 03/20/15 0900 03/29/15 1400 04/03/15 1000 04/17/15 0700   Exercise Review   Progression    Yes    Response to Exercise   Blood Pressure (Admit) 124/72 mmHg  116/64 mmHg  126/70 mmHg   Blood Pressure (Exercise) 140/60 mmHg  148/78 mmHg  142/78 mmHg   Blood Pressure (Exit) 114/66 mmHg  110/62 mmHg  120/74 mmHg   Heart Rate (Admit) 68 bpm  73 bpm  77 bpm   Heart Rate (Exercise) 98 bpm  109 bpm  121 bpm   Heart Rate (Exit) 66 bpm  78 bpm  74 bpm   Rating of Perceived Exertion (Exercise) '7  12  13   '$ Symptoms none reported during walk  none none none   Comments  First day of exercise! Patient was oriented to equipment and exercise prescription was discussed with patient. Patient was able to complete exercise in class today with no signs or symptoms.   A new target heart rate range for exercise was calculated based on the patient's ability to exercise with increased intensity. The range is 40-85% of HRR and encompasses moderate-vigorous exercise. The target heart rate range is equivalent to an RPE of 12-17. Met with Jonni Sanger about changing his THR to 40-85% of HRR.He has been instructed to use this THR when he exercises at home.      Duration   Progress to 50 minutes of aerobic without signs/symptoms of physical distress Progress to 50 minutes of aerobic without signs/symptoms of physical distress Progress to 50 minutes of aerobic without signs/symptoms of physical distress   Intensity   THRR unchanged Other (comment)  40-85% HRR 107-148bpm Other (comment)  40-85% HRR 107-148bpm   Progression   Continue progressive overload as per policy without signs/symptoms or physical distress. Continue progressive overload as per policy without signs/symptoms or physical distress. Continue progressive overload as per policy without signs/symptoms or physical distress.   Resistance Training   Training  Prescription   Yes Yes Yes   Weight   '10 10 10   '$ Reps   10-15 10-15 10-15   Interval Training   Interval  Training   No No No   Treadmill   MPH   3.5 3.5 3.5   Grade   '5 5 5   '$ Minutes   '20 20 20   '$ NuStep   Level   '6 6 6   '$ Watts   40 40 40   Minutes   '15 15 15      '$ Discharge Exercise Prescription (Final Exercise Prescription Changes):     Exercise Prescription Changes - 04/17/15 0700    Response to Exercise   Blood Pressure (Admit) 126/70 mmHg   Blood Pressure (Exercise) 142/78 mmHg   Blood Pressure (Exit) 120/74 mmHg   Heart Rate (Admit) 77 bpm   Heart Rate (Exercise) 121 bpm   Heart Rate (Exit) 74 bpm   Rating of Perceived Exertion (Exercise) 13   Symptoms none   Duration Progress to 50 minutes of aerobic without signs/symptoms of physical distress   Intensity Other (comment)  40-85% HRR 107-148bpm   Progression Continue progressive overload as per policy without signs/symptoms or physical distress.   Resistance Training   Training Prescription Yes   Weight 10   Reps 10-15   Interval Training   Interval Training No   Treadmill   MPH 3.5   Grade 5   Minutes 20   NuStep   Level 6   Watts 40   Minutes 15      Nutrition:  Target Goals: Understanding of nutrition guidelines, daily intake of sodium '1500mg'$ , cholesterol '200mg'$ , calories 30% from fat and 7% or less from saturated fats, daily to have 5 or more servings of fruits and vegetables.  Biometrics:     Pre Biometrics - 03/12/15 1424    Pre Biometrics   Height 6' (1.829 m)   Weight 196 lb (88.905 kg)   Waist Circumference 36 inches   Hip Circumference 42 inches   Waist to Hip Ratio 0.86 %   BMI (Calculated) 26.6       Nutrition Therapy Plan and Nutrition Goals:     Nutrition Therapy & Goals - 04/03/15 1142    Nutrition Therapy   Diet 2000kcal DASH diet, '1500mg'$  sodium ('2000mg'$  or less)   Fiber 30 grams   Whole Grain Foods 3 servings   Protein 12 ounces/day  ounces of protein foods   Saturated  Fats 15 max. grams  maximum grams   Fruits and Vegetables 5 servings/day  or more servings   Personal Nutrition Goals   Personal Goal #1 control food portions at home and especially at restaurants   Personal Goal #2 Keep controlling sugar intake (less than or = 50g daily, or 10% or calories)   Personal Goal #3 Increase vegetables and fruits, plan to have 1 or more with every meal.    Comments patient seems to have good understanding of nutrition concepts, able to state his own goals.      Nutrition Discharge: Rate Your Plate Scores:     Rate Your Plate - 56/43/32 9518    Rate Your Plate Scores   Pre Score 73   Pre Score % 81 %      Nutrition Goals Re-Evaluation:     Nutrition Goals Re-Evaluation      05/10/15 1153           Personal Goal #1 Re-Evaluation   Goal Progress Seen Yes       Personal Goal #2 Re-Evaluation   Personal Goal #2 Mitzi Hansen met with the dietician and he said he and his wife have made  some changes including atting blueberries to his oatmeal with almonds in the am. He said they have been reading labels better to decrease thieir sugar intake and rarely eat pizza now.        Goal Progress Seen Yes       Personal Goal #3 Re-Evaluation   Personal Goal #3 Jaquil met with the dietician and he said he and his wife have made some changes including atting blueberries to his oatmeal with almonds in the am. He said they have been reading labels better to decrease thieir sugar intake and rarely eat pizza now.        Goal Progress Seen Yes          Psychosocial: Target Goals: Acknowledge presence or absence of depression, maximize coping skills, provide positive support system. Participant is able to verbalize types and ability to use techniques and skills needed for reducing stress and depression.  Initial Review & Psychosocial Screening:     Initial Psych Review & Screening - 03/12/15 [place]? Yes   Comments Family lives  close to home   Barriers   Psychosocial barriers to participate in program There are no identifiable barriers or psychosocial needs.;The patient should benefit from training in stress management and relaxation.   Screening Interventions   Interventions Encouraged to exercise      Quality of Life Scores:     Quality of Life - 03/12/15 1818    Quality of Life Scores   Health/Function Pre 24.63 %   Socioeconomic Pre 27.5 %   Psych/Spiritual Pre 27.93 %   Family Pre 30 %   GLOBAL Pre 26.69 %      PHQ-9:     Recent Review Flowsheet Data    Depression screen [facility] 2/9 03/12/2015 02/12/2015   Decreased Interest 0 0   Down, Depressed, Hopeless 0 0   PHQ - 2 Score 0 0   Altered sleeping 0 -   Tired, decreased energy 1 -   Change in appetite 0 -   Feeling bad or failure about yourself  0 -   Trouble concentrating 0 -   Moving slowly or fidgety/restless 0 -   Suicidal thoughts 0 -   PHQ-9 Score 1 -   Difficult doing work/chores Not difficult at all -      Psychosocial Evaluation and Intervention:     Psychosocial Evaluation - 04/24/15 0943    Psychosocial Evaluation & Interventions   Interventions Encouraged to exercise with the program and follow exercise prescription   Comments Counselor met with Mr. Mcclenathan today for initial psychosocial evaluation.  He is a 70 year old well adjusted individual who had a stent inserted on 02/28/15.  He has a strong support system of 38 years and has adult children who live close by as well as active involvement in his local faith community.  Mr. Rainford reports sleeping well, having a good appetite and denies current or past symptoms of depression or anxiety.  He is typically positive and states he has minimal stress now that he has retired last year.  His goals for this program are to educate him on the limits of exercise so that he can get back to running regularly and hopefully participate in a half marathon this April.  He is a member of a gym  and is extremely active so he will be able to maintain any progress made in this program with great ease.  Psychosocial Re-Evaluation:     Psychosocial Re-Evaluation      05/10/15 1158           Psychosocial Re-Evaluation   Interventions Encouraged to attend Cardiac Rehabilitation for the exercise       Comments Burlie said he feels that Cardiac Rehab has helped him. He has taken the [facility] Volunteer class and will be volunteering on the Cardiac Floor upstairs. He said he hopes to encourage people to attend Cardiac Rehab since it has helped him.          Vocational Rehabilitation: Provide vocational rehab assistance to qualifying candidates.   Vocational Rehab Evaluation & Intervention:     Vocational Rehab - 03/12/15 1323    Initial Vocational Rehab Evaluation & Intervention   Assessment shows need for Vocational Rehabilitation No      Education: Education Goals: Education classes will be provided on a weekly basis, covering required topics. Participant will state understanding/return demonstration of topics presented.  Learning Barriers/Preferences:     Learning Barriers/Preferences - 03/12/15 1322    Learning Barriers/Preferences   Learning Barriers None   Learning Preferences None      Education Topics: General Nutrition Guidelines/Fats and Fiber: -Group instruction provided by verbal, written material, models and posters to present the general guidelines for heart healthy nutrition. Gives an explanation and review of dietary fats and fiber.          Cardiac Rehab from 05/08/2015 in [facility] Cardiac and Pulmonary Rehab   Date  05/08/15   Educator  CR   Instruction Review Code  2- meets goals/outcomes      Controlling Sodium/Reading Food Labels: -Group verbal and written material supporting the discussion of sodium use in heart healthy nutrition. Review and explanation with models, verbal and written materials for utilization of the food label.   Exercise  Physiology & Risk Factors: - Group verbal and written instruction with models to review the exercise physiology of the cardiovascular system and associated critical values. Details cardiovascular disease risk factors and the goals associated with each risk factor.   Aerobic Exercise & Resistance Training: - Gives group verbal and written discussion on the health impact of inactivity. On the components of aerobic and resistive training programs and the benefits of this training and how to safely progress through these programs.      Cardiac Rehab from 05/08/2015 in [facility] Cardiac and Pulmonary Rehab   Date  04/17/15   Educator  RM   Instruction Review Code  2- meets goals/outcomes      Flexibility, Balance, General Exercise Guidelines: - Provides group verbal and written instruction on the benefits of flexibility and balance training programs. Provides general exercise guidelines with specific guidelines to those with heart or lung disease. Demonstration and skill practice provided.      Cardiac Rehab from 05/08/2015 in [facility] Cardiac and Pulmonary Rehab   Date  04/17/15   Educator  RM   Instruction Review Code  2- meets goals/outcomes      Stress Management: - Provides group verbal and written instruction about the health risks of elevated stress, cause of high stress, and healthy ways to reduce stress.      Cardiac Rehab from 05/08/2015 in [facility] Cardiac and Pulmonary Rehab   Date  04/12/15   Educator  CE   Instruction Review Code  2- meets goals/outcomes      Depression: - Provides group verbal and written instruction on the correlation between heart/lung disease and depressed mood, treatment options, and  the stigmas associated with seeking treatment.   Anatomy & Physiology of the Heart: - Group verbal and written instruction and models provide basic cardiac anatomy and physiology, with the coronary electrical and arterial systems. Review of: AMI, Angina, Valve disease, Heart Failure,  Cardiac Arrhythmia, Pacemakers, and the ICD.      Cardiac Rehab from 05/08/2015 in [facility] Cardiac and Pulmonary Rehab   Date  04/19/15   Educator  CE   Instruction Review Code  2- meets goals/outcomes      Cardiac Procedures: - Group verbal and written instruction and models to describe the testing methods done to diagnose heart disease. Reviews the outcomes of the test results. Describes the treatment choices: Medical Management, Angioplasty, or Coronary Bypass Surgery.      Cardiac Rehab from 05/08/2015 in [facility] Cardiac and Pulmonary Rehab   Date  03/20/15   Educator  DW   Instruction Review Code  2- meets goals/outcomes      Cardiac Medications: - Group verbal and written instruction to review commonly prescribed medications for heart disease. Reviews the medication, class of the drug, and side effects. Includes the steps to properly store meds and maintain the prescription regimen.      Cardiac Rehab from 05/08/2015 in [facility] Cardiac and Pulmonary Rehab   Date  03/29/15   Educator  CE   Instruction Review Code  2- meets goals/outcomes      Go Sex-Intimacy & Heart Disease, Get SMART - Goal Setting: - Group verbal and written instruction through game format to discuss heart disease and the return to sexual intimacy. Provides group verbal and written material to discuss and apply goal setting through the application of the S.M.A.R.T. Method.      Cardiac Rehab from 05/08/2015 in [facility] Cardiac and Pulmonary Rehab   Date  03/20/15   Educator  DW   Instruction Review Code  2- meets goals/outcomes      Other Matters of the Heart: - Provides group verbal, written materials and models to describe Heart Failure, Angina, Valve Disease, and Diabetes in the realm of heart disease. Includes description of the disease process and treatment options available to the cardiac patient.      Cardiac Rehab from 05/08/2015 in [facility] Cardiac and Pulmonary Rehab   Date  05/01/15   Educator  DW   Instruction Review  Code  2- meets goals/outcomes      Exercise & Equipment Safety: - Individual verbal instruction and demonstration of equipment use and safety with use of the equipment.      Cardiac Rehab from 05/08/2015 in [facility] Cardiac and Pulmonary Rehab   Date  03/12/15   Educator  SB   Instruction Review Code  2- meets goals/outcomes      Infection Prevention: - Provides verbal and written material to individual with discussion of infection control including proper hand washing and proper equipment cleaning during exercise session.      Cardiac Rehab from 05/08/2015 in [facility] Cardiac and Pulmonary Rehab   Date  03/12/15   Educator  Sb   Instruction Review Code  2- meets goals/outcomes      Falls Prevention: - Provides verbal and written material to individual with discussion of falls prevention and safety.      Cardiac Rehab from 05/08/2015 in [facility] Cardiac and Pulmonary Rehab   Date  03/12/15   Educator  SB   Instruction Review Code  2- meets goals/outcomes      Diabetes: - Individual verbal and written instruction to  review signs/symptoms of diabetes, desired ranges of glucose level fasting, after meals and with exercise. Advice that pre and post exercise glucose checks will be done for 3 sessions at entry of program.    Knowledge Questionnaire Score:     Knowledge Questionnaire Score - 03/12/15 1322    Knowledge Questionnaire Score   Pre Score 22/28      Personal Goals and Risk Factors at Admission:     Personal Goals and Risk Factors at Admission - 03/29/15 1151    Personal Goals and Risk Factors on Admission    Weight Management Yes   Intervention Learn and follow the exercise and diet guidelines while in the program. Utilize the nutrition and education classes to help gain knowledge of the diet and exercise expectations in the program      Personal Goals and Risk Factors Review:      Goals and Risk Factor Review      03/28/15 0856 03/29/15 1151 03/29/15 1152 04/03/15 1019  05/10/15 1154   Weight Management   Goals Progress/Improvement seen  Yes Yes  Yes   Comments   Barney wants to lose a couple of lbs so it will be easier to run a half marathon again.   Jeramey has lost weight but he wants to lose 5-6 lbs more. He is almost at goal weight but since he runs half marathons or his goal is to run another half marathon he wants to lose weight.    Increase Aerobic Exercise and Physical Activity   Goals Progress/Improvement seen  Yes   Yes Yes   Comments Constant has been progressing very well and very quickly due to his history of being physically active. He was a runner before his heart procedure and wants to get back to that. He can continuously exercise for the entire class time and in order to continue to challenge him we have increaed his TM speed and NS workload. He went for a walk/jog over Christmas weekend with his wife and averaged an 11.5 min/mile over 2 miles. He is going to continue accumulating exercise minutes at home outside of class.    Jonni Sanger did a 56mle walk/jog at home over the weekend and felt really good. Today in class, he ran for 8 continuous minutes on the treadmill for the first time in a year. He is very excited about his progress.  AAzzantried the upright bike on level 8 since he wanted to challenge himself and max his heart rate. On level 8 his heart rate was 110. When he went to level 10 his heart rate went higher to 126 and his blood pressure really increased 208/72 . No c/o and he said he really felt a workout with the upright bike.   Hypertension   Goal   Participant will see blood pressure controlled within the values of 140/910mHg or within value directed by their physician.     Progress seen toward goals   Yes  Yes   Comments   Blood pressure is good at 120/70 on his blood pressure medicine.   blood pressure 128-142/72 upon arrival to Cardiac Rehab.    Abnormal Lipids   Progress seen towards goals   Unknown  Unknown   Comments   Will have follow  up blood work with his MD.   AnAzadeports his MD follows up on his lipid panel blood work.       Personal Goals Discharge (Final Personal Goals and Risk Factors Review):  Goals and Risk Factor Review - 05/10/15 1154    Weight Management   Goals Progress/Improvement seen Yes   Comments Kayla has lost weight but he wants to lose 5-6 lbs more. He is almost at goal weight but since he runs half marathons or his goal is to run another half marathon he wants to lose weight.    Increase Aerobic Exercise and Physical Activity   Goals Progress/Improvement seen  Yes   Comments Tung tried the upright bike on level 8 since he wanted to challenge himself and max his heart rate. On level 8 his heart rate was 110. When he went to level 10 his heart rate went higher to 126 and his blood pressure really increased 208/72 . No c/o and he said he really felt a workout with the upright bike.   Hypertension   Progress seen toward goals Yes   Comments blood pressure 128-142/72 upon arrival to Cardiac Rehab.    Abnormal Lipids   Progress seen towards goals Unknown   Comments Ovidio reports his MD follows up on his lipid panel blood work.       ITP Comments:     ITP Comments      03/12/15 1511 04/01/15 1335 04/26/15 1314 05/10/15 1151     ITP Comments Initial ITP Ready for 30 day review.  Continue with ITP Ready for 30 day review. Continue with ITP. Jonni Sanger said his MD started him on Augmentin for a sinus infection since he has had a runny nose/cold congestion for a while. Eitan met with the dietician and he said he and his wife have made some changes including atting blueberries to his oatmeal with almonds in the am. He said they have been reading labels better to decrease thieir sugar intake and rarely eat pizza now.        Comments:

## 2015-05-22 ENCOUNTER — Encounter: Payer: Commercial Managed Care - HMO | Admitting: *Deleted

## 2015-05-22 DIAGNOSIS — Z9861 Coronary angioplasty status: Secondary | ICD-10-CM

## 2015-05-22 NOTE — Progress Notes (Signed)
Daily Session Note  Patient Details  Name: Hector Woodard MRN: [id number] Date of Birth: [birth year] Referring Provider:  Yolonda Kida, MD  Encounter Date: 05/22/2015  Check In:     Session Check In - 05/22/15 0943    Check-In   Location [facility]   Staff Present Nyoka Cowden, RN;Renee Dillard [place], MS, ACSM CEP, Exercise Physiologist;Diane Joya Gaskins, RN, BSN   Supervising physician immediately available to respond to emergencies See telemetry face sheet for immediately available ER MD   Medication changes reported     No   Fall or balance concerns reported    No   Warm-up and Cool-down Performed on first and last piece of equipment   Resistance Training Performed Yes   VAD Patient? No   Pain Assessment   Currently in Pain? No/denies   Multiple Pain Sites No         Goals Met:  Independence with exercise equipment Exercise tolerated well No report of cardiac concerns or symptoms Strength training completed today  Goals Unmet:  Not Applicable  Goals Comments:  Patient completed exercise prescription and all exercise goals during rehab session. The exercise was tolerated well and the patient is progressing in the program.    Dr. Emily Filbert is Medical Director for [facility].

## 2015-05-22 NOTE — Progress Notes (Signed)
Cardiac Individual Treatment Plan  Patient Details  Name: SEM MCCAUGHEY MRN: [id number] Date of Birth: 1945-05-21 Referring Provider:  Yolonda Kida, MD  Initial Encounter Date:    Visit Diagnosis: S/P PTCA (percutaneous transluminal coronary angioplasty)  Patient's Home Medications on Admission:  Current outpatient prescriptions:  .  amoxicillin-clavulanate (AUGMENTIN) 875-125 MG tablet, Take 1 tablet by mouth 2 (two) times daily., Disp: 20 tablet, Rfl: 0 .  aspirin EC 81 MG tablet, Take 81 mg by mouth daily., Disp: , Rfl:  .  atorvastatin (LIPITOR) 40 MG tablet, 40 mg at bedtime., Disp: , Rfl:  .  clopidogrel (PLAVIX) 75 MG tablet, Take 1 tablet (75 mg total) by mouth daily., Disp: 30 tablet, Rfl: 11 .  metoprolol succinate (TOPROL-XL) 25 MG 24 hr tablet, Take 1 tablet (25 mg total) by mouth daily. (Patient taking differently: Take 12.5 mg by mouth daily. ), Disp: 30 tablet, Rfl: 11 .  Multiple Vitamin (MULTIVITAMIN) capsule, Take 1 capsule by mouth daily., Disp: , Rfl:  .  Omega-3 Fatty Acids (FISH OIL) 1000 MG CAPS, Take 1 capsule by mouth at bedtime., Disp: , Rfl:   Past Medical History: Past Medical History  Diagnosis Date  . Hypercholesteremia   . Vertigo 2015  . PIN (prostatic intraepithelial neoplasia)   . Anginal pain ([place])   . Hypertension     Tobacco Use: History  Smoking status  . Never Smoker   Smokeless tobacco  . Never Used    Labs: Recent Review Flowsheet Data    Labs for ITP Cardiac and Pulmonary Rehab Latest Ref Rng 02/12/2015 04/24/2015 04/24/2015   Cholestrol - 180 151 CANCELED   LDLCALC 0 - 99 mg/dL 92 - -   HDL - 70 63 CANCELED   Trlycerides - 91 74 CANCELED       Exercise Target Goals:    Exercise Program Goal: Individual exercise prescription set with THRR, safety & activity barriers. Participant demonstrates ability to understand and report RPE using BORG scale, to self-measure pulse accurately, and to acknowledge the importance of  the exercise prescription.  Exercise Prescription Goal: Starting with aerobic activity 30 plus minutes a day, 3 days per week for initial exercise prescription. Provide home exercise prescription and guidelines that participant acknowledges understanding prior to discharge.  Activity Barriers & Risk Stratification:     Activity Barriers & Cardiac Risk Stratification - 03/12/15 1522    Activity Barriers & Cardiac Risk Stratification   Cardiac Risk Stratification --  3 vessel disease >60% all three       6 Minute Walk:     6 Minute Walk      03/12/15 1421       6 Minute Walk   Phase Initial     Distance 1642 feet     Walk Time 6 minutes     RPE 7     Symptoms No     Resting HR 68 bpm     Resting BP 124/72 mmHg     Max Ex. HR 98 bpm     Max Ex. BP 140/60 mmHg        Initial Exercise Prescription:     Initial Exercise Prescription - 03/12/15 1400    Date of Initial Exercise Prescription   Date 03/12/15   Treadmill   MPH 3.1   Grade 0   Minutes 15   Bike   Level 1.5   Minutes 15   Recumbant Bike   Level 3   RPM 50  Minutes 15   NuStep   Level 4   Watts 60   Minutes 15   Arm Ergometer   Level 2   Watts 10   Minutes 15   Arm/Foot Ergometer   Level 2   Watts 15   Minutes 15   Cybex   Level 3   RPM 50   Minutes 15   Recumbant Elliptical   Level 2   Watts 30   Minutes 15   Elliptical   Level 1   Speed 3   Minutes 15   REL-XR   Level 4   Watts 60   Minutes 15   T5 Nustep   Level 3   Watts 30   Minutes 15   Biostep-RELP   Level 4   Watts 60   Minutes 15   Prescription Details   Frequency (times per week) 3   Duration Progress to 30 minutes of continuous aerobic without signs/symptoms of physical distress   Intensity   THRR REST +  30   Ratings of Perceived Exertion 11-15   Progression Continue progressive overload as per policy without signs/symptoms or physical distress.   Resistance Training   Training Prescription Yes   Weight 3    Reps 10-15      Exercise Prescription Changes:     Exercise Prescription Changes      03/12/15 1500 03/20/15 0900 03/29/15 1400 04/03/15 1000 04/17/15 0700   Exercise Review   Progression    Yes    Response to Exercise   Blood Pressure (Admit) 124/72 mmHg  116/64 mmHg  126/70 mmHg   Blood Pressure (Exercise) 140/60 mmHg  148/78 mmHg  142/78 mmHg   Blood Pressure (Exit) 114/66 mmHg  110/62 mmHg  120/74 mmHg   Heart Rate (Admit) 68 bpm  73 bpm  77 bpm   Heart Rate (Exercise) 98 bpm  109 bpm  121 bpm   Heart Rate (Exit) 66 bpm  78 bpm  74 bpm   Rating of Perceived Exertion (Exercise) '7  12  13   '$ Symptoms none reported during walk  none none none   Comments  First day of exercise! Patient was oriented to equipment and exercise prescription was discussed with patient. Patient was able to complete exercise in class today with no signs or symptoms.   A new target heart rate range for exercise was calculated based on the patient's ability to exercise with increased intensity. The range is 40-85% of HRR and encompasses moderate-vigorous exercise. The target heart rate range is equivalent to an RPE of 12-17. Met with Jonni Sanger about changing his THR to 40-85% of HRR.He has been instructed to use this THR when he exercises at home.      Duration   Progress to 50 minutes of aerobic without signs/symptoms of physical distress Progress to 50 minutes of aerobic without signs/symptoms of physical distress Progress to 50 minutes of aerobic without signs/symptoms of physical distress   Intensity   THRR unchanged Other (comment)  40-85% HRR 107-148bpm Other (comment)  40-85% HRR 107-148bpm   Progression   Continue progressive overload as per policy without signs/symptoms or physical distress. Continue progressive overload as per policy without signs/symptoms or physical distress. Continue progressive overload as per policy without signs/symptoms or physical distress.   Resistance Training   Training  Prescription   Yes Yes Yes   Weight   '10 10 10   '$ Reps   10-15 10-15 10-15   Interval Training   Interval  Training   No No No   Treadmill   MPH   3.5 3.5 3.5   Grade   '5 5 5   '$ Minutes   '20 20 20   '$ NuStep   Level   '6 6 6   '$ Watts   40 40 40   Minutes   '15 15 15     '$ 05/10/15 0733           Exercise Review   Progression Yes       Response to Exercise   Blood Pressure (Admit) 142/72 mmHg       Blood Pressure (Exercise) 208/72 mmHg       Blood Pressure (Exit) 120/80 mmHg       Heart Rate (Admit) 84 bpm       Heart Rate (Exercise) 140 bpm       Heart Rate (Exit) 83 bpm       Rating of Perceived Exertion (Exercise) 14       Symptoms None       Comments Jonni Sanger is eager to continue progression. We added the cybex bike to his routine in order to provide more challenge. He continues to maintain TM intervals and jogs regularly at home.        Frequency Add 3 additional days to program exercise sessions.       Duration Progress to 50 minutes of aerobic without signs/symptoms of physical distress       Intensity Other (comment)  40-85% HRR 107-148bpm       Progression Continue progressive overload as per policy without signs/symptoms or physical distress.       Resistance Training   Training Prescription Yes       Weight 10       Reps 10-15       Interval Training   Interval Training Yes       Equipment Treadmill       Comments 3.4/0 - 5.7/1%       Treadmill   MPH 5.7       Grade 1       Minutes 20       NuStep   Level 6       Watts 40       Minutes 15          Discharge Exercise Prescription (Final Exercise Prescription Changes):     Exercise Prescription Changes - 05/10/15 0733    Exercise Review   Progression Yes   Response to Exercise   Blood Pressure (Admit) 142/72 mmHg   Blood Pressure (Exercise) 208/72 mmHg   Blood Pressure (Exit) 120/80 mmHg   Heart Rate (Admit) 84 bpm   Heart Rate (Exercise) 140 bpm   Heart Rate (Exit) 83 bpm   Rating of Perceived Exertion  (Exercise) 14   Symptoms None   Comments Jonni Sanger is eager to continue progression. We added the cybex bike to his routine in order to provide more challenge. He continues to maintain TM intervals and jogs regularly at home.    Frequency Add 3 additional days to program exercise sessions.   Duration Progress to 50 minutes of aerobic without signs/symptoms of physical distress   Intensity Other (comment)  40-85% HRR 107-148bpm   Progression Continue progressive overload as per policy without signs/symptoms or physical distress.   Resistance Training   Training Prescription Yes   Weight 10   Reps 10-15   Interval Training   Interval Training Yes   Equipment Treadmill  Comments 3.4/0 - 5.7/1%   Treadmill   MPH 5.7   Grade 1   Minutes 20   NuStep   Level 6   Watts 40   Minutes 15      Nutrition:  Target Goals: Understanding of nutrition guidelines, daily intake of sodium '1500mg'$ , cholesterol '200mg'$ , calories 30% from fat and 7% or less from saturated fats, daily to have 5 or more servings of fruits and vegetables.  Biometrics:     Pre Biometrics - 03/12/15 1424    Pre Biometrics   Height 6' (1.829 m)   Weight 196 lb (88.905 kg)   Waist Circumference 36 inches   Hip Circumference 42 inches   Waist to Hip Ratio 0.86 %   BMI (Calculated) 26.6       Nutrition Therapy Plan and Nutrition Goals:     Nutrition Therapy & Goals - 04/03/15 1142    Nutrition Therapy   Diet 2000kcal DASH diet, '1500mg'$  sodium ('2000mg'$  or less)   Fiber 30 grams   Whole Grain Foods 3 servings   Protein 12 ounces/day  ounces of protein foods   Saturated Fats 15 max. grams  maximum grams   Fruits and Vegetables 5 servings/day  or more servings   Personal Nutrition Goals   Personal Goal #1 control food portions at home and especially at restaurants   Personal Goal #2 Keep controlling sugar intake (less than or = 50g daily, or 10% or calories)   Personal Goal #3 Increase vegetables and fruits, plan  to have 1 or more with every meal.    Comments patient seems to have good understanding of nutrition concepts, able to state his own goals.      Nutrition Discharge: Rate Your Plate Scores:     Rate Your Plate - 27/25/36 6440    Rate Your Plate Scores   Pre Score 73   Pre Score % 81 %      Nutrition Goals Re-Evaluation:     Nutrition Goals Re-Evaluation      05/10/15 1153           Personal Goal #1 Re-Evaluation   Goal Progress Seen Yes       Personal Goal #2 Re-Evaluation   Personal Goal #2 Mitzi Hansen met with the dietician and he said he and his wife have made some changes including atting blueberries to his oatmeal with almonds in the am. He said they have been reading labels better to decrease thieir sugar intake and rarely eat pizza now.        Goal Progress Seen Yes       Personal Goal #3 Re-Evaluation   Personal Goal #3 Jaxxon met with the dietician and he said he and his wife have made some changes including atting blueberries to his oatmeal with almonds in the am. He said they have been reading labels better to decrease thieir sugar intake and rarely eat pizza now.        Goal Progress Seen Yes          Psychosocial: Target Goals: Acknowledge presence or absence of depression, maximize coping skills, provide positive support system. Participant is able to verbalize types and ability to use techniques and skills needed for reducing stress and depression.  Initial Review & Psychosocial Screening:     Initial Psych Review & Screening - 03/12/15 [place]? Yes   Comments Family lives close to home   Barriers   Psychosocial barriers  to participate in program There are no identifiable barriers or psychosocial needs.;The patient should benefit from training in stress management and relaxation.   Screening Interventions   Interventions Encouraged to exercise      Quality of Life Scores:     Quality of Life - 03/12/15 1818     Quality of Life Scores   Health/Function Pre 24.63 %   Socioeconomic Pre 27.5 %   Psych/Spiritual Pre 27.93 %   Family Pre 30 %   GLOBAL Pre 26.69 %      PHQ-9:     Recent Review Flowsheet Data    Depression screen [facility] 2/9 03/12/2015 02/12/2015   Decreased Interest 0 0   Down, Depressed, Hopeless 0 0   PHQ - 2 Score 0 0   Altered sleeping 0 -   Tired, decreased energy 1 -   Change in appetite 0 -   Feeling bad or failure about yourself  0 -   Trouble concentrating 0 -   Moving slowly or fidgety/restless 0 -   Suicidal thoughts 0 -   PHQ-9 Score 1 -   Difficult doing work/chores Not difficult at all -      Psychosocial Evaluation and Intervention:     Psychosocial Evaluation - 04/24/15 0943    Psychosocial Evaluation & Interventions   Interventions Encouraged to exercise with the program and follow exercise prescription   Comments Counselor met with Mr. Belcher today for initial psychosocial evaluation.  He is a 70 year old well adjusted individual who had a stent inserted on 02/28/15.  He has a strong support system of 38 years and has adult children who live close by as well as active involvement in his local faith community.  Mr. Cull reports sleeping well, having a good appetite and denies current or past symptoms of depression or anxiety.  He is typically positive and states he has minimal stress now that he has retired last year.  His goals for this program are to educate him on the limits of exercise so that he can get back to running regularly and hopefully participate in a half marathon this April.  He is a member of a gym and is extremely active so he will be able to maintain any progress made in this program with great ease.        Psychosocial Re-Evaluation:     Psychosocial Re-Evaluation      05/10/15 1158           Psychosocial Re-Evaluation   Interventions Encouraged to attend Cardiac Rehabilitation for the exercise       Comments Athen said he feels  that Cardiac Rehab has helped him. He has taken the [facility] class and will be volunteering on the Cardiac Floor upstairs. He said he hopes to encourage people to attend Cardiac Rehab since it has helped him.          Vocational Rehabilitation: Provide vocational rehab assistance to qualifying candidates.   Vocational Rehab Evaluation & Intervention:     Vocational Rehab - 03/12/15 1323    Initial Vocational Rehab Evaluation & Intervention   Assessment shows need for Vocational Rehabilitation No      Education: Education Goals: Education classes will be provided on a weekly basis, covering required topics. Participant will state understanding/return demonstration of topics presented.  Learning Barriers/Preferences:     Learning Barriers/Preferences - 03/12/15 1322    Learning Barriers/Preferences   Learning Barriers None   Learning Preferences None  Education Topics: General Nutrition Guidelines/Fats and Fiber: -Group instruction provided by verbal, written material, models and posters to present the general guidelines for heart healthy nutrition. Gives an explanation and review of dietary fats and fiber.          Cardiac Rehab from 05/08/2015 in [facility] Cardiac and Pulmonary Rehab   Date  05/08/15   Educator  CR   Instruction Review Code  2- meets goals/outcomes      Controlling Sodium/Reading Food Labels: -Group verbal and written material supporting the discussion of sodium use in heart healthy nutrition. Review and explanation with models, verbal and written materials for utilization of the food label.   Exercise Physiology & Risk Factors: - Group verbal and written instruction with models to review the exercise physiology of the cardiovascular system and associated critical values. Details cardiovascular disease risk factors and the goals associated with each risk factor.   Aerobic Exercise & Resistance Training: - Gives group verbal and written discussion  on the health impact of inactivity. On the components of aerobic and resistive training programs and the benefits of this training and how to safely progress through these programs.      Cardiac Rehab from 05/08/2015 in [facility] Cardiac and Pulmonary Rehab   Date  04/17/15   Educator  RM   Instruction Review Code  2- meets goals/outcomes      Flexibility, Balance, General Exercise Guidelines: - Provides group verbal and written instruction on the benefits of flexibility and balance training programs. Provides general exercise guidelines with specific guidelines to those with heart or lung disease. Demonstration and skill practice provided.      Cardiac Rehab from 05/08/2015 in [facility] Cardiac and Pulmonary Rehab   Date  04/17/15   Educator  RM   Instruction Review Code  2- meets goals/outcomes      Stress Management: - Provides group verbal and written instruction about the health risks of elevated stress, cause of high stress, and healthy ways to reduce stress.      Cardiac Rehab from 05/08/2015 in [facility] Cardiac and Pulmonary Rehab   Date  04/12/15   Educator  CE   Instruction Review Code  2- meets goals/outcomes      Depression: - Provides group verbal and written instruction on the correlation between heart/lung disease and depressed mood, treatment options, and the stigmas associated with seeking treatment.   Anatomy & Physiology of the Heart: - Group verbal and written instruction and models provide basic cardiac anatomy and physiology, with the coronary electrical and arterial systems. Review of: AMI, Angina, Valve disease, Heart Failure, Cardiac Arrhythmia, Pacemakers, and the ICD.      Cardiac Rehab from 05/08/2015 in [facility] Cardiac and Pulmonary Rehab   Date  04/19/15   Educator  CE   Instruction Review Code  2- meets goals/outcomes      Cardiac Procedures: - Group verbal and written instruction and models to describe the testing methods done to diagnose heart disease. Reviews the  outcomes of the test results. Describes the treatment choices: Medical Management, Angioplasty, or Coronary Bypass Surgery.      Cardiac Rehab from 05/08/2015 in [facility] Cardiac and Pulmonary Rehab   Date  03/20/15   Educator  DW   Instruction Review Code  2- meets goals/outcomes      Cardiac Medications: - Group verbal and written instruction to review commonly prescribed medications for heart disease. Reviews the medication, class of the drug, and side effects. Includes the steps to properly store meds  and maintain the prescription regimen.      Cardiac Rehab from 05/08/2015 in [facility] Cardiac and Pulmonary Rehab   Date  03/29/15   Educator  CE   Instruction Review Code  2- meets goals/outcomes      Go Sex-Intimacy & Heart Disease, Get SMART - Goal Setting: - Group verbal and written instruction through game format to discuss heart disease and the return to sexual intimacy. Provides group verbal and written material to discuss and apply goal setting through the application of the S.M.A.R.T. Method.      Cardiac Rehab from 05/08/2015 in [facility] Cardiac and Pulmonary Rehab   Date  03/20/15   Educator  DW   Instruction Review Code  2- meets goals/outcomes      Other Matters of the Heart: - Provides group verbal, written materials and models to describe Heart Failure, Angina, Valve Disease, and Diabetes in the realm of heart disease. Includes description of the disease process and treatment options available to the cardiac patient.      Cardiac Rehab from 05/08/2015 in [facility] Cardiac and Pulmonary Rehab   Date  05/01/15   Educator  DW   Instruction Review Code  2- meets goals/outcomes      Exercise & Equipment Safety: - Individual verbal instruction and demonstration of equipment use and safety with use of the equipment.      Cardiac Rehab from 05/08/2015 in [facility] Cardiac and Pulmonary Rehab   Date  03/12/15   Educator  SB   Instruction Review Code  2- meets goals/outcomes      Infection  Prevention: - Provides verbal and written material to individual with discussion of infection control including proper hand washing and proper equipment cleaning during exercise session.      Cardiac Rehab from 05/08/2015 in [facility] Cardiac and Pulmonary Rehab   Date  03/12/15   Educator  Sb   Instruction Review Code  2- meets goals/outcomes      Falls Prevention: - Provides verbal and written material to individual with discussion of falls prevention and safety.      Cardiac Rehab from 05/08/2015 in [facility] Cardiac and Pulmonary Rehab   Date  03/12/15   Educator  SB   Instruction Review Code  2- meets goals/outcomes      Diabetes: - Individual verbal and written instruction to review signs/symptoms of diabetes, desired ranges of glucose level fasting, after meals and with exercise. Advice that pre and post exercise glucose checks will be done for 3 sessions at entry of program.    Knowledge Questionnaire Score:     Knowledge Questionnaire Score - 03/12/15 1322    Knowledge Questionnaire Score   Pre Score 22/28      Personal Goals and Risk Factors at Admission:     Personal Goals and Risk Factors at Admission - 03/29/15 1151    Personal Goals and Risk Factors on Admission    Weight Management Yes   Intervention Learn and follow the exercise and diet guidelines while in the program. Utilize the nutrition and education classes to help gain knowledge of the diet and exercise expectations in the program      Personal Goals and Risk Factors Review:      Goals and Risk Factor Review      03/28/15 0856 03/29/15 1151 03/29/15 1152 04/03/15 1019 05/10/15 1154   Weight Management   Goals Progress/Improvement seen  Yes Yes  Yes   Comments   Aziz wants to lose a couple of lbs  so it will be easier to run a half marathon again.   Jewel has lost weight but he wants to lose 5-6 lbs more. He is almost at goal weight but since he runs half marathons or his goal is to run another half marathon  he wants to lose weight.    Increase Aerobic Exercise and Physical Activity   Goals Progress/Improvement seen  Yes   Yes Yes   Comments Cristobal has been progressing very well and very quickly due to his history of being physically active. He was a runner before his heart procedure and wants to get back to that. He can continuously exercise for the entire class time and in order to continue to challenge him we have increaed his TM speed and NS workload. He went for a walk/jog over Christmas weekend with his wife and averaged an 11.5 min/mile over 2 miles. He is going to continue accumulating exercise minutes at home outside of class.    Jonni Sanger did a 35mle walk/jog at home over the weekend and felt really good. Today in class, he ran for 8 continuous minutes on the treadmill for the first time in a year. He is very excited about his progress.  AZaccharytried the upright bike on level 8 since he wanted to challenge himself and max his heart rate. On level 8 his heart rate was 110. When he went to level 10 his heart rate went higher to 126 and his blood pressure really increased 208/72 . No c/o and he said he really felt a workout with the upright bike.   Hypertension   Goal   Participant will see blood pressure controlled within the values of 140/951mHg or within value directed by their physician.     Progress seen toward goals   Yes  Yes   Comments   Blood pressure is good at 120/70 on his blood pressure medicine.   blood pressure 128-142/72 upon arrival to Cardiac Rehab.    Abnormal Lipids   Progress seen towards goals   Unknown  Unknown   Comments   Will have follow up blood work with his MD.   AnEdgeleports his MD follows up on his lipid panel blood work.       Personal Goals Discharge (Final Personal Goals and Risk Factors Review):      Goals and Risk Factor Review - 05/10/15 1154    Weight Management   Goals Progress/Improvement seen Yes   Comments AnEulogioas lost weight but he wants to lose 5-6  lbs more. He is almost at goal weight but since he runs half marathons or his goal is to run another half marathon he wants to lose weight.    Increase Aerobic Exercise and Physical Activity   Goals Progress/Improvement seen  Yes   Comments AnMubarakried the upright bike on level 8 since he wanted to challenge himself and max his heart rate. On level 8 his heart rate was 110. When he went to level 10 his heart rate went higher to 126 and his blood pressure really increased 208/72 . No c/o and he said he really felt a workout with the upright bike.   Hypertension   Progress seen toward goals Yes   Comments blood pressure 128-142/72 upon arrival to Cardiac Rehab.    Abnormal Lipids   Progress seen towards goals Unknown   Comments AnCostaeports his MD follows up on his lipid panel blood work.       ITP Comments:  ITP Comments      03/12/15 1511 04/01/15 1335 04/26/15 1314 05/10/15 1151 05/22/15 0903   ITP Comments Initial ITP Ready for 30 day review.  Continue with ITP Ready for 30 day review. Continue with ITP. Jonni Sanger said his MD started him on Augmentin for a sinus infection since he has had a runny nose/cold congestion for a while. Ediel met with the dietician and he said he and his wife have made some changes including atting blueberries to his oatmeal with almonds in the am. He said they have been reading labels better to decrease thieir sugar intake and rarely eat pizza now.  30 Day Review.  Continue with ITP.      Comments:

## 2015-05-24 DIAGNOSIS — Z9861 Coronary angioplasty status: Secondary | ICD-10-CM

## 2015-05-24 NOTE — Progress Notes (Signed)
Daily Session Note  Patient Details  Name: Hector Woodard MRN: [id number] Date of Birth: [birth year] Referring Provider:  Yolonda Kida, MD  Encounter Date: 05/24/2015  Check In:     Session Check In - 05/24/15 0856    Check-In   Location [facility]   Staff Present Gerlene Burdock, RN, BSN;Makeila Yamaguchi, BS, ACSM EP-C, Exercise Physiologist;Diane Joya Gaskins, RN, BSN   Supervising physician immediately available to respond to emergencies See telemetry face sheet for immediately available ER MD   Medication changes reported     No   Fall or balance concerns reported    No   Warm-up and Cool-down Performed on first and last piece of equipment   Resistance Training Performed No   VAD Patient? No   Pain Assessment   Currently in Pain? No/denies         Goals Met:  Proper associated with RPD/PD & O2 Sat Exercise tolerated well No report of cardiac concerns or symptoms Strength training completed today  Goals Unmet:  Not Applicable  Goals Comments:    Dr. Emily Filbert is Medical Director for [place] and [facility].

## 2015-05-29 ENCOUNTER — Encounter: Payer: Commercial Managed Care - HMO | Admitting: *Deleted

## 2015-05-29 DIAGNOSIS — Z9861 Coronary angioplasty status: Secondary | ICD-10-CM | POA: Diagnosis not present

## 2015-05-29 NOTE — Progress Notes (Signed)
Daily Session Note  Patient Details  Name: Hector Woodard MRN: [id number] Date of Birth: [birth year] Referring Provider:  Yolonda Kida, MD  Encounter Date: 05/29/2015  Check In:     Session Check In - 05/29/15 0925    Check-In   Location [facility]   Staff Present Nyoka Cowden, RN;Rebecca Sickles, PT;Diane Joya Gaskins, RN, BSN   Supervising physician immediately available to respond to emergencies See telemetry face sheet for immediately available ER MD   Medication changes reported     No   Fall or balance concerns reported    No   Warm-up and Cool-down Performed on first and last piece of equipment   Resistance Training Performed Yes   VAD Patient? No   Pain Assessment   Currently in Pain? No/denies         Goals Met:  Independence with exercise equipment Exercise tolerated well No report of cardiac concerns or symptoms Strength training completed today  Goals Unmet:  Not Applicable  Comments:  Patient completed exercise prescription and all exercise goals during rehab session. The exercise was tolerated well and the patient is progressing in the program.    Dr. Emily Filbert is Medical Director for [place] [facility].

## 2015-05-30 NOTE — Addendum Note (Signed)
Addended by: Lynford Humphrey on: 05/30/2015 10:53 AM   Modules accepted: Orders

## 2015-05-31 ENCOUNTER — Encounter: Payer: Commercial Managed Care - HMO | Attending: Internal Medicine

## 2015-05-31 DIAGNOSIS — Z9861 Coronary angioplasty status: Secondary | ICD-10-CM | POA: Diagnosis not present

## 2015-05-31 NOTE — Progress Notes (Signed)
Daily Session Note  Patient Details  Name: Hector Woodard MRN: [id number] Date of Birth: 07-23-1945 Referring Provider:  Yolonda Kida, MD  Encounter Date: 05/31/2015  Check In:     Session Check In - 05/31/15 0908    Check-In   Location [facility]   Staff Present Gerlene Burdock, RN, BSN;Betzalel Umbarger, BS, ACSM EP-C, Exercise Physiologist;Rebecca Brayton El, PT   Supervising physician immediately available to respond to emergencies See telemetry face sheet for immediately available ER MD   Medication changes reported     No   Fall or balance concerns reported    No   Warm-up and Cool-down Performed on first and last piece of equipment   Resistance Training Performed No   VAD Patient? No   Pain Assessment   Currently in Pain? No/denies         Goals Met:  Proper associated with RPD/PD & O2 Sat Exercise tolerated well No report of cardiac concerns or symptoms Strength training completed today  Goals Unmet:  Not Applicable  Comments:    Dr. Emily Filbert is Medical Director for [place] and [facility].

## 2015-06-05 ENCOUNTER — Encounter: Payer: Commercial Managed Care - HMO | Admitting: *Deleted

## 2015-06-05 DIAGNOSIS — Z9861 Coronary angioplasty status: Secondary | ICD-10-CM

## 2015-06-05 NOTE — Progress Notes (Signed)
Daily Session Note  Patient Details  Name: Hector Woodard MRN: [id number] Date of Birth: June 29, 1945 Referring Provider:  Yolonda Kida, MD  Encounter Date: 06/05/2015  Check In:     Session Check In - 06/05/15 0903    Check-In   Location [facility]   Staff Present Candiss Norse, MS, ACSM CEP, Exercise Physiologist;Susanne Bice, RN, BSN, CCRP;Rebecca Sickles, PT   Supervising physician immediately available to respond to emergencies See telemetry face sheet for immediately available ER MD   Medication changes reported     No   Fall or balance concerns reported    No   Warm-up and Cool-down Performed on first and last piece of equipment   Resistance Training Performed Yes   VAD Patient? No   Pain Assessment   Currently in Pain? No/denies   Multiple Pain Sites No         Goals Met:  Independence with exercise equipment Exercise tolerated well No report of cardiac concerns or symptoms Strength training completed today  Goals Unmet:  Not Applicable  Comments: Patient completed exercise prescription and all exercise goals during rehab session. The exercise was tolerated well and the patient is progressing in the program.    Dr. Emily Filbert is Medical Director for [place] and [facility].

## 2015-06-07 DIAGNOSIS — Z9861 Coronary angioplasty status: Secondary | ICD-10-CM | POA: Diagnosis not present

## 2015-06-07 NOTE — Progress Notes (Signed)
Cardiac Individual Treatment Plan  Patient Details  Name: Hector Woodard MRN: [id number] Date of Birth: [birth year] Referring Provider:  Yolonda Kida, MD  Initial Encounter Date:    Visit Diagnosis: S/P PTCA (percutaneous transluminal coronary angioplasty)  Patient's Home Medications on Admission:  Current outpatient prescriptions:  .  amoxicillin-clavulanate (AUGMENTIN) 875-125 MG tablet, Take 1 tablet by mouth 2 (two) times daily., Disp: 20 tablet, Rfl: 0 .  aspirin EC 81 MG tablet, Take 81 mg by mouth daily., Disp: , Rfl:  .  atorvastatin (LIPITOR) 40 MG tablet, 40 mg at bedtime., Disp: , Rfl:  .  clopidogrel (PLAVIX) 75 MG tablet, Take 1 tablet (75 mg total) by mouth daily., Disp: 30 tablet, Rfl: 11 .  metoprolol succinate (TOPROL-XL) 25 MG 24 hr tablet, Take 1 tablet (25 mg total) by mouth daily. (Patient taking differently: Take 12.5 mg by mouth daily. ), Disp: 30 tablet, Rfl: 11 .  Multiple Vitamin (MULTIVITAMIN) capsule, Take 1 capsule by mouth daily., Disp: , Rfl:  .  Omega-3 Fatty Acids (FISH OIL) 1000 MG CAPS, Take 1 capsule by mouth at bedtime., Disp: , Rfl:   Past Medical History: Past Medical History  Diagnosis Date  . Hypercholesteremia   . Vertigo 2015  . PIN (prostatic intraepithelial neoplasia)   . Anginal pain ([place])   . Hypertension     Tobacco Use: History  Smoking status  . Never Smoker   Smokeless tobacco  . Never Used    Labs: Recent Review Flowsheet Data    Labs for ITP Cardiac and Pulmonary Rehab Latest Ref Rng 02/12/2015 04/24/2015 04/24/2015   Cholestrol - 180 151 CANCELED   LDLCALC 0 - 99 mg/dL 92 - -   HDL - 70 63 CANCELED   Trlycerides - 91 74 CANCELED       Exercise Target Goals:    Exercise Program Goal: Individual exercise prescription set with THRR, safety & activity barriers. Participant demonstrates ability to understand and report RPE using BORG scale, to self-measure pulse accurately, and to acknowledge the importance of  the exercise prescription.  Exercise Prescription Goal: Starting with aerobic activity 30 plus minutes a day, 3 days per week for initial exercise prescription. Provide home exercise prescription and guidelines that participant acknowledges understanding prior to discharge.  Activity Barriers & Risk Stratification:     Activity Barriers & Cardiac Risk Stratification - 03/12/15 1522    Activity Barriers & Cardiac Risk Stratification   Cardiac Risk Stratification --  3 vessel disease >60% all three       6 Minute Walk:     6 Minute Walk      03/12/15 1421       6 Minute Walk   Phase Initial     Distance 1642 feet     Walk Time 6 minutes     RPE 7     Symptoms No     Resting HR 68 bpm     Resting BP 124/72 mmHg     Max Ex. HR 98 bpm     Max Ex. BP 140/60 mmHg        Initial Exercise Prescription:     Initial Exercise Prescription - 03/12/15 1400    Date of Initial Exercise Prescription   Date 03/12/15   Treadmill   MPH 3.1   Grade 0   Minutes 15   Bike   Level 1.5   Minutes 15   Recumbant Bike   Level 3   RPM 50  Minutes 15   NuStep   Level 4   Watts 60   Minutes 15   Arm Ergometer   Level 2   Watts 10   Minutes 15   Arm/Foot Ergometer   Level 2   Watts 15   Minutes 15   Cybex   Level 3   RPM 50   Minutes 15   Recumbant Elliptical   Level 2   Watts 30   Minutes 15   Elliptical   Level 1   Speed 3   Minutes 15   REL-XR   Level 4   Watts 60   Minutes 15   T5 Nustep   Level 3   Watts 30   Minutes 15   Biostep-RELP   Level 4   Watts 60   Minutes 15   Prescription Details   Frequency (times per week) 3   Duration Progress to 30 minutes of continuous aerobic without signs/symptoms of physical distress   Intensity   THRR REST +  30   Ratings of Perceived Exertion 11-15   Progression   Progression Continue progressive overload as per policy without signs/symptoms or physical distress.   Resistance Training   Training Prescription  Yes   Weight 3   Reps 10-15      Exercise Prescription Changes:     Exercise Prescription Changes      03/12/15 1500 03/20/15 0900 03/29/15 1400 04/03/15 1000 04/17/15 0700   Exercise Review   Progression    Yes    Response to Exercise   Blood Pressure (Admit) 124/72 mmHg  116/64 mmHg  126/70 mmHg   Blood Pressure (Exercise) 140/60 mmHg  148/78 mmHg  142/78 mmHg   Blood Pressure (Exit) 114/66 mmHg  110/62 mmHg  120/74 mmHg   Heart Rate (Admit) 68 bpm  73 bpm  77 bpm   Heart Rate (Exercise) 98 bpm  109 bpm  121 bpm   Heart Rate (Exit) 66 bpm  78 bpm  74 bpm   Rating of Perceived Exertion (Exercise) '7  12  13   '$ Symptoms none reported during walk  none none none   Comments  First day of exercise! Patient was oriented to equipment and exercise prescription was discussed with patient. Patient was able to complete exercise in class today with no signs or symptoms.   A new target heart rate range for exercise was calculated based on the patient's ability to exercise with increased intensity. The range is 40-85% of HRR and encompasses moderate-vigorous exercise. The target heart rate range is equivalent to an RPE of 12-17. Met with Hector Woodard about changing his THR to 40-85% of HRR.He has been instructed to use this THR when he exercises at home.      Duration   Progress to 50 minutes of aerobic without signs/symptoms of physical distress Progress to 50 minutes of aerobic without signs/symptoms of physical distress Progress to 50 minutes of aerobic without signs/symptoms of physical distress   Intensity   THRR unchanged Other (comment)  40-85% HRR 107-148bpm Other (comment)  40-85% HRR 107-148bpm   Progression   Progression   Continue progressive overload as per policy without signs/symptoms or physical distress. Continue progressive overload as per policy without signs/symptoms or physical distress. Continue progressive overload as per policy without signs/symptoms or physical distress.   Resistance  Training   Training Prescription (read-only)   Yes Yes Yes   Weight (read-only)   '10 10 10   '$ Reps (read-only)   10-15  10-15 10-15   Interval Training   Interval Training   No No No   Treadmill   MPH (read-only)   3.5 3.5 3.5   Grade (read-only)   '5 5 5   '$ Minutes (read-only)   '20 20 20   '$ NuStep   Level (read-only)   '6 6 6   '$ Watts (read-only)   40 40 40   Minutes (read-only)   '15 15 15     '$ 05/10/15 0733           Exercise Review   Progression Yes       Response to Exercise   Blood Pressure (Admit) 142/72 mmHg       Blood Pressure (Exercise) 208/72 mmHg       Blood Pressure (Exit) 120/80 mmHg       Heart Rate (Admit) 84 bpm       Heart Rate (Exercise) 140 bpm       Heart Rate (Exit) 83 bpm       Rating of Perceived Exertion (Exercise) 14       Symptoms None       Comments Hector Woodard is eager to continue progression. We added the cybex bike to his routine in order to provide more challenge. He continues to maintain TM intervals and jogs regularly at home.        Frequency Add 3 additional days to program exercise sessions.       Duration Progress to 50 minutes of aerobic without signs/symptoms of physical distress       Intensity Other (comment)  40-85% HRR 107-148bpm       Progression   Progression Continue progressive overload as per policy without signs/symptoms or physical distress.       Resistance Training   Training Prescription (read-only) Yes       Weight (read-only) 10       Reps (read-only) 10-15       Interval Training   Interval Training Yes       Equipment Treadmill       Comments 3.4/0 - 5.7/1%       Treadmill   MPH (read-only) 5.7       Grade (read-only) 1       Minutes (read-only) 20       NuStep   Level (read-only) 6       Watts (read-only) 40       Minutes (read-only) 15          Discharge Exercise Prescription (Final Exercise Prescription Changes):     Exercise Prescription Changes - 05/10/15 0733    Exercise Review   Progression Yes   Response  to Exercise   Blood Pressure (Admit) 142/72 mmHg   Blood Pressure (Exercise) 208/72 mmHg   Blood Pressure (Exit) 120/80 mmHg   Heart Rate (Admit) 84 bpm   Heart Rate (Exercise) 140 bpm   Heart Rate (Exit) 83 bpm   Rating of Perceived Exertion (Exercise) 14   Symptoms None   Comments Hector Woodard is eager to continue progression. We added the cybex bike to his routine in order to provide more challenge. He continues to maintain TM intervals and jogs regularly at home.    Frequency Add 3 additional days to program exercise sessions.   Duration Progress to 50 minutes of aerobic without signs/symptoms of physical distress   Intensity Other (comment)  40-85% HRR 107-148bpm   Progression   Progression Continue progressive overload as per policy without signs/symptoms or physical distress.  Resistance Training   Training Prescription (read-only) Yes   Weight (read-only) 10   Reps (read-only) 10-15   Interval Training   Interval Training Yes   Equipment Treadmill   Comments 3.4/0 - 5.7/1%   Treadmill   MPH (read-only) 5.7   Grade (read-only) 1   Minutes (read-only) 20   NuStep   Level (read-only) 6   Watts (read-only) 40   Minutes (read-only) 15      Nutrition:  Target Goals: Understanding of nutrition guidelines, daily intake of sodium '1500mg'$ , cholesterol '200mg'$ , calories 30% from fat and 7% or less from saturated fats, daily to have 5 or more servings of fruits and vegetables.  Biometrics:     Pre Biometrics - 03/12/15 1424    Pre Biometrics   Height 6' (1.829 m)   Weight 196 lb (88.905 kg)   Waist Circumference 36 inches   Hip Circumference 42 inches   Waist to Hip Ratio 0.86 %   BMI (Calculated) 26.6       Nutrition Therapy Plan and Nutrition Goals:     Nutrition Therapy & Goals - 04/03/15 1142    Nutrition Therapy   Diet 2000kcal DASH diet, '1500mg'$  sodium ('2000mg'$  or less)   Protein (oz) (read-only) 12 ounces/day  ounces of protein foods   Fiber 30 grams   Whole  Grain Foods 3 servings   Saturated Fats 15 max. grams  maximum grams   Fruits and Vegetables 5 servings/day  or more servings   Personal Nutrition Goals   Personal Goal #1 control food portions at home and especially at restaurants   Personal Goal #2 Keep controlling sugar intake (less than or = 50g daily, or 10% or calories)   Personal Goal #3 Increase vegetables and fruits, plan to have 1 or more with every meal.    Comments patient seems to have good understanding of nutrition concepts, able to state his own goals.      Nutrition Discharge: Rate Your Plate Scores:     Nutrition Assessments - 03/29/15 0943    Rate Your Plate Scores   Pre Score 73   Pre Score % 81 %      Nutrition Goals Re-Evaluation:     Nutrition Goals Re-Evaluation      05/10/15 1153 06/07/15 1127         Personal Goal #1 Re-Evaluation   Goal Progress Seen Yes Yes      Personal Goal #2 Re-Evaluation   Personal Goal #2 Mitzi Hansen met with the dietician and he said he and his wife have made some changes including atting blueberries to his oatmeal with almonds in the am. He said they have been reading labels better to decrease thieir sugar intake and rarely eat pizza now.        Goal Progress Seen Yes Yes      Comments  Naod reports he and his wife have cut back on their sodium and sugar. "I never realized how much damage sugar can do to one's heart vessels and blood vessels.      Personal Goal #3 Re-Evaluation   Personal Goal #3 Hector Woodard met with the dietician and he said he and his wife have made some changes including atting blueberries to his oatmeal with almonds in the am. He said they have been reading labels better to decrease thieir sugar intake and rarely eat pizza now.        Goal Progress Seen Yes          Psychosocial: Target  Goals: Acknowledge presence or absence of depression, maximize coping skills, provide positive support system. Participant is able to verbalize types and ability to use  techniques and skills needed for reducing stress and depression.  Initial Review & Psychosocial Screening:     Initial Psych Review & Screening - 03/12/15 [facility]? Yes   Comments Family lives close to home   Barriers   Psychosocial barriers to participate in program There are no identifiable barriers or psychosocial needs.;The patient should benefit from training in stress management and relaxation.   Screening Interventions   Interventions Encouraged to exercise      Quality of Life Scores:     Quality of Life - 03/12/15 1818    Quality of Life Scores   Health/Function Pre 24.63 %   Socioeconomic Pre 27.5 %   Psych/Spiritual Pre 27.93 %   Family Pre 30 %   GLOBAL Pre 26.69 %      PHQ-9:     Recent Review Flowsheet Data    Depression screen [facility] 2/9 03/12/2015 02/12/2015   Decreased Interest 0 0   Down, Depressed, Hopeless 0 0   PHQ - 2 Score 0 0   Altered sleeping 0 -   Tired, decreased energy 1 -   Change in appetite 0 -   Feeling bad or failure about yourself  0 -   Trouble concentrating 0 -   Moving slowly or fidgety/restless 0 -   Suicidal thoughts 0 -   PHQ-9 Score 1 -   Difficult doing work/chores Not difficult at all -      Psychosocial Evaluation and Intervention:     Psychosocial Evaluation - 04/24/15 0943    Psychosocial Evaluation & Interventions   Interventions Encouraged to exercise with the program and follow exercise prescription   Comments Counselor met with Mr. Uzzle today for initial psychosocial evaluation.  He is a 70 year old well adjusted individual who had a stent inserted on 02/28/15.  He has a strong support system of 38 years and has adult children who live close by as well as active involvement in his local faith community.  Mr. Kubitz reports sleeping well, having a good appetite and denies current or past symptoms of depression or anxiety.  He is typically positive and states he has minimal stress  now that he has retired last year.  His goals for this program are to educate him on the limits of exercise so that he can get back to running regularly and hopefully participate in a half marathon this April.  He is a member of a gym and is extremely active so he will be able to maintain any progress made in this program with great ease.        Psychosocial Re-Evaluation:     Psychosocial Re-Evaluation      05/10/15 1158 06/07/15 1126         Psychosocial Re-Evaluation   Interventions Encouraged to attend Cardiac Rehabilitation for the exercise       Comments Michaelanthony said he feels that Cardiac Rehab has helped him. He has taken the [place] class and will be volunteering on the Cardiac Floor upstairs. He said he hopes to encourage people to attend Cardiac Rehab since it has helped him. Noeh repeated again Cardiac REhab has been helpful and he feels he can do alot since exercsing on the upright bike esp.          Vocational Rehabilitation:  Provide vocational rehab assistance to qualifying candidates.   Vocational Rehab Evaluation & Intervention:     Vocational Rehab - 03/12/15 1323    Initial Vocational Rehab Evaluation & Intervention   Assessment shows need for Vocational Rehabilitation No      Education: Education Goals: Education classes will be provided on a weekly basis, covering required topics. Participant will state understanding/return demonstration of topics presented.  Learning Barriers/Preferences:     Learning Barriers/Preferences - 03/12/15 1322    Learning Barriers/Preferences   Learning Barriers None   Learning Preferences None      Education Topics: General Nutrition Guidelines/Fats and Fiber: -Group instruction provided by verbal, written material, models and posters to present the general guidelines for heart healthy nutrition. Gives an explanation and review of dietary fats and fiber.          Cardiac Rehab from 06/05/2015 in [facility] Cardiac and  Pulmonary Rehab   Date  05/08/15   Educator  CR   Instruction Review Code  2- meets goals/outcomes      Controlling Sodium/Reading Food Labels: -Group verbal and written material supporting the discussion of sodium use in heart healthy nutrition. Review and explanation with models, verbal and written materials for utilization of the food label.   Exercise Physiology & Risk Factors: - Group verbal and written instruction with models to review the exercise physiology of the cardiovascular system and associated critical values. Details cardiovascular disease risk factors and the goals associated with each risk factor.      Cardiac Rehab from 06/05/2015 in [facility] Cardiac and Pulmonary Rehab   Date  05/29/15   Educator  BS   Instruction Review Code  2- meets goals/outcomes      Aerobic Exercise & Resistance Training: - Gives group verbal and written discussion on the health impact of inactivity. On the components of aerobic and resistive training programs and the benefits of this training and how to safely progress through these programs.      Cardiac Rehab from 06/05/2015 in [facility] Cardiac and Pulmonary Rehab   Date  04/17/15   Educator  RM   Instruction Review Code  2- meets goals/outcomes      Flexibility, Balance, General Exercise Guidelines: - Provides group verbal and written instruction on the benefits of flexibility and balance training programs. Provides general exercise guidelines with specific guidelines to those with heart or lung disease. Demonstration and skill practice provided.      Cardiac Rehab from 06/05/2015 in [facility] Cardiac and Pulmonary Rehab   Date  06/05/15   Educator  RS   Instruction Review Code  2- meets goals/outcomes      Stress Management: - Provides group verbal and written instruction about the health risks of elevated stress, cause of high stress, and healthy ways to reduce stress.      Cardiac Rehab from 06/05/2015 in [facility] Cardiac and Pulmonary Rehab   Date   04/12/15   Educator  CE   Instruction Review Code  2- meets goals/outcomes      Depression: - Provides group verbal and written instruction on the correlation between heart/lung disease and depressed mood, treatment options, and the stigmas associated with seeking treatment.      Cardiac Rehab from 06/05/2015 in [facility] Cardiac and Pulmonary Rehab   Date  05/24/15   Educator  CE   Instruction Review Code  2- meets goals/outcomes      Anatomy & Physiology of the Heart: - Group verbal and written instruction and models provide  basic cardiac anatomy and physiology, with the coronary electrical and arterial systems. Review of: AMI, Angina, Valve disease, Heart Failure, Cardiac Arrhythmia, Pacemakers, and the ICD.      Cardiac Rehab from 06/05/2015 in [facility] Cardiac and Pulmonary Rehab   Date  04/19/15   Educator  CE   Instruction Review Code  2- meets goals/outcomes      Cardiac Procedures: - Group verbal and written instruction and models to describe the testing methods done to diagnose heart disease. Reviews the outcomes of the test results. Describes the treatment choices: Medical Management, Angioplasty, or Coronary Bypass Surgery.      Cardiac Rehab from 06/05/2015 in [facility] Cardiac and Pulmonary Rehab   Date  03/20/15   Educator  DW   Instruction Review Code  2- meets goals/outcomes      Cardiac Medications: - Group verbal and written instruction to review commonly prescribed medications for heart disease. Reviews the medication, class of the drug, and side effects. Includes the steps to properly store meds and maintain the prescription regimen.      Cardiac Rehab from 06/05/2015 in [facility] Cardiac and Pulmonary Rehab   Date  03/29/15   Educator  CE   Instruction Review Code  2- meets goals/outcomes      Go Sex-Intimacy & Heart Disease, Get SMART - Goal Setting: - Group verbal and written instruction through game format to discuss heart disease and the return to sexual intimacy. Provides  group verbal and written material to discuss and apply goal setting through the application of the S.M.A.R.T. Method.      Cardiac Rehab from 06/05/2015 in [facility] Cardiac and Pulmonary Rehab   Date  03/20/15   Educator  DW   Instruction Review Code  2- meets goals/outcomes      Other Matters of the Heart: - Provides group verbal, written materials and models to describe Heart Failure, Angina, Valve Disease, and Diabetes in the realm of heart disease. Includes description of the disease process and treatment options available to the cardiac patient.      Cardiac Rehab from 06/05/2015 in [facility] Cardiac and Pulmonary Rehab   Date  05/01/15   Educator  DW   Instruction Review Code  2- meets goals/outcomes      Exercise & Equipment Safety: - Individual verbal instruction and demonstration of equipment use and safety with use of the equipment.      Cardiac Rehab from 06/05/2015 in [facility] Cardiac and Pulmonary Rehab   Date  03/12/15   Educator  SB   Instruction Review Code  2- meets goals/outcomes      Infection Prevention: - Provides verbal and written material to individual with discussion of infection control including proper hand washing and proper equipment cleaning during exercise session.      Cardiac Rehab from 06/05/2015 in [facility] Cardiac and Pulmonary Rehab   Date  03/12/15   Educator  Sb   Instruction Review Code  2- meets goals/outcomes      Falls Prevention: - Provides verbal and written material to individual with discussion of falls prevention and safety.      Cardiac Rehab from 06/05/2015 in [facility] Cardiac and Pulmonary Rehab   Date  03/12/15   Educator  SB   Instruction Review Code  2- meets goals/outcomes      Diabetes: - Individual verbal and written instruction to review signs/symptoms of diabetes, desired ranges of glucose level fasting, after meals and with exercise. Advice that pre and post exercise glucose checks will  be done for 3 sessions at entry of  program.    Knowledge Questionnaire Score:     Knowledge Questionnaire Score - 03/12/15 1322    Knowledge Questionnaire Score   Pre Score 22/28      Personal Goals and Risk Factors at Admission:     Personal Goals and Risk Factors at Admission - 03/29/15 1151    Core Components/Risk Factors/Patient Goals on Admission    Weight Management Yes   Intervention (read-only) Learn and follow the exercise and diet guidelines while in the program. Utilize the nutrition and education classes to help gain knowledge of the diet and exercise expectations in the program      Personal Goals and Risk Factors Review:      Goals and Risk Factor Review      03/28/15 0856 03/29/15 1151 03/29/15 1152 04/03/15 1019 05/10/15 1154   Core Components/Risk Factors/Patient Goals Review   Personal Goals Review Increase Aerobic Exercise and Physical Activity Weight Management Hypertension;Lipids     Weight Management (read-only)   Goals Progress/Improvement seen  Yes Yes  Yes   Comments   Hector Woodard wants to lose a couple of lbs so it will be easier to run a half marathon again.   Hector Woodard has lost weight but he wants to lose 5-6 lbs more. He is almost at goal weight but since he runs half marathons or his goal is to run another half marathon he wants to lose weight.    Increase Aerobic Exercise and Physical Activity (read-only)   Goals Progress/Improvement seen  Yes   Yes Yes   Comments Hector Woodard has been progressing very well and very quickly due to his history of being physically active. He was a runner before his heart procedure and wants to get back to that. He can continuously exercise for the entire class time and in order to continue to challenge him we have increaed his TM speed and NS workload. He went for a walk/jog over Christmas weekend with his wife and averaged an 11.5 min/mile over 2 miles. He is going to continue accumulating exercise minutes at home outside of class.    Hector Woodard did a 54mle walk/jog at  home over the weekend and felt really good. Today in class, he ran for 8 continuous minutes on the treadmill for the first time in a year. He is very excited about his progress.  AWhalentried the upright bike on level 8 since he wanted to challenge himself and max his heart rate. On level 8 his heart rate was 110. When he went to level 10 his heart rate went higher to 126 and his blood pressure really increased 208/72 . No c/o and he said he really felt a workout with the upright bike.   Hypertension (read-only)   Goal   Participant will see blood pressure controlled within the values of 140/910mHg or within value directed by their physician.     Progress seen toward goals   Yes  Yes   Comments   Blood pressure is good at 120/70 on his blood pressure medicine.   blood pressure 128-142/72 upon arrival to Cardiac Rehab.    Abnormal Lipids (read-only)   Progress seen towards goals   Unknown  Unknown   Comments   Will have follow up blood work with his MD.   [employer] his MD follows up on his lipid panel blood work.      06/07/15 1125  Core Components/Risk Factors/Patient Goals Review   Personal Goals Review Hypertension;Lipids       Review Hector Woodard 's blood pressure is under control with his medicine and his cholestrol is checke by his MD.       Expected Outcomes Short term goal to keep blood pressure sys below 140. Longer term goal expected by graduation to keep blood pressure below 138/70.          Personal Goals Discharge (Final Personal Goals and Risk Factors Review):      Goals and Risk Factor Review - 06/07/15 1125    Core Components/Risk Factors/Patient Goals Review   Personal Goals Review Hypertension;Lipids   Review Hector Woodard 's blood pressure is under control with his medicine and his cholestrol is checke by his MD.   Expected Outcomes Short term goal to keep blood pressure sys below 140. Longer term goal expected by graduation to keep blood pressure below 138/70.       ITP Comments:     ITP Comments      03/12/15 1511 04/01/15 1335 04/26/15 1314 05/10/15 1151 05/22/15 0903   ITP Comments Initial ITP Ready for 30 day review.  Continue with ITP Ready for 30 day review. Continue with ITP. Hector Woodard said his MD started him on Augmentin for a sinus infection since he has had a runny nose/cold congestion for a while. Hector Woodard met with the dietician and he said he and his wife have made some changes including atting blueberries to his oatmeal with almonds in the am. He said they have been reading labels better to decrease thieir sugar intake and rarely eat pizza now.  30 Day Review.  Continue with ITP.     06/07/15 1124           ITP Comments Hector Woodard will use his [employer] research [employer], government and exercise at [employer] when he is done with his 36 sessions of Cardiac REhab.           Comments:

## 2015-06-07 NOTE — Progress Notes (Signed)
Daily Session Note  Patient Details  Name: KEOLA HENINGER MRN: [id number] Date of Birth: Dec 02, 1945 Referring Provider:  Guadalupe Maple, MD  Encounter Date: 06/07/2015  Check In:     Session Check In - 06/07/15 0919    Check-In   Location [facility]   Staff Present Gerlene Burdock, RN, BSN;Leisha Trinkle, BS, ACSM EP-C, Exercise Physiologist;Rebecca Brayton El, PT   Supervising physician immediately available to respond to emergencies See telemetry face sheet for immediately available ER MD   Medication changes reported     No   Fall or balance concerns reported    No   Warm-up and Cool-down Performed on first and last piece of equipment   Resistance Training Performed No   VAD Patient? No   Pain Assessment   Currently in Pain? No/denies         Goals Met:  Proper associated with RPD/PD & O2 Sat Exercise tolerated well No report of cardiac concerns or symptoms Strength training completed today  Goals Unmet:  Not Applicable  Comments:    Dr. Emily Filbert is Medical Director for [facility] and [facility].

## 2015-06-12 ENCOUNTER — Encounter: Payer: Commercial Managed Care - HMO | Admitting: *Deleted

## 2015-06-12 VITALS — Ht 72.0 in | Wt 188.4 lb

## 2015-06-12 DIAGNOSIS — Z9861 Coronary angioplasty status: Secondary | ICD-10-CM | POA: Diagnosis not present

## 2015-06-12 NOTE — Progress Notes (Signed)
Daily Session Note  Patient Details  Name: Hector Woodard MRN: [id number] Date of Birth: November 18, 1945 Referring Provider:  Yolonda Kida, MD  Encounter Date: 06/12/2015  Check In:     Session Check In - 06/12/15 1017    Check-In   Location [facility]   Staff Present Candiss Norse, MS, ACSM CEP, Exercise Physiologist;Rebecca Sickles, Jerl Santos, RN, BSN   Supervising physician immediately available to respond to emergencies See telemetry face sheet for immediately available ER MD   Medication changes reported     No   Fall or balance concerns reported    No   Warm-up and Cool-down Performed on first and last piece of equipment   Resistance Training Performed No  Completed extra time on the TM due to late start   VAD Patient? No   Pain Assessment   Currently in Pain? No/denies   Multiple Pain Sites No           Exercise Prescription Changes - 06/12/15 1000    Exercise Review   Progression Yes   Response to Exercise   Blood Pressure (Admit) --   Blood Pressure (Exercise) --   Blood Pressure (Exit) --   Heart Rate (Admit) --   Heart Rate (Exercise) --   Heart Rate (Exit) --   Rating of Perceived Exertion (Exercise) --   Symptoms None   Comments Patient is approaching graduation of the program and home exercise plans were discussed. Details of the patient's exercise prescription and what they need to do in order to continue the prescription and progress with exercise were outlined and the patient verbalized understanding. The patient plans to complete all exercise at [facility] with the [name] program, he is also an avid runner and runs at home in his neighborhood. He will be running a half marathon at the end of April.    Frequency Add 3 additional days to program exercise sessions.   Duration Progress to 50 minutes of aerobic without signs/symptoms of physical distress   Intensity Other (comment)  40-85% HRR 107-148bpm   Progression    Progression Continue progressive overload as per policy without signs/symptoms or physical distress.   Interval Training   Interval Training Yes   Equipment Treadmill   Comments 3.4/0 - 5.7/1%   Home Exercise Plan   Plans to continue exercise at [facility] (comment)  [name]      Goals Met:  Independence with exercise equipment Exercise tolerated well Personal goals reviewed No report of cardiac concerns or symptoms  Goals Unmet:  Not Applicable  Comments: Patient completed exercise prescription and all exercise goals during rehab session. The exercise was tolerated well and the patient is progressing in the program.    Dr. Emily Filbert is Medical Director for [facility].

## 2015-06-14 DIAGNOSIS — Z9861 Coronary angioplasty status: Secondary | ICD-10-CM

## 2015-06-14 NOTE — Progress Notes (Signed)
Cardiac Individual Treatment Plan  Patient Details  Name: Hector Woodard MRN: [id number] Date of Birth: 1945/08/17 Referring Provider:  Yolonda Kida, MD  Initial Encounter Date:       Cardiac Rehab from 03/12/2015 in [facility] Cardiac and Pulmonary Rehab   Date  03/12/15      Visit Diagnosis: S/P PTCA (percutaneous transluminal coronary angioplasty)  Patient's Home Medications on Admission:  Current outpatient prescriptions:  .  amoxicillin-clavulanate (AUGMENTIN) 875-125 MG tablet, Take 1 tablet by mouth 2 (two) times daily., Disp: 20 tablet, Rfl: 0 .  aspirin EC 81 MG tablet, Take 81 mg by mouth daily., Disp: , Rfl:  .  atorvastatin (LIPITOR) 40 MG tablet, 40 mg at bedtime., Disp: , Rfl:  .  clopidogrel (PLAVIX) 75 MG tablet, Take 1 tablet (75 mg total) by mouth daily., Disp: 30 tablet, Rfl: 11 .  metoprolol succinate (TOPROL-XL) 25 MG 24 hr tablet, Take 1 tablet (25 mg total) by mouth daily. (Patient taking differently: Take 12.5 mg by mouth daily. ), Disp: 30 tablet, Rfl: 11 .  Multiple Vitamin (MULTIVITAMIN) capsule, Take 1 capsule by mouth daily., Disp: , Rfl:  .  Omega-3 Fatty Acids (FISH OIL) 1000 MG CAPS, Take 1 capsule by mouth at bedtime., Disp: , Rfl:   Past Medical History: Past Medical History  Diagnosis Date  . Hypercholesteremia   . Vertigo 2015  . PIN (prostatic intraepithelial neoplasia)   . Anginal pain ([place])   . Hypertension     Tobacco Use: History  Smoking status  . Never Smoker   Smokeless tobacco  . Never Used    Labs: Recent Review Flowsheet Data    Labs for ITP Cardiac and Pulmonary Rehab Latest Ref Rng 02/12/2015 04/24/2015 04/24/2015   Cholestrol - 180 151 CANCELED   LDLCALC 0 - 99 mg/dL 92 - -   HDL - 70 63 CANCELED   Trlycerides - 91 74 CANCELED       Exercise Target Goals:    Exercise Program Goal: Individual exercise prescription set with THRR, safety & activity barriers. Participant demonstrates ability to understand and  report RPE using BORG scale, to self-measure pulse accurately, and to acknowledge the importance of the exercise prescription.  Exercise Prescription Goal: Starting with aerobic activity 30 plus minutes a day, 3 days per week for initial exercise prescription. Provide home exercise prescription and guidelines that participant acknowledges understanding prior to discharge.  Activity Barriers & Risk Stratification:     Activity Barriers & Cardiac Risk Stratification - 03/12/15 1522    Activity Barriers & Cardiac Risk Stratification   Cardiac Risk Stratification --  3 vessel disease >60% all three       6 Minute Walk:     6 Minute Walk      03/12/15 1421 06/12/15 1018     6 Minute Walk   Phase Initial Discharge    Distance 1642 feet 2115 feet    Distance % Change  29 %    Walk Time 6 minutes 6 minutes    # of Rest Breaks  0    RPE 7 12    Symptoms No No    Resting HR 68 bpm 74 bpm    Resting BP 124/72 mmHg 130/78 mmHg    Max Ex. HR 98 bpm 129 bpm    Max Ex. BP 140/60 mmHg 172/64 mmHg       Initial Exercise Prescription:     Initial Exercise Prescription - 03/12/15 1400    Date  of Initial Exercise Prescription   Date 03/12/15   Treadmill   MPH 3.1   Grade 0   Minutes 15   Bike   Level 1.5   Minutes 15   Recumbant Bike   Level 3   RPM 50   Minutes 15   NuStep   Level 4   Watts 60   Minutes 15   Arm Ergometer   Level 2   Watts 10   Minutes 15   Arm/Foot Ergometer   Level 2   Watts 15   Minutes 15   Cybex   Level 3   RPM 50   Minutes 15   Recumbant Elliptical   Level 2   Watts 30   Minutes 15   Elliptical   Level 1   Speed 3   Minutes 15   REL-XR   Level 4   Watts 60   Minutes 15   T5 Nustep   Level 3   Watts 30   Minutes 15   Biostep-RELP   Level 4   Watts 60   Minutes 15   Prescription Details   Frequency (times per week) 3   Duration Progress to 30 minutes of continuous aerobic without signs/symptoms of physical distress    Intensity   THRR REST +  30   Ratings of Perceived Exertion 11-15   Progression   Progression Continue progressive overload as per policy without signs/symptoms or physical distress.   Resistance Training   Training Prescription Yes   Weight 3   Reps 10-15      Perform Capillary Blood Glucose checks as needed.  Exercise Prescription Changes:     Exercise Prescription Changes      03/12/15 1500 03/20/15 0900 03/29/15 1400 04/03/15 1000 04/17/15 0700   Exercise Review   Progression    Yes    Response to Exercise   Blood Pressure (Admit) 124/72 mmHg  116/64 mmHg  126/70 mmHg   Blood Pressure (Exercise) 140/60 mmHg  148/78 mmHg  142/78 mmHg   Blood Pressure (Exit) 114/66 mmHg  110/62 mmHg  120/74 mmHg   Heart Rate (Admit) 68 bpm  73 bpm  77 bpm   Heart Rate (Exercise) 98 bpm  109 bpm  121 bpm   Heart Rate (Exit) 66 bpm  78 bpm  74 bpm   Rating of Perceived Exertion (Exercise) _0 Symptoms none reported during walk  none none none   Comments  First day of exercise! Patient was oriented to equipment and exercise prescription was discussed with patient. Patient was able to complete exercise in class today with no signs or symptoms.   A new target heart rate range for exercise was calculated based on the patient's ability to exercise with increased intensity. The range is 40-85% of HRR and encompasses moderate-vigorous exercise. The target heart rate range is equivalent to an RPE of 12-17. Met with Hector Woodard about changing his THR to 40-85% of HRR.He has been instructed to use this THR when he exercises at home.      Duration   Progress to 50 minutes of aerobic without signs/symptoms of physical distress Progress to 50 minutes of aerobic without signs/symptoms of physical distress Progress to 50 minutes of aerobic without signs/symptoms of physical distress   Intensity   THRR unchanged Other (comment)  40-85% HRR 107-148bpm Other (comment)  40-85% HRR 107-148bpm   Progression    Progression   Continue progressive overload as per policy without  signs/symptoms or physical distress. Continue progressive overload as per policy without signs/symptoms or physical distress. Continue progressive overload as per policy without signs/symptoms or physical distress.   Resistance Training   Training Prescription (read-only)   Yes Yes Yes   Weight (read-only)   _0 Reps (read-only)   10-15 10-15 10-15   Interval Training   Interval Training   No No No   Treadmill   MPH (read-only)   3.5 3.5 3.5   Grade (read-only)   _1 Minutes (read-only)   _2 NuStep   Level (read-only)   _3 Watts (read-only)   40 40 40   Minutes (read-only)   _4 05/10/15 0733 06/12/15 1000         Exercise Review   Progression Yes Yes      Response to Exercise   Blood Pressure (Admit) 142/72 mmHg --      Blood Pressure (Exercise) 208/72 mmHg --      Blood Pressure (Exit) 120/80 mmHg --      Heart Rate (Admit) 84 bpm --      Heart Rate (Exercise) 140 bpm --      Heart Rate (Exit) 83 bpm --      Rating of Perceived Exertion (Exercise) 14 --      Symptoms None None      Comments Hector Woodard is eager to continue progression. We added the cybex bike to his routine in order to provide more challenge. He continues to maintain TM intervals and jogs regularly at home.  Patient is approaching graduation of the program and home exercise plans were discussed. Details of the patient's exercise prescription and what they need to do in order to continue the prescription and progress with exercise were outlined and the patient verbalized understanding. The patient plans to complete all exercise at [facility] with the [name] program, he is also an avid runner and runs at home in his neighborhood. He will be running a half marathon at the end of April.       Frequency Add 3 additional days to program exercise sessions. Add 3 additional days to program exercise sessions.      Duration  Progress to 50 minutes of aerobic without signs/symptoms of physical distress Progress to 50 minutes of aerobic without signs/symptoms of physical distress      Intensity Other (comment)  40-85% HRR 107-148bpm Other (comment)  40-85% HRR 107-148bpm      Progression   Progression Continue progressive overload as per policy without signs/symptoms or physical distress. Continue progressive overload as per policy without signs/symptoms or physical distress.      Resistance Training   Training Prescription (read-only) Yes       Weight (read-only) 10       Reps (read-only) 10-15       Interval Training   Interval Training Yes Yes      Equipment Treadmill Treadmill      Comments 3.4/0 - 5.7/1% 3.4/0 - 5.7/1%      Treadmill   MPH (read-only) 5.7       Grade (read-only) 1       Minutes (read-only) 20       NuStep   Level (read-only) 6       Watts (read-only) 40       Minutes (read-only) [place]  Plans to continue exercise at  [facility] (comment)  Genevie Cheshire         Exercise Comments:   Discharge Exercise Prescription (Final Exercise Prescription Changes):     Exercise Prescription Changes - 06/12/15 1000    Exercise Review   Progression Yes   Response to Exercise   Blood Pressure (Admit) --   Blood Pressure (Exercise) --   Blood Pressure (Exit) --   Heart Rate (Admit) --   Heart Rate (Exercise) --   Heart Rate (Exit) --   Rating of Perceived Exertion (Exercise) --   Symptoms None   Comments Patient is approaching graduation of the program and home exercise plans were discussed. Details of the patient's exercise prescription and what they need to do in order to continue the prescription and progress with exercise were outlined and the patient verbalized understanding. The patient plans to complete all exercise at [facility] with the [name] program, he is also an avid runner and runs at home in his neighborhood. He will be running a half  marathon at the end of April.    Frequency Add 3 additional days to program exercise sessions.   Duration Progress to 50 minutes of aerobic without signs/symptoms of physical distress   Intensity Other (comment)  40-85% HRR 107-148bpm   Progression   Progression Continue progressive overload as per policy without signs/symptoms or physical distress.   Interval Training   Interval Training Yes   Equipment Treadmill   Comments 3.4/0 - 5.7/1%   Home Exercise Plan   Plans to continue exercise at [facility] (comment)  [name]      Nutrition:  Target Goals: Understanding of nutrition guidelines, daily intake of sodium <155m, cholesterol <2048m calories 30% from fat and 7% or less from saturated fats, daily to have 5 or more servings of fruits and vegetables.  Biometrics:     Pre Biometrics - 03/12/15 1424    Pre Biometrics   Height 6' (1.829 m)   Weight 196 lb (88.905 kg)   Waist Circumference 36 inches   Hip Circumference 42 inches   Waist to Hip Ratio 0.86 %   BMI (Calculated) 26.6         Post Biometrics - 06/12/15 1018     Post  Biometrics   Height 6' (1.829 m)   Weight 188 lb 6.4 oz (85.458 kg)   Waist Circumference 33 inches   Hip Circumference 40 inches   Waist to Hip Ratio 0.82 %   BMI (Calculated) 25.6      Nutrition Therapy Plan and Nutrition Goals:     Nutrition Therapy & Goals - 04/03/15 1142    Nutrition Therapy   Diet 2000kcal DASH diet, 150067modium (2000m33m less)   Protein (oz) (read-only) 12 ounces/day  ounces of protein foods   Fiber 30 grams   Whole Grain Foods 3 servings   Saturated Fats 15 max. grams  maximum grams   Fruits and Vegetables 5 servings/day  or more servings   Personal Nutrition Goals   Personal Goal #1 control food portions at home and especially at restaurants   Personal Goal #2 Keep controlling sugar intake (less than or = 50g daily, or 10% or calories)   Personal Goal #3 Increase vegetables and fruits,  plan to have 1 or more with every meal.    Comments patient seems to have good understanding of nutrition concepts, able to state his own goals.      Nutrition Discharge:  Rate Your Plate Scores:     Nutrition Assessments - 06/14/15 0936    Rate Your Plate Scores   Post Score 82   Post Score % 91.1 %      Nutrition Goals Re-Evaluation:     Nutrition Goals Re-Evaluation      05/10/15 1153 06/07/15 1127         Personal Goal #1 Re-Evaluation   Goal Progress Seen Yes Yes      Personal Goal #2 Re-Evaluation   Personal Goal #2 Mitzi Hansen met with the dietician and he said he and his wife have made some changes including atting blueberries to his oatmeal with almonds in the am. He said they have been reading labels better to decrease thieir sugar intake and rarely eat pizza now.        Goal Progress Seen Yes Yes      Comments  Hector Woodard reports he and his wife have cut back on their sodium and sugar. "I never realized how much damage sugar can do to one's heart vessels and blood vessels.      Personal Goal #3 Re-Evaluation   Personal Goal #3 Hector Woodard met with the dietician and he said he and his wife have made some changes including atting blueberries to his oatmeal with almonds in the am. He said they have been reading labels better to decrease thieir sugar intake and rarely eat pizza now.        Goal Progress Seen Yes          Psychosocial: Target Goals: Acknowledge presence or absence of depression, maximize coping skills, provide positive support system. Participant is able to verbalize types and ability to use techniques and skills needed for reducing stress and depression.  Initial Review & Psychosocial Screening:     Initial Psych Review & Screening - 03/12/15 [place]? Yes   Comments Family lives close to home   Barriers   Psychosocial barriers to participate in program There are no identifiable barriers or psychosocial needs.;The patient  should benefit from training in stress management and relaxation.   Screening Interventions   Interventions Encouraged to exercise      Quality of Life Scores:     Quality of Life - 06/14/15 0937    Quality of Life Scores   Health/Function Post 0.63 %   Health/Function % Change 16 %   Socioeconomic Post 27.86 %   Socioeconomic % Change 1 %   Psych/Spiritual Post 28.93 %   Psych/Spiritual % Change 4 %   Family Post 30 %   Family % Change 0 %   GLOBAL Post 28.74 %   GLOBAL % Change 8 %      PHQ-9:     Recent Review Flowsheet Data    Depression screen [facility] 2/9 06/14/2015 03/12/2015 02/12/2015   Decreased Interest 0 0 0   Down, Depressed, Hopeless 0 0 0   PHQ - 2 Score 0 0 0   Altered sleeping 0 0 -   Tired, decreased energy 0 1 -   Change in appetite 0 0 -   Feeling bad or failure about yourself  0 0 -   Trouble concentrating 0 0 -   Moving slowly or fidgety/restless 0 0 -   Suicidal thoughts - 0 -   PHQ-9 Score 0 1 -   Difficult doing work/chores - Not difficult at all -      Psychosocial Evaluation and Intervention:  Psychosocial Evaluation - 04/24/15 0943    Psychosocial Evaluation & Interventions   Interventions Encouraged to exercise with the program and follow exercise prescription   Comments Counselor met with Mr. Mizrahi today for initial psychosocial evaluation.  He is a 70 year old well adjusted individual who had a stent inserted on 02/28/15.  He has a strong support system of 38 years and has adult children who live close by as well as active involvement in his local faith community.  Mr. Oyervides reports sleeping well, having a good appetite and denies current or past symptoms of depression or anxiety.  He is typically positive and states he has minimal stress now that he has retired last year.  His goals for this program are to educate him on the limits of exercise so that he can get back to running regularly and hopefully participate in a half marathon this  April.  He is a member of a gym and is extremely active so he will be able to maintain any progress made in this program with great ease.        Psychosocial Re-Evaluation:     Psychosocial Re-Evaluation      05/10/15 1158 06/07/15 1126         Psychosocial Re-Evaluation   Interventions Encouraged to attend Cardiac Rehabilitation for the exercise       Comments Hector Woodard said he feels that Cardiac Rehab has helped him. 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 He said he hopes to encourage people to attend Cardiac Rehab since it has helped him. Hector Woodard repeated again Cardiac REhab has been helpful and he feels he can do alot since exercsing on the upright bike esp.          Vocational Rehabilitation: Provide vocational rehab assistance to qualifying candidates.   Vocational Rehab Evaluation & Intervention:     Vocational Rehab - 03/12/15 1323    Initial Vocational Rehab Evaluation & Intervention   Assessment shows need for Vocational Rehabilitation No      Education: Education Goals: Education classes will be provided on a weekly basis, covering required topics. Participant will state understanding/return demonstration of topics presented.  Learning Barriers/Preferences:     Learning Barriers/Preferences - 03/12/15 1322    Learning Barriers/Preferences   Learning Barriers None   Learning Preferences None      Education Topics: General Nutrition Guidelines/Fats and Fiber: -Group instruction provided by verbal, written material, models and posters to present the general guidelines for heart healthy nutrition. Gives an explanation and review of dietary fats and fiber.          Cardiac Rehab from 06/05/2015 in [facility] Cardiac and Pulmonary Rehab   Date  05/08/15   Educator  CR   Instruction Review Code  2- meets goals/outcomes      Controlling Sodium/Reading Food Labels: -Group verbal and written material supporting the  discussion of sodium use in heart healthy nutrition. Review and explanation with models, verbal and written materials for utilization of the food label.   Exercise Physiology & Risk Factors: - Group verbal and written instruction with models to review the exercise physiology of the cardiovascular system and associated critical values. Details cardiovascular disease risk factors and the goals associated with each risk factor.      Cardiac Rehab from 06/05/2015 in [facility] Cardiac and Pulmonary Rehab   Date  05/29/15   Educator  BS   Instruction Review Code  2- meets goals/outcomes  Aerobic Exercise & Resistance Training: - Gives group verbal and written discussion on the health impact of inactivity. On the components of aerobic and resistive training programs and the benefits of this training and how to safely progress through these programs.      Cardiac Rehab from 06/05/2015 in [facility] Cardiac and Pulmonary Rehab   Date  04/17/15   Educator  RM   Instruction Review Code  2- meets goals/outcomes      Flexibility, Balance, General Exercise Guidelines: - Provides group verbal and written instruction on the benefits of flexibility and balance training programs. Provides general exercise guidelines with specific guidelines to those with heart or lung disease. Demonstration and skill practice provided.      Cardiac Rehab from 06/05/2015 in [facility] Cardiac and Pulmonary Rehab   Date  06/05/15   Educator  RS   Instruction Review Code  2- meets goals/outcomes      Stress Management: - Provides group verbal and written instruction about the health risks of elevated stress, cause of high stress, and healthy ways to reduce stress.      Cardiac Rehab from 06/05/2015 in [facility] Cardiac and Pulmonary Rehab   Date  04/12/15   Educator  CE   Instruction Review Code  2- meets goals/outcomes      Depression: - Provides group verbal and written instruction on the correlation between heart/lung disease and  depressed mood, treatment options, and the stigmas associated with seeking treatment.      Cardiac Rehab from 06/05/2015 in [facility] Cardiac and Pulmonary Rehab   Date  05/24/15   Educator  CE   Instruction Review Code  2- meets goals/outcomes      Anatomy & Physiology of the Heart: - Group verbal and written instruction and models provide basic cardiac anatomy and physiology, with the coronary electrical and arterial systems. Review of: AMI, Angina, Valve disease, Heart Failure, Cardiac Arrhythmia, Pacemakers, and the ICD.      Cardiac Rehab from 06/05/2015 in [facility] Cardiac and Pulmonary Rehab   Date  04/19/15   Educator  CE   Instruction Review Code  2- meets goals/outcomes      Cardiac Procedures: - Group verbal and written instruction and models to describe the testing methods done to diagnose heart disease. Reviews the outcomes of the test results. Describes the treatment choices: Medical Management, Angioplasty, or Coronary Bypass Surgery.      Cardiac Rehab from 06/05/2015 in [facility] Cardiac and Pulmonary Rehab   Date  03/20/15   Educator  DW   Instruction Review Code  2- meets goals/outcomes      Cardiac Medications: - Group verbal and written instruction to review commonly prescribed medications for heart disease. Reviews the medication, class of the drug, and side effects. Includes the steps to properly store meds and maintain the prescription regimen.      Cardiac Rehab from 06/05/2015 in [facility] Cardiac and Pulmonary Rehab   Date  03/29/15   Educator  CE   Instruction Review Code  2- meets goals/outcomes      Go Sex-Intimacy & Heart Disease, Get SMART - Goal Setting: - Group verbal and written instruction through game format to discuss heart disease and the return to sexual intimacy. Provides group verbal and written material to discuss and apply goal setting through the application of the S.M.A.R.T. Method.      Cardiac Rehab from 06/05/2015 in [facility] Cardiac and Pulmonary Rehab   Date   03/20/15   Educator  DW  Instruction Review Code  2- meets goals/outcomes      Other Matters of the Heart: - Provides group verbal, written materials and models to describe Heart Failure, Angina, Valve Disease, and Diabetes in the realm of heart disease. Includes description of the disease process and treatment options available to the cardiac patient.      Cardiac Rehab from 06/05/2015 in [facility] Cardiac and Pulmonary Rehab   Date  05/01/15   Educator  DW   Instruction Review Code  2- meets goals/outcomes      Exercise & Equipment Safety: - Individual verbal instruction and demonstration of equipment use and safety with use of the equipment.      Cardiac Rehab from 06/05/2015 in [facility] Cardiac and Pulmonary Rehab   Date  03/12/15   Educator  SB   Instruction Review Code  2- meets goals/outcomes      Infection Prevention: - Provides verbal and written material to individual with discussion of infection control including proper hand washing and proper equipment cleaning during exercise session.      Cardiac Rehab from 06/05/2015 in [facility] Cardiac and Pulmonary Rehab   Date  03/12/15   Educator  Sb   Instruction Review Code  2- meets goals/outcomes      Falls Prevention: - Provides verbal and written material to individual with discussion of falls prevention and safety.      Cardiac Rehab from 06/05/2015 in [facility] Cardiac and Pulmonary Rehab   Date  03/12/15   Educator  SB   Instruction Review Code  2- meets goals/outcomes      Diabetes: - Individual verbal and written instruction to review signs/symptoms of diabetes, desired ranges of glucose level fasting, after meals and with exercise. Advice that pre and post exercise glucose checks will be done for 3 sessions at entry of program.    Knowledge Questionnaire Score:     Knowledge Questionnaire Score - 06/14/15 0936    Knowledge Questionnaire Score   Post Score 26      Personal Goals and Risk Factors at Admission:      Personal Goals and Risk Factors at Admission - 03/29/15 1151    Core Components/Risk Factors/Patient Goals on Admission    Weight Management Yes   Intervention (read-only) Learn and follow the exercise and diet guidelines while in the program. Utilize the nutrition and education classes to help gain knowledge of the diet and exercise expectations in the program      Personal Goals and Risk Factors Review:      Goals and Risk Factor Review      03/28/15 0856 03/29/15 1151 03/29/15 1152 04/03/15 1019 05/10/15 1154   Core Components/Risk Factors/Patient Goals Review   Personal Goals Review Increase Aerobic Exercise and Physical Activity Weight Management Hypertension;Lipids     Weight Management (read-only)   Goals Progress/Improvement seen  Yes Yes  Yes   Comments   Hector Woodard wants to lose a couple of lbs so it will be easier to run a half marathon again.   Hector Woodard has lost weight but he wants to lose 5-6 lbs more. He is almost at goal weight but since he runs half marathons or his goal is to run another half marathon he wants to lose weight.    Increase Aerobic Exercise and Physical Activity (read-only)   Goals Progress/Improvement seen  Yes   Yes Yes   Comments Hector Woodard has been progressing very well and very quickly due to his history of being physically active. He was a  runner before his heart procedure and wants to get back to that. He can continuously exercise for the entire class time and in order to continue to challenge him we have increaed his TM speed and NS workload. He went for a walk/jog over Christmas weekend with his wife and averaged an 11.5 min/mile over 2 miles. He is going to continue accumulating exercise minutes at home outside of class.    Hector Woodard did a 57mle walk/jog at home over the weekend and felt really good. Today in class, he ran for 8 continuous minutes on the treadmill for the first time in a year. He is very excited about his progress.  Hector Woodard the upright bike on  level 8 since he wanted to challenge himself and max his heart rate. On level 8 his heart rate was 110. When he went to level 10 his heart rate went higher to 126 and his blood pressure really increased 208/72 . No c/o and he said he really felt a workout with the upright bike.   Hypertension (read-only)   Goal   Participant will see blood pressure controlled within the values of 140/927mHg or within value directed by their physician.     Progress seen toward goals   Yes  Yes   Comments   Blood pressure is good at 120/70 on his blood pressure medicine.   blood pressure 128-142/72 upon arrival to Cardiac Rehab.    Abnormal Lipids (read-only)   Progress seen towards goals   Unknown  Unknown   Comments   Will have follow up blood work with his MD.   Hector Woodard his MD follows up on his lipid panel blood work.      06/07/15 1125           Core Components/Risk Factors/Patient Goals Review   Personal Goals Review Hypertension;Lipids       Review Hector Woodard blood pressure is under control with his medicine and his cholestrol is checke by his MD.       Expected Outcomes Short term goal to keep blood pressure sys below 140. Longer term goal expected by graduation to keep blood pressure below 138/70.          Personal Goals Discharge (Final Personal Goals and Risk Factors Review):      Goals and Risk Factor Review - 06/07/15 1125    Core Components/Risk Factors/Patient Goals Review   Personal Goals Review Hypertension;Lipids   Review Hector Woodard blood pressure is under control with his medicine and his cholestrol is checke by his MD.   Expected Outcomes Short term goal to keep blood pressure sys below 140. Longer term goal expected by graduation to keep blood pressure below 138/70.      ITP Comments:     ITP Comments      03/12/15 1511 04/01/15 1335 04/26/15 1314 05/10/15 1151 05/22/15 0903   ITP Comments Initial ITP Ready for 30 day review.  Continue with ITP Ready for 30 day review.  Continue with ITP. Hector Woodard his MD started him on Augmentin for a sinus infection since he has had a runny nose/cold congestion for a while. AnMaddonet with the dietician and he said he and his wife have made some changes including atting blueberries to his oatmeal with almonds in the am. He said they have been reading labels better to decrease thieir sugar intake and rarely eat pizza now.  30 Day Review.  Continue with ITP.     06/07/15 1124  ITP Comments Hector Woodard will use his Silver Sneakers and exercise at [employer] when he is done with his 36 sessions of Cardiac REhab.           Comments: Hector Woodard was discharged today. He has already gone through the [employer] program. 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 He was given some brochures and explained the Medicare etc insurance a little bit.

## 2015-06-14 NOTE — Progress Notes (Signed)
Discharge Summary  Patient Details  Name: Hector Woodard MRN: [id number] Date of Birth: 1945-10-23 Referring Provider:  Yolonda Kida, MD   Number of Visits: 3  Reason for Discharge:  Patient reached a stable level of exercise. Patient independent in their exercise.  Smoking History:  History  Smoking status  . Never Smoker   Smokeless tobacco  . Never Used    Diagnosis:  S/P PTCA (percutaneous transluminal coronary angioplasty) - Plan: CARDIAC REHAB 30 DAY REVIEW  ADL UCSD:   Initial Exercise Prescription:     Initial Exercise Prescription - 03/12/15 1400    Date of Initial Exercise Prescription   Date 03/12/15   Treadmill   MPH 3.1   Grade 0   Minutes 15   Bike   Level 1.5   Minutes 15   Recumbant Bike   Level 3   RPM 50   Minutes 15   NuStep   Level 4   Watts 60   Minutes 15   Arm Ergometer   Level 2   Watts 10   Minutes 15   Arm/Foot Ergometer   Level 2   Watts 15   Minutes 15   Cybex   Level 3   RPM 50   Minutes 15   Recumbant Elliptical   Level 2   Watts 30   Minutes 15   Elliptical   Level 1   Speed 3   Minutes 15   REL-XR   Level 4   Watts 60   Minutes 15   T5 Nustep   Level 3   Watts 30   Minutes 15   Biostep-RELP   Level 4   Watts 60   Minutes 15   Prescription Details   Frequency (times per week) 3   Duration Progress to 30 minutes of continuous aerobic without signs/symptoms of physical distress   Intensity   THRR REST +  30   Ratings of Perceived Exertion 11-15   Progression   Progression Continue progressive overload as per policy without signs/symptoms or physical distress.   Resistance Training   Training Prescription Yes   Weight 3   Reps 10-15      Discharge Exercise Prescription (Final Exercise Prescription Changes):     Exercise Prescription Changes - 06/12/15 1000    Exercise Review   Progression Yes   Response to Exercise   Blood Pressure (Admit) --   Blood Pressure (Exercise) --    Blood Pressure (Exit) --   Heart Rate (Admit) --   Heart Rate (Exercise) --   Heart Rate (Exit) --   Rating of Perceived Exertion (Exercise) --   Symptoms None   Comments Patient is approaching graduation of the program and home exercise plans were discussed. Details of the patient's exercise prescription and what they need to do in order to continue the prescription and progress with exercise were outlined and the patient verbalized understanding. The patient plans to complete all exercise at [facility] with the [facility] program, he is also an avid runner and runs at home in his neighborhood. He will be running a half marathon at the end of April.    Frequency Add 3 additional days to program exercise sessions.   Duration Progress to 50 minutes of aerobic without signs/symptoms of physical distress   Intensity Other (comment)  40-85% HRR 107-148bpm   Progression   Progression Continue progressive overload as per policy without signs/symptoms or physical distress.   Interval Training  Interval Training Yes   Equipment Treadmill   Comments 3.4/0 - 5.7/1%   Home Exercise Plan   Plans to continue exercise at [facility] (comment)  [facility]ector [facility]      Functional Capacity:     6 Minute Walk      03/12/15 1421 06/12/15 1018     6 Minute Walk   Phase Initial Discharge    Distance 1642 feet 2115 feet    Distance % Change  29 %    Walk Time 6 minutes 6 minutes    # of Rest Breaks  0    RPE 7 12    Symptoms No No    Resting HR 68 bpm 74 bpm    Resting BP 124/72 mmHg 130/78 mmHg    Max Ex. HR 98 bpm 129 bpm    Max Ex. BP 140/60 mmHg 172/64 mmHg       Psychological, QOL, Others - Outcomes: PHQ 2/9: Depression screen Hector [facility]ard 2/9 06/14/2015 03/12/2015 02/12/2015  Decreased Interest 0 0 0  Down, Depressed, Hopeless 0 0 0  PHQ - 2 Score 0 0 0  Altered sleeping 0 0 -  Tired, decreased energy 0 1 -  Change in appetite 0 0 -  Feeling bad or failure about yourself  0 0 -   Trouble concentrating 0 0 -  Moving slowly or fidgety/restless 0 0 -  Suicidal thoughts - 0 -  PHQ-9 Score 0 1 -  Difficult doing work/chores - Not difficult at all -    Quality of Life:     Quality of Life - 06/14/15 0937    Quality of Life Scores   Health/Function Post 0.63 %   Health/Function % Change 16 %   Socioeconomic Post 27.86 %   Socioeconomic % Change 1 %   Psych/Spiritual Post 28.93 %   Psych/Spiritual % Change 4 %   Family Post 30 %   Family % Change 0 %   GLOBAL Post 28.74 %   GLOBAL % Change 8 %      Personal Goals: Goals established at orientation with interventions provided to work toward goal.     Personal Goals and Risk Factors at Admission - 03/29/15 1151    Core Components/Risk Factors/Patient Goals on Admission    Weight Management Yes   Intervention (read-only) Learn and follow the exercise and diet guidelines while in the program. Utilize the nutrition and education classes to help gain knowledge of the diet and exercise expectations in the program       Personal Goals Discharge:     Goals and Risk Factor Review      03/28/15 0856 03/29/15 1151 03/29/15 1152 04/03/15 1019 05/10/15 1154   Core Components/Risk Factors/Patient Goals Review   Personal Goals Review Increase Aerobic Exercise and Physical Activity Weight Management Hypertension;Lipids     Weight Management (read-only)   Goals Progress/Improvement seen  Yes Yes  Yes   Comments   Hector Woodard wants to lose a couple of lbs so it will be easier to run a half marathon again.   Hector Woodard has lost weight but he wants to lose 5-6 lbs more. He is almost at goal weight but since he runs half marathons or his goal is to run another half marathon he wants to lose weight.    Increase Aerobic Exercise and Physical Activity (read-only)   Goals Progress/Improvement seen  Yes   Yes Yes   Comments Hector Woodard has been progressing very well and very quickly due to  his history of being physically active. He was a  runner before his heart procedure and wants to get back to that. He can continuously exercise for the entire class time and in order to continue to challenge him we have increaed his TM speed and NS workload. He went for a walk/jog over Christmas weekend with his wife and averaged an 11.5 min/mile over 2 miles. He is going to continue accumulating exercise minutes at home outside of class.    Jonni Sanger did a 53mle walk/jog at home over the weekend and felt really good. Today in class, he ran for 8 continuous minutes on the treadmill for the first time in a year. He is very excited about his progress.  ACamdentried the upright bike on level 8 since he wanted to challenge himself and max his heart rate. On level 8 his heart rate was 110. When he went to level 10 his heart rate went higher to 126 and his blood pressure really increased 208/72 . No c/o and he said he really felt a workout with the upright bike.   Hypertension (read-only)   Goal   Participant will see blood pressure controlled within the values of 140/967mHg or within value directed by their physician.     Progress seen toward goals   Yes  Yes   Comments   Blood pressure is good at 120/70 on his blood pressure medicine.   blood pressure 128-142/72 upon arrival to Cardiac Rehab.    Abnormal Lipids (read-only)   Progress seen towards goals   Unknown  Unknown   Comments   Will have follow up blood work with his MD.   AnAdraineports his MD follows up on his lipid panel blood work.      06/07/15 1125           Core Components/Risk Factors/Patient Goals Review   Personal Goals Review Hypertension;Lipids       Review AnRandies blood pressure is under control with his medicine and his cholestrol is checke by his MD.       Expected Outcomes Short term goal to keep blood pressure sys below 140. Longer term goal expected by graduation to keep blood pressure below 138/70.          Nutrition & Weight - Outcomes:     Pre Biometrics - 03/12/15 1424     Pre Biometrics   Height 6' (1.829 m)   Weight 196 lb (88.905 kg)   Waist Circumference 36 inches   Hip Circumference 42 inches   Waist to Hip Ratio 0.86 %   BMI (Calculated) 26.6         Post Biometrics - 06/12/15 1018     Post  Biometrics   Height 6' (1.829 m)   Weight 188 lb 6.4 oz (85.458 kg)   Waist Circumference 33 inches   Hip Circumference 40 inches   Waist to Hip Ratio 0.82 %   BMI (Calculated) 25.6      Nutrition:     Nutrition Therapy & Goals - 04/03/15 1142    Nutrition Therapy   Diet 2000kcal DASH diet, 150034modium (2000m78m less)   Protein (oz) (read-only) 12 ounces/day  ounces of protein foods   Fiber 30 grams   Whole Grain Foods 3 servings   Saturated Fats 15 max. grams  maximum grams   Fruits and Vegetables 5 servings/day  or more servings   Personal Nutrition Goals   Personal Goal #1 control food portions at home  and especially at restaurants   Personal Goal #2 Keep controlling sugar intake (less than or = 50g daily, or 10% or calories)   Personal Goal #3 Increase vegetables and fruits, plan to have 1 or more with every meal.    Comments patient seems to have good understanding of nutrition concepts, able to state his own goals.      Nutrition Discharge:     Nutrition Assessments - 06/14/15 0936    Rate Your Plate Scores   Post Score 82   Post Score % 91.1 %      Education Questionnaire Score:     Knowledge Questionnaire Score - 06/14/15 0936    Knowledge Questionnaire Score   Post Score 26      Goals reviewed with patient; copy given to patient.

## 2015-06-14 NOTE — Addendum Note (Signed)
Addended by: Gerlene Burdock on: 06/14/2015 02:43 PM   Modules accepted: Orders

## 2015-06-14 NOTE — Patient Instructions (Signed)
Discharge Instructions  Patient Details  Name: Hector Woodard MRN: [id number] Date of Birth: [birth year] Referring Provider:  Yolonda Kida, MD   Number of Visits: 85  Reason for Discharge:  Patient reached a stable level of exercise. Patient independent in their exercise.  Smoking History:  History  Smoking status  . Never Smoker   Smokeless tobacco  . Never Used    Diagnosis:  S/P PTCA (percutaneous transluminal coronary angioplasty)  Initial Exercise Prescription:     Initial Exercise Prescription - 03/12/15 1400    Date of Initial Exercise Prescription   Date 03/12/15   Treadmill   MPH 3.1   Grade 0   Minutes 15   Bike   Level 1.5   Minutes 15   Recumbant Bike   Level 3   RPM 50   Minutes 15   NuStep   Level 4   Watts 60   Minutes 15   Arm Ergometer   Level 2   Watts 10   Minutes 15   Arm/Foot Ergometer   Level 2   Watts 15   Minutes 15   Cybex   Level 3   RPM 50   Minutes 15   Recumbant Elliptical   Level 2   Watts 30   Minutes 15   Elliptical   Level 1   Speed 3   Minutes 15   REL-XR   Level 4   Watts 60   Minutes 15   T5 Nustep   Level 3   Watts 30   Minutes 15   Biostep-RELP   Level 4   Watts 60   Minutes 15   Prescription Details   Frequency (times per week) 3   Duration Progress to 30 minutes of continuous aerobic without signs/symptoms of physical distress   Intensity   THRR REST +  30   Ratings of Perceived Exertion 11-15   Progression   Progression Continue progressive overload as per policy without signs/symptoms or physical distress.   Resistance Training   Training Prescription Yes   Weight 3   Reps 10-15      Discharge Exercise Prescription (Final Exercise Prescription Changes):     Exercise Prescription Changes - 06/12/15 1000    Exercise Review   Progression Yes   Response to Exercise   Blood Pressure (Admit) --   Blood Pressure (Exercise) --   Blood Pressure (Exit) --   Heart Rate (Admit) --    Heart Rate (Exercise) --   Heart Rate (Exit) --   Rating of Perceived Exertion (Exercise) --   Symptoms None   Comments Patient is approaching graduation of the program and home exercise plans were discussed. Details of the patient's exercise prescription and what they need to do in order to continue the prescription and progress with exercise were outlined and the patient verbalized understanding. The patient plans to complete all exercise at [facility] with the [name] program, he is also an avid runner and runs at home in his neighborhood. He will be running a half marathon at the end of April.    Frequency Add 3 additional days to program exercise sessions.   Duration Progress to 50 minutes of aerobic without signs/symptoms of physical distress   Intensity Other (comment)  40-85% HRR 107-148bpm   Progression   Progression Continue progressive overload as per policy without signs/symptoms or physical distress.   Interval Training   Interval Training Yes   Equipment Treadmill   Comments 3.4/0 -  5.7/1%   Home Exercise Plan   Plans to continue exercise at [facility] (comment)  [name]      Functional Capacity:     6 Minute Walk      03/12/15 1421 06/12/15 1018     6 Minute Walk   Phase Initial Discharge    Distance 1642 feet 2115 feet    Distance % Change  29 %    Walk Time 6 minutes 6 minutes    # of Rest Breaks  0    RPE 7 12    Symptoms No No    Resting HR 68 bpm 74 bpm    Resting BP 124/72 mmHg 130/78 mmHg    Max Ex. HR 98 bpm 129 bpm    Max Ex. BP 140/60 mmHg 172/64 mmHg       Quality of Life:     Quality of Life - 03/12/15 1818    Quality of Life Scores   Health/Function Pre 24.63 %   Socioeconomic Pre 27.5 %   Psych/Spiritual Pre 27.93 %   Family Pre 30 %   GLOBAL Pre 26.69 %      Personal Goals: Goals established at orientation with interventions provided to work toward goal.     Personal Goals and Risk Factors at Admission -  03/29/15 1151    Core Components/Risk Factors/Patient Goals on Admission    Weight Management Yes   Intervention (read-only) Learn and follow the exercise and diet guidelines while in the program. Utilize the nutrition and education classes to help gain knowledge of the diet and exercise expectations in the program       Personal Goals Discharge:     Goals and Risk Factor Review - 06/07/15 1125    Core Components/Risk Factors/Patient Goals Review   Personal Goals Review Hypertension;Lipids   Review Hector Woodard 's blood pressure is under control with his medicine and his cholestrol is checke by his MD.   Expected Outcomes Short term goal to keep blood pressure sys below 140. Longer term goal expected by graduation to keep blood pressure below 138/70.      Nutrition & Weight - Outcomes:     Pre Biometrics - 03/12/15 1424    Pre Biometrics   Height 6' (1.829 m)   Weight 196 lb (88.905 kg)   Waist Circumference 36 inches   Hip Circumference 42 inches   Waist to Hip Ratio 0.86 %   BMI (Calculated) 26.6         Post Biometrics - 06/12/15 1018     Post  Biometrics   Height 6' (1.829 m)   Weight 188 lb 6.4 oz (85.458 kg)   Waist Circumference 33 inches   Hip Circumference 40 inches   Waist to Hip Ratio 0.82 %   BMI (Calculated) 25.6      Nutrition:     Nutrition Therapy & Goals - 04/03/15 1142    Nutrition Therapy   Diet 2000kcal DASH diet, 1500mg  sodium (2000mg  or less)   Protein (oz) (read-only) 12 ounces/day  ounces of protein foods   Fiber 30 grams   Whole Grain Foods 3 servings   Saturated Fats 15 max. grams  maximum grams   Fruits and Vegetables 5 servings/day  or more servings   Personal Nutrition Goals   Personal Goal #1 control food portions at home and especially at restaurants   Personal Goal #2 Keep controlling sugar intake (less than or = 50g daily, or 10% or calories)  Personal Goal #3 Increase vegetables and fruits, plan to have 1 or more with every  meal.    Comments patient seems to have good understanding of nutrition concepts, able to state his own goals.      Nutrition Discharge:     Nutrition Assessments - 03/29/15 0943    Rate Your Plate Scores   Pre Score 73   Pre Score % 81 %      Education Questionnaire Score:     Knowledge Questionnaire Score - 03/12/15 1322    Knowledge Questionnaire Score   Pre Score 22/28      Goals reviewed with patient; copy given to patient.

## 2015-06-14 NOTE — Progress Notes (Signed)
Daily Session Note  Patient Details  Name: Hector Woodard MRN: [id number] Date of Birth: January 24, 1946 Referring Provider:  Guadalupe Maple, MD  Encounter Date: 06/14/2015  Check In:     Session Check In - 06/14/15 0923    Check-In   Location [facility]-Cardiac & Pulmonary Re[facility]   Staff Present Gerlene Burdock, RN, BSN;Nattaly Yebra, BS, ACSM EP-C, Exercise Physiologist;Rebecca Brayton El, PT   Supervising physician immediately available to respond to emergencies See telemetry face sheet for immediately available ER MD   Medication changes reported     No   Fall or balance concerns reported    No   Warm-up and Cool-down Performed on first and last piece of equipment   Resistance Training Performed No   VAD Patient? No   Pain Assessment   Currently in Pain? No/denies         Goals Met:  Proper associated with RPD/PD & O2 Sat Exercise tolerated well No report of cardiac concerns or symptoms Strength training completed today  Goals Unmet:  Not Applicable  Comments:    Dr. Emily Filbert is Medical Director for [place] and [facility].

## 2015-07-02 ENCOUNTER — Other Ambulatory Visit: Payer: Self-pay | Admitting: Family Medicine

## 2015-07-02 ENCOUNTER — Encounter: Payer: Self-pay | Admitting: Family Medicine

## 2015-07-02 DIAGNOSIS — M5440 Lumbago with sciatica, unspecified side: Secondary | ICD-10-CM

## 2015-08-14 ENCOUNTER — Ambulatory Visit (INDEPENDENT_AMBULATORY_CARE_PROVIDER_SITE_OTHER): Payer: 59 | Admitting: Family Medicine

## 2015-08-14 ENCOUNTER — Encounter: Payer: Self-pay | Admitting: Family Medicine

## 2015-08-14 VITALS — BP 121/63 | HR 57 | Temp 98.1°F | Ht 71.0 in | Wt 179.0 lb

## 2015-08-14 DIAGNOSIS — Z0000 Encounter for general adult medical examination without abnormal findings: Secondary | ICD-10-CM | POA: Diagnosis not present

## 2015-08-14 DIAGNOSIS — E7800 Pure hypercholesterolemia, unspecified: Secondary | ICD-10-CM

## 2015-08-14 DIAGNOSIS — I10 Essential (primary) hypertension: Secondary | ICD-10-CM

## 2015-08-14 LAB — LP+ALT+AST PICCOLO, WAIVED
ALT (SGPT) Piccolo, Waived: 46 U/L (ref 10–47)
AST (SGOT) PICCOLO, WAIVED: 58 U/L — AB (ref 11–38)
CHOL/HDL RATIO PICCOLO,WAIVE: 2.3 mg/dL
CHOLESTEROL PICCOLO, WAIVED: 157 mg/dL (ref <200)
HDL CHOL PICCOLO, WAIVED: 69 mg/dL (ref >59)
LDL Chol Calc Piccolo Waived: 75 mg/dL (ref <100)
TRIGLYCERIDES PICCOLO,WAIVED: 65 mg/dL (ref <150)
VLDL Chol Calc Piccolo,Waive: 13 mg/dL (ref <30)

## 2015-08-14 NOTE — Progress Notes (Signed)
BP 121/63 mmHg  Pulse 57  Temp(Src) 98.1 F (36.7 C)  Ht 5\' 11"  (1.803 m)  Wt 179 lb (81.194 kg)  BMI 24.98 kg/m2  SpO2 96%   Subjective:    Patient ID: Hector Woodard, male    DOB: 1945-04-07, 70 y.o.   MRN: [id number]  HPI: Hector Woodard is a 70 y.o. male  Chief Complaint  Patient presents with  . Hyperlipidemia  . Hypertension  Agent recheck blood pressure home monitoring spent good pulse doing better with decrease metoprolol from 50-25 taking medications faithfully without side effects Taking Lipitor without problems stable to do regular running without any myalgias or symptoms taking faithfully. No CAD symptoms no problems with stents.   Relevant past medical, surgical, family and social history reviewed and updated as indicated. Interim medical history since our last visit reviewed. Allergies and medications reviewed and updated.  Review of Systems  Constitutional: Negative.   Respiratory: Negative.   Cardiovascular: Negative.     Per HPI unless specifically indicated above     Objective:    BP 121/63 mmHg  Pulse 57  Temp(Src) 98.1 F (36.7 C)  Ht 5\' 11"  (1.803 m)  Wt 179 lb (81.194 kg)  BMI 24.98 kg/m2  SpO2 96%  Wt Readings from Last 3 Encounters:  08/14/15 179 lb (81.194 kg)  06/12/15 188 lb 6.4 oz (85.458 kg)  05/09/15 192 lb (87.091 kg)    Physical Exam  Constitutional: He is oriented to person, place, and time. He appears well-developed and well-nourished. No distress.  HENT:  Head: Normocephalic and atraumatic.  Right Ear: Hearing normal.  Left Ear: Hearing normal.  Nose: Nose normal.  Eyes: Conjunctivae and lids are normal. Right eye exhibits no discharge. Left eye exhibits no discharge. No scleral icterus.  Cardiovascular: Normal rate, regular rhythm and normal heart sounds.   Pulmonary/Chest: Effort normal and breath sounds normal. No respiratory distress.  Musculoskeletal: Normal range of motion.  Neurological: He is alert and  oriented to person, place, and time.  Skin: Skin is intact. No rash noted.  Psychiatric: He has a normal mood and affect. His speech is normal and behavior is normal. Judgment and thought content normal. Cognition and memory are normal.    Results for orders placed or performed in visit on 04/24/15  Comprehensive metabolic panel  Result Value Ref Range   Glucose 97 65 - 99 mg/dL   BUN 18 8 - 27 mg/dL   Creatinine, Ser 1.31 (H) 0.76 - 1.27 mg/dL   GFR calc non Af Amer 56 (L) >59 mL/min/1.73   GFR calc Af Amer 65 >59 mL/min/1.73   BUN/Creatinine Ratio 14 10 - 22   Sodium 142 134 - 144 mmol/L   Potassium 4.5 3.5 - 5.2 mmol/L   Chloride 104 96 - 106 mmol/L   CO2 24 18 - 29 mmol/L   Calcium 9.7 8.6 - 10.2 mg/dL   Total Protein 6.7 6.0 - 8.5 g/dL   Albumin 4.3 3.6 - 4.8 g/dL   Globulin, Total 2.4 1.5 - 4.5 g/dL   Albumin/Globulin Ratio 1.8 1.1 - 2.5   Bilirubin Total 0.6 0.0 - 1.2 mg/dL   Alkaline Phosphatase 45 39 - 117 IU/L   AST 23 0 - 40 IU/L   ALT 20 0 - 44 IU/L  NMR, lipoprofile  Result Value Ref Range   LDL Particle Number CANCELED nmol/L   LDL-C CANCELED mg/dL   HDL Cholesterol by NMR CANCELED    Triglycerides by NMR  CANCELED    Cholesterol CANCELED    HDL Particle Number CANCELED   NMR, lipoprofile  Result Value Ref Range   LDL Particle Number [id number] <1000 nmol/L   LDL-C 73 0 - 99 mg/dL   HDL Cholesterol by NMR 63 >39 mg/dL   Triglycerides by NMR 74 0 - 149 mg/dL   Cholesterol 151 100 - 199 mg/dL   HDL Particle Number 36.5 >=30.5 umol/L   Small LDL Particle Number 332 <=527 nmol/L   LDL Size 20.8 >20.5 nm   LP-IR Score <25 <=45      Assessment & Plan:   Problem List Items Addressed This Visit      Cardiovascular and Mediastinum   Essential hypertension - Primary    The current medical regimen is effective;  continue present plan and medications.       Relevant Orders   Basic metabolic panel   LP+ALT+AST Piccolo, Waived     Other   Hypercholesteremia     The current medical regimen is effective;  continue present plan and medications.       Relevant Orders   Basic metabolic panel   LP+ALT+AST Piccolo, Waived    Other Visit Diagnoses    Healthcare maintenance        Relevant Orders    Hepatitis C Antibody        Follow up plan: Return in about 6 months (around 02/14/2016) for Physical Exam.

## 2015-08-14 NOTE — Assessment & Plan Note (Signed)
The current medical regimen is effective;  continue present plan and medications.  

## 2015-08-15 ENCOUNTER — Encounter: Payer: Self-pay | Admitting: Family Medicine

## 2015-08-15 LAB — BASIC METABOLIC PANEL
BUN/Creatinine Ratio: 14 (ref 10–24)
BUN: 17 mg/dL (ref 8–27)
CALCIUM: 9.5 mg/dL (ref 8.6–10.2)
CHLORIDE: 102 mmol/L (ref 96–106)
CO2: 26 mmol/L (ref 18–29)
Creatinine, Ser: 1.25 mg/dL (ref 0.76–1.27)
GFR calc Af Amer: 68 mL/min/{1.73_m2} (ref >59)
GFR calc non Af Amer: 59 mL/min/{1.73_m2} — ABNORMAL LOW (ref >59)
GLUCOSE: 92 mg/dL (ref 65–99)
Potassium: 4.3 mmol/L (ref 3.5–5.2)
Sodium: 145 mmol/L — ABNORMAL HIGH (ref 134–144)

## 2015-08-15 LAB — HEPATITIS C ANTIBODY

## 2015-08-20 ENCOUNTER — Encounter: Payer: Self-pay | Admitting: Family Medicine

## 2015-10-01 DIAGNOSIS — R42 Dizziness and giddiness: Secondary | ICD-10-CM | POA: Insufficient documentation

## 2015-10-01 DIAGNOSIS — R9431 Abnormal electrocardiogram [ECG] [EKG]: Secondary | ICD-10-CM | POA: Diagnosis not present

## 2015-10-01 DIAGNOSIS — E782 Mixed hyperlipidemia: Secondary | ICD-10-CM | POA: Diagnosis not present

## 2015-10-01 DIAGNOSIS — I25118 Atherosclerotic heart disease of native coronary artery with other forms of angina pectoris: Secondary | ICD-10-CM | POA: Diagnosis not present

## 2015-10-01 DIAGNOSIS — I10 Essential (primary) hypertension: Secondary | ICD-10-CM | POA: Diagnosis not present

## 2015-10-01 DIAGNOSIS — R0789 Other chest pain: Secondary | ICD-10-CM | POA: Diagnosis not present

## 2015-10-22 DIAGNOSIS — R42 Dizziness and giddiness: Secondary | ICD-10-CM | POA: Diagnosis not present

## 2015-10-22 DIAGNOSIS — R9431 Abnormal electrocardiogram [ECG] [EKG]: Secondary | ICD-10-CM | POA: Diagnosis not present

## 2015-10-22 DIAGNOSIS — I6522 Occlusion and stenosis of left carotid artery: Secondary | ICD-10-CM | POA: Diagnosis not present

## 2015-10-22 DIAGNOSIS — R0789 Other chest pain: Secondary | ICD-10-CM | POA: Diagnosis not present

## 2016-02-07 ENCOUNTER — Other Ambulatory Visit: Payer: Self-pay | Admitting: Family Medicine

## 2016-02-13 ENCOUNTER — Encounter: Payer: Self-pay | Admitting: Family Medicine

## 2016-02-13 ENCOUNTER — Ambulatory Visit (INDEPENDENT_AMBULATORY_CARE_PROVIDER_SITE_OTHER): Payer: Commercial Managed Care - HMO | Admitting: Family Medicine

## 2016-02-13 VITALS — BP 134/62 | HR 54 | Temp 97.5°F | Ht 71.4 in | Wt 183.2 lb

## 2016-02-13 DIAGNOSIS — I10 Essential (primary) hypertension: Secondary | ICD-10-CM

## 2016-02-13 DIAGNOSIS — I2510 Atherosclerotic heart disease of native coronary artery without angina pectoris: Secondary | ICD-10-CM | POA: Diagnosis not present

## 2016-02-13 DIAGNOSIS — E7800 Pure hypercholesterolemia, unspecified: Secondary | ICD-10-CM

## 2016-02-13 DIAGNOSIS — N4231 Prostatic intraepithelial neoplasia: Secondary | ICD-10-CM | POA: Diagnosis not present

## 2016-02-13 DIAGNOSIS — I2583 Coronary atherosclerosis due to lipid rich plaque: Secondary | ICD-10-CM | POA: Diagnosis not present

## 2016-02-13 DIAGNOSIS — Z0000 Encounter for general adult medical examination without abnormal findings: Secondary | ICD-10-CM

## 2016-02-13 LAB — URINALYSIS, ROUTINE W REFLEX MICROSCOPIC
BILIRUBIN UA: NEGATIVE
GLUCOSE, UA: NEGATIVE
LEUKOCYTES UA: NEGATIVE
Nitrite, UA: NEGATIVE
Protein, UA: NEGATIVE
SPEC GRAV UA: 1.02 (ref 1.005–1.030)
Urobilinogen, Ur: 0.2 mg/dL (ref 0.2–1.0)
pH, UA: 5.5 (ref 5.0–7.5)

## 2016-02-13 LAB — MICROSCOPIC EXAMINATION
EPITHELIAL CELLS (NON RENAL): NONE SEEN /HPF (ref 0–10)
WBC, UA: NONE SEEN /hpf (ref 0–?)

## 2016-02-13 MED ORDER — BENAZEPRIL HCL 20 MG PO TABS: 20.0000 mg | ORAL_TABLET | Freq: Every day | ORAL | 4 refills | Status: DC

## 2016-02-13 MED ORDER — CLOPIDOGREL BISULFATE 75 MG PO TABS: 75.0000 mg | ORAL_TABLET | Freq: Every day | ORAL | 4 refills | Status: DC

## 2016-02-13 MED ORDER — ATORVASTATIN CALCIUM 40 MG PO TABS: 40.0000 mg | ORAL_TABLET | Freq: Every day | ORAL | 4 refills | Status: DC

## 2016-02-13 NOTE — Assessment & Plan Note (Signed)
The current medical regimen is effective;  continue present plan and medications.  

## 2016-02-13 NOTE — Addendum Note (Signed)
Addended byGolden Pop on: 02/13/2016 03:56 PM   Modules accepted: Orders

## 2016-02-13 NOTE — Progress Notes (Signed)
BP 134/62 (BP Location: Left Arm)   Pulse (!) 54   Temp 97.5 F (36.4 C)   Ht 5' 11.4" (1.814 m)   Wt 183 lb 3.2 oz (83.1 kg)   SpO2 99%   BMI 25.27 kg/m    Subjective:    Patient ID: Hector Woodard, male    DOB: 1945/04/15, 70 y.o.   MRN: [id number]  HPI: Hector Woodard is a 70 y.o. male  Chief Complaint  Patient presents with  . Medicare Wellness  AWV metrics met  Patient doing well blood pressure good control no issues home blood pressure monitoring showing good reports no side effects from medications. Taking atorvastatin without problems good control. Cardiology reviewed notes doing well no cardiovascular symptoms recovered well. Was able to stop metoprolol has more energy still running and being very active with no symptoms. Taking Plavix with no problems bruising bleeding issues.  Relevant past medical, surgical, family and social history reviewed and updated as indicated. Interim medical history since our last visit reviewed. Allergies and medications reviewed and updated.  Review of Systems  Constitutional: Negative.   HENT: Negative.   Eyes: Negative.   Respiratory: Negative.   Cardiovascular: Negative.   Gastrointestinal: Negative.   Endocrine: Negative.   Genitourinary: Negative.   Musculoskeletal: Negative.   Skin: Negative.   Allergic/Immunologic: Negative.   Neurological: Negative.   Hematological: Negative.   Psychiatric/Behavioral: Negative.     Per HPI unless specifically indicated above     Objective:    BP 134/62 (BP Location: Left Arm)   Pulse (!) 54   Temp 97.5 F (36.4 C)   Ht 5' 11.4" (1.814 m)   Wt 183 lb 3.2 oz (83.1 kg)   SpO2 99%   BMI 25.27 kg/m   Wt Readings from Last 3 Encounters:  02/13/16 183 lb 3.2 oz (83.1 kg)  08/14/15 179 lb (81.2 kg)  06/12/15 188 lb 6.4 oz (85.5 kg)    Physical Exam  Constitutional: He is oriented to person, place, and time. He appears well-developed and well-nourished.  HENT:  Head:  Normocephalic and atraumatic.  Right Ear: External ear normal.  Left Ear: External ear normal.  Eyes: Conjunctivae and EOM are normal. Pupils are equal, round, and reactive to light.  Neck: Normal range of motion. Neck supple.  Cardiovascular: Normal rate, regular rhythm, normal heart sounds and intact distal pulses.   Pulmonary/Chest: Effort normal and breath sounds normal.  Abdominal: Soft. Bowel sounds are normal. There is no splenomegaly or hepatomegaly.  Genitourinary: Rectum normal, prostate normal and penis normal.  Musculoskeletal: Normal range of motion.  Neurological: He is alert and oriented to person, place, and time. He has normal reflexes.  Skin: No rash noted. No erythema.  Psychiatric: He has a normal mood and affect. His behavior is normal. Judgment and thought content normal.    Results for orders placed or performed in visit on 44/31/54  Basic metabolic panel  Result Value Ref Range   Glucose 92 65 - 99 mg/dL   BUN 17 8 - 27 mg/dL   Creatinine, Ser 1.25 0.76 - 1.27 mg/dL   GFR calc non Af Amer 59 (L) >59 mL/min/1.73   GFR calc Af Amer 68 >59 mL/min/1.73   BUN/Creatinine Ratio 14 10 - 24   Sodium 145 (H) 134 - 144 mmol/L   Potassium 4.3 3.5 - 5.2 mmol/L   Chloride 102 96 - 106 mmol/L   CO2 26 18 - 29 mmol/L   Calcium 9.5  8.6 - 10.2 mg/dL  LP+ALT+AST Piccolo, [employer]  Result Value Ref Range   ALT (SGPT) Piccolo, Waived 46 10 - 47 U/L   AST (SGOT) Piccolo, Waived 58 (H) 11 - 38 U/L   Cholesterol Piccolo, Waived 157 <200 mg/dL   HDL Chol Piccolo, Waived 69 >59 mg/dL   Triglycerides Piccolo,Waived 65 <150 mg/dL   Chol/HDL Ratio Piccolo,Waive 2.3 mg/dL   LDL Chol Calc Piccolo Waived 75 <100 mg/dL   VLDL Chol Calc Piccolo,Waive 13 <30 mg/dL  Hepatitis C Antibody  Result Value Ref Range   Hep C Virus Ab <0.1 0.0 - 0.9 s/co ratio      Assessment & Plan:   Problem List Items Addressed This Visit      Cardiovascular and Mediastinum   Essential hypertension -  Primary   Relevant Medications   atorvastatin (LIPITOR) 40 MG tablet   benazepril (LOTENSIN) 20 MG tablet   Other Relevant Orders   Comprehensive metabolic panel   TSH   Urinalysis, Routine w reflex microscopic (not at [facility])   CBC with Differential/Platelet   CAD (coronary artery disease)    The current medical regimen is effective;  continue present plan and medications.       Relevant Medications   atorvastatin (LIPITOR) 40 MG tablet   benazepril (LOTENSIN) 20 MG tablet   clopidogrel (PLAVIX) 75 MG tablet     Genitourinary   PIN (prostatic intraepithelial neoplasia)   Relevant Orders   PSA   Urinalysis, Routine w reflex microscopic (not at [facility])   CBC with Differential/Platelet     Other   Hypercholesteremia   Relevant Medications   atorvastatin (LIPITOR) 40 MG tablet   benazepril (LOTENSIN) 20 MG tablet   Other Relevant Orders   Lipid Panel w/o Chol/HDL Ratio   Urinalysis, Routine w reflex microscopic (not at [facility])   CBC with Differential/Platelet       Follow up plan: Return in about 6 months (around 08/12/2016) for BMP,  Lipids, ALT, AST.

## 2016-02-14 ENCOUNTER — Encounter: Payer: Self-pay | Admitting: Family Medicine

## 2016-02-14 LAB — LIPID PANEL W/O CHOL/HDL RATIO
CHOLESTEROL TOTAL: 162 mg/dL (ref 100–199)
HDL: 73 mg/dL (ref >39)
LDL CALC: 71 mg/dL (ref 0–99)
Triglycerides: 90 mg/dL (ref 0–149)
VLDL CHOLESTEROL CAL: 18 mg/dL (ref 5–40)

## 2016-02-14 LAB — COMPREHENSIVE METABOLIC PANEL
ALBUMIN: 4.7 g/dL (ref 3.6–4.8)
ALT: 25 IU/L (ref 0–44)
AST: 34 IU/L (ref 0–40)
Albumin/Globulin Ratio: 2.2 (ref 1.2–2.2)
Alkaline Phosphatase: 42 IU/L (ref 39–117)
BILIRUBIN TOTAL: 1.1 mg/dL (ref 0.0–1.2)
BUN / CREAT RATIO: 17 (ref 10–24)
BUN: 19 mg/dL (ref 8–27)
CHLORIDE: 100 mmol/L (ref 96–106)
CO2: 22 mmol/L (ref 18–29)
CREATININE: 1.12 mg/dL (ref 0.76–1.27)
Calcium: 9.3 mg/dL (ref 8.6–10.2)
GFR calc non Af Amer: 67 mL/min/{1.73_m2} (ref >59)
GFR, EST AFRICAN AMERICAN: 78 mL/min/{1.73_m2} (ref >59)
GLUCOSE: 93 mg/dL (ref 65–99)
Globulin, Total: 2.1 g/dL (ref 1.5–4.5)
Potassium: 4.2 mmol/L (ref 3.5–5.2)
Sodium: 140 mmol/L (ref 134–144)
TOTAL PROTEIN: 6.8 g/dL (ref 6.0–8.5)

## 2016-02-14 LAB — CBC WITH DIFFERENTIAL/PLATELET
Basophils Absolute: 0 10*3/uL (ref 0.0–0.2)
Basos: 1 %
EOS (ABSOLUTE): 0.1 10*3/uL (ref 0.0–0.4)
EOS: 1 %
HEMATOCRIT: 44.8 % (ref 37.5–51.0)
HEMOGLOBIN: 14.9 g/dL (ref 12.6–17.7)
Immature Grans (Abs): 0 10*3/uL (ref 0.0–0.1)
Immature Granulocytes: 0 %
LYMPHS ABS: 2 10*3/uL (ref 0.7–3.1)
Lymphs: 28 %
MCH: 30.5 pg (ref 26.6–33.0)
MCHC: 33.3 g/dL (ref 31.5–35.7)
MCV: 92 fL (ref 79–97)
MONOCYTES: 7 %
MONOS ABS: 0.5 10*3/uL (ref 0.1–0.9)
NEUTROS ABS: 4.6 10*3/uL (ref 1.4–7.0)
Neutrophils: 63 %
Platelets: 207 10*3/uL (ref 150–379)
RBC: 4.88 x10E6/uL (ref 4.14–5.80)
RDW: 13.4 % (ref 12.3–15.4)
WBC: 7.3 10*3/uL (ref 3.4–10.8)

## 2016-02-14 LAB — TSH: TSH: 3.87 u[IU]/mL (ref 0.450–4.500)

## 2016-02-14 LAB — PSA: Prostate Specific Ag, Serum: 3.7 ng/mL (ref 0.0–4.0)

## 2016-04-04 ENCOUNTER — Ambulatory Visit (INDEPENDENT_AMBULATORY_CARE_PROVIDER_SITE_OTHER): Payer: Commercial Managed Care - HMO | Admitting: Family Medicine

## 2016-04-04 ENCOUNTER — Encounter: Payer: Self-pay | Admitting: Family Medicine

## 2016-04-04 VITALS — BP 135/76 | HR 55 | Temp 97.9°F | Wt 184.0 lb

## 2016-04-04 DIAGNOSIS — J0100 Acute maxillary sinusitis, unspecified: Secondary | ICD-10-CM | POA: Diagnosis not present

## 2016-04-04 MED ORDER — AMOXICILLIN-POT CLAVULANATE 875-125 MG PO TABS: 1.0000 | ORAL_TABLET | Freq: Two times a day (BID) | ORAL | 0 refills | Status: DC

## 2016-04-04 NOTE — Progress Notes (Signed)
   BP 135/76   Pulse (!) 55   Temp 97.9 F (36.6 C)   Wt 184 lb (83.5 kg)   SpO2 100%   BMI 25.38 kg/m    Subjective:    Patient ID: Hector Woodard, male    DOB: 10-20-1945, 71 y.o.   MRN: [id number]  HPI: Hector Woodard is a 71 y.o. male  Chief Complaint  Patient presents with  . Sinusitis    x 2 weeks. Head congestion, cough, sinus drainage, mild sore throat,some laryngitis, some achey, some diarrhea, No chest congestion, no ear ache.    Patient presents with more than 2 weeks of congestion, productive cough of thick sputum, sinus pain and pressure, sore throat, and eye pressure. Denies fever, chills, CP, SOB. Taking aspirin with some relief. 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   Relevant past medical, surgical, family and social history reviewed and updated as indicated. Interim medical history since our last visit reviewed. Allergies and medications reviewed and updated.  Review of Systems  Constitutional: Negative.   HENT: Positive for congestion, sinus pain, sinus pressure and sore throat.   Eyes: Positive for pain.  Respiratory: Positive for cough.   Cardiovascular: Negative.   Gastrointestinal: Negative.   Genitourinary: Negative.   Musculoskeletal: Negative.   Neurological: Negative.   Psychiatric/Behavioral: Negative.     Per HPI unless specifically indicated above     Objective:    BP 135/76   Pulse (!) 55   Temp 97.9 F (36.6 C)   Wt 184 lb (83.5 kg)   SpO2 100%   BMI 25.38 kg/m   Wt Readings from Last 3 Encounters:  04/04/16 184 lb (83.5 kg)  02/13/16 183 lb 3.2 oz (83.1 kg)  08/14/15 179 lb (81.2 kg)    Physical Exam  Constitutional: He is oriented to person, place, and time. He appears well-developed and well-nourished. No distress.  HENT:  Head: Atraumatic.  Right Ear: External ear normal.  Left Ear: External ear normal.  Nose: Nose normal.  Mouth/Throat: Oropharynx is clear and moist.  Maxillary sinuses  ttp  Eyes: Conjunctivae are normal. No scleral icterus.  Neck: Normal range of motion. Neck supple.  Cardiovascular: Normal rate, regular rhythm and normal heart sounds.   Pulmonary/Chest: Effort normal and breath sounds normal. No respiratory distress.  Musculoskeletal: Normal range of motion.  Neurological: He is alert and oriented to person, place, and time.  Skin: Skin is warm and dry.  Psychiatric: He has a normal mood and affect. His behavior is normal.      Assessment & Plan:   Problem List Items Addressed This Visit    None    Visit Diagnoses    Acute maxillary sinusitis, recurrence not specified    -  Primary   Will treat with augmentin. Continue OTC remedies prn. Rest, good hydration, humidifiers. Follow up if no improvement   Relevant Medications   amoxicillin-clavulanate (AUGMENTIN) 875-125 MG tablet       Follow up plan: Return if symptoms worsen or fail to improve.

## 2016-04-04 NOTE — Patient Instructions (Signed)
Follow up if no improvement 

## 2016-05-06 ENCOUNTER — Encounter: Payer: Self-pay | Admitting: Family Medicine

## 2016-05-07 NOTE — Telephone Encounter (Signed)
Please see message from pt and provider.

## 2016-05-23 ENCOUNTER — Telehealth: Payer: Self-pay | Admitting: Family Medicine

## 2016-05-23 NOTE — Telephone Encounter (Signed)
Mendel Ryder from [place] [phone] regarding patients referral to Dr Charleston Ropes

## 2016-05-28 NOTE — Telephone Encounter (Signed)
[employer] will not cover for patient to see Dr. Thornell Mule. Patient notified.

## 2016-05-28 NOTE — Telephone Encounter (Signed)
Dr. Jeananne Rama,   Please put in high priority referral for patient for [facility] for Tinnitus and Vertigo. Patient requested to be seen prior to 06/12/16.

## 2016-06-04 DIAGNOSIS — H903 Sensorineural hearing loss, bilateral: Secondary | ICD-10-CM | POA: Diagnosis not present

## 2016-06-04 DIAGNOSIS — H9319 Tinnitus, unspecified ear: Secondary | ICD-10-CM | POA: Diagnosis not present

## 2016-06-04 DIAGNOSIS — H9313 Tinnitus, bilateral: Secondary | ICD-10-CM | POA: Diagnosis not present

## 2016-07-10 DIAGNOSIS — M9902 Segmental and somatic dysfunction of thoracic region: Secondary | ICD-10-CM | POA: Diagnosis not present

## 2016-07-10 DIAGNOSIS — M546 Pain in thoracic spine: Secondary | ICD-10-CM | POA: Diagnosis not present

## 2016-07-10 DIAGNOSIS — M461 Sacroiliitis, not elsewhere classified: Secondary | ICD-10-CM | POA: Diagnosis not present

## 2016-07-10 DIAGNOSIS — M9903 Segmental and somatic dysfunction of lumbar region: Secondary | ICD-10-CM | POA: Diagnosis not present

## 2016-07-10 DIAGNOSIS — M9904 Segmental and somatic dysfunction of sacral region: Secondary | ICD-10-CM | POA: Diagnosis not present

## 2016-07-10 DIAGNOSIS — M545 Low back pain: Secondary | ICD-10-CM | POA: Diagnosis not present

## 2016-07-11 DIAGNOSIS — M546 Pain in thoracic spine: Secondary | ICD-10-CM | POA: Diagnosis not present

## 2016-07-11 DIAGNOSIS — M461 Sacroiliitis, not elsewhere classified: Secondary | ICD-10-CM | POA: Diagnosis not present

## 2016-07-11 DIAGNOSIS — M9903 Segmental and somatic dysfunction of lumbar region: Secondary | ICD-10-CM | POA: Diagnosis not present

## 2016-07-11 DIAGNOSIS — M9904 Segmental and somatic dysfunction of sacral region: Secondary | ICD-10-CM | POA: Diagnosis not present

## 2016-07-11 DIAGNOSIS — M9902 Segmental and somatic dysfunction of thoracic region: Secondary | ICD-10-CM | POA: Diagnosis not present

## 2016-07-11 DIAGNOSIS — M545 Low back pain: Secondary | ICD-10-CM | POA: Diagnosis not present

## 2016-07-14 DIAGNOSIS — M9904 Segmental and somatic dysfunction of sacral region: Secondary | ICD-10-CM | POA: Diagnosis not present

## 2016-07-14 DIAGNOSIS — M545 Low back pain: Secondary | ICD-10-CM | POA: Diagnosis not present

## 2016-07-14 DIAGNOSIS — M546 Pain in thoracic spine: Secondary | ICD-10-CM | POA: Diagnosis not present

## 2016-07-14 DIAGNOSIS — M9902 Segmental and somatic dysfunction of thoracic region: Secondary | ICD-10-CM | POA: Diagnosis not present

## 2016-07-14 DIAGNOSIS — M461 Sacroiliitis, not elsewhere classified: Secondary | ICD-10-CM | POA: Diagnosis not present

## 2016-07-14 DIAGNOSIS — M9903 Segmental and somatic dysfunction of lumbar region: Secondary | ICD-10-CM | POA: Diagnosis not present

## 2016-08-12 ENCOUNTER — Ambulatory Visit: Payer: Commercial Managed Care - HMO | Admitting: Family Medicine

## 2016-08-21 ENCOUNTER — Ambulatory Visit: Payer: Self-pay | Admitting: Family Medicine

## 2016-09-02 ENCOUNTER — Ambulatory Visit: Payer: Self-pay | Admitting: Family Medicine

## 2016-09-03 ENCOUNTER — Encounter: Payer: Self-pay | Admitting: Family Medicine

## 2016-09-03 ENCOUNTER — Ambulatory Visit (INDEPENDENT_AMBULATORY_CARE_PROVIDER_SITE_OTHER): Payer: Commercial Managed Care - HMO | Admitting: Family Medicine

## 2016-09-03 VITALS — BP 126/66 | HR 52 | Ht 71.75 in | Wt 183.0 lb

## 2016-09-03 DIAGNOSIS — I2510 Atherosclerotic heart disease of native coronary artery without angina pectoris: Secondary | ICD-10-CM | POA: Diagnosis not present

## 2016-09-03 DIAGNOSIS — E7800 Pure hypercholesterolemia, unspecified: Secondary | ICD-10-CM | POA: Diagnosis not present

## 2016-09-03 DIAGNOSIS — I2583 Coronary atherosclerosis due to lipid rich plaque: Secondary | ICD-10-CM

## 2016-09-03 DIAGNOSIS — I10 Essential (primary) hypertension: Secondary | ICD-10-CM

## 2016-09-03 LAB — LP+ALT+AST PICCOLO, WAIVED
ALT (SGPT) Piccolo, Waived: 18 U/L (ref 10–47)
AST (SGOT) Piccolo, Waived: 37 U/L (ref 11–38)
CHOL/HDL RATIO PICCOLO,WAIVE: 2.2 mg/dL
CHOLESTEROL PICCOLO, WAIVED: 166 mg/dL (ref <200)
HDL CHOL PICCOLO, WAIVED: 76 mg/dL (ref >59)
LDL CHOL CALC PICCOLO WAIVED: 76 mg/dL (ref <100)
TRIGLYCERIDES PICCOLO,WAIVED: 67 mg/dL (ref <150)
VLDL Chol Calc Piccolo,Waive: 13 mg/dL (ref <30)

## 2016-09-03 NOTE — Progress Notes (Signed)
BP 126/66 (BP Location: Left Arm)   Pulse (!) 52   Ht 5' 11.75" (1.822 m)   Wt 183 lb (83 kg)   SpO2 99%   BMI 24.99 kg/m    Subjective:    Patient ID: Hector Woodard, male    DOB: 1945-10-06, 71 y.o.   MRN: [id number]  HPI: Hector Woodard is a 71 y.o. male  Chief Complaint  Patient presents with  . Follow-up  . Hypertension  . Hyperlipidemia   Patient follow-up doing well no complaints with blood pressure medication takes faithfully without problems side effects Same for cholesterol doing well. No CAD symptoms takes Plavix with some easy bruising and slight bleeding with scratching.  Relevant past medical, surgical, family and social history reviewed and updated as indicated. Interim medical history since our last visit reviewed. Allergies and medications reviewed and updated.  Review of Systems  Constitutional: Negative.   Respiratory: Negative.   Cardiovascular: Negative.     Per HPI unless specifically indicated above     Objective:    BP 126/66 (BP Location: Left Arm)   Pulse (!) 52   Ht 5' 11.75" (1.822 m)   Wt 183 lb (83 kg)   SpO2 99%   BMI 24.99 kg/m   Wt Readings from Last 3 Encounters:  09/03/16 183 lb (83 kg)  04/04/16 184 lb (83.5 kg)  02/13/16 183 lb 3.2 oz (83.1 kg)    Physical Exam  Constitutional: He is oriented to person, place, and time. He appears well-developed and well-nourished.  HENT:  Head: Normocephalic and atraumatic.  Eyes: Conjunctivae and EOM are normal.  Neck: Normal range of motion.  Cardiovascular: Normal rate, regular rhythm and normal heart sounds.   Pulmonary/Chest: Effort normal and breath sounds normal.  Musculoskeletal: Normal range of motion.  Neurological: He is alert and oriented to person, place, and time.  Skin: No erythema.  Psychiatric: He has a normal mood and affect. His behavior is normal. Judgment and thought content normal.    Results for orders placed or performed in visit on 02/13/16  Microscopic  Examination  Result Value Ref Range   WBC, UA None seen 0 - 5 /hpf   RBC, UA 0-2 0 - 2 /hpf   Epithelial Cells (non renal) None seen 0 - 10 /hpf   Bacteria, UA Few (A) None seen/Few  Comprehensive metabolic panel  Result Value Ref Range   Glucose 93 65 - 99 mg/dL   BUN 19 8 - 27 mg/dL   Creatinine, Ser 1.12 0.76 - 1.27 mg/dL   GFR calc non Af Amer 67 >59 mL/min/1.73   GFR calc Af Amer 78 >59 mL/min/1.73   BUN/Creatinine Ratio 17 10 - 24   Sodium 140 134 - 144 mmol/L   Potassium 4.2 3.5 - 5.2 mmol/L   Chloride 100 96 - 106 mmol/L   CO2 22 18 - 29 mmol/L   Calcium 9.3 8.6 - 10.2 mg/dL   Total Protein 6.8 6.0 - 8.5 g/dL   Albumin 4.7 3.6 - 4.8 g/dL   Globulin, Total 2.1 1.5 - 4.5 g/dL   Albumin/Globulin Ratio 2.2 1.2 - 2.2   Bilirubin Total 1.1 0.0 - 1.2 mg/dL   Alkaline Phosphatase 42 39 - 117 IU/L   AST 34 0 - 40 IU/L   ALT 25 0 - 44 IU/L  Lipid Panel w/o Chol/HDL Ratio  Result Value Ref Range   Cholesterol, Total 162 100 - 199 mg/dL   Triglycerides 90 0 -  149 mg/dL   HDL 73 >39 mg/dL   VLDL Cholesterol Cal 18 5 - 40 mg/dL   LDL Calculated 71 0 - 99 mg/dL  PSA  Result Value Ref Range   Prostate Specific Ag, Serum 3.7 0.0 - 4.0 ng/mL  TSH  Result Value Ref Range   TSH 3.870 0.450 - 4.500 uIU/mL  Urinalysis, Routine w reflex microscopic (not at [facility])  Result Value Ref Range   Specific Gravity, UA 1.020 1.005 - 1.030   pH, UA 5.5 5.0 - 7.5   Color, UA Yellow Yellow   Appearance Ur Clear Clear   Leukocytes, UA Negative Negative   Protein, UA Negative Negative/Trace   Glucose, UA Negative Negative   Ketones, UA 3+ (A) Negative   RBC, UA Trace (A) Negative   Bilirubin, UA Negative Negative   Urobilinogen, Ur 0.2 0.2 - 1.0 mg/dL   Nitrite, UA Negative Negative   Microscopic Examination See below:   CBC with Differential/Platelet  Result Value Ref Range   WBC 7.3 3.4 - 10.8 x10E3/uL   RBC 4.88 4.14 - 5.80 x10E6/uL   Hemoglobin 14.9 12.6 - 17.7 g/dL   Hematocrit 44.8  37.5 - 51.0 %   MCV 92 79 - 97 fL   MCH 30.5 26.6 - 33.0 pg   MCHC 33.3 31.5 - 35.7 g/dL   RDW 13.4 12.3 - 15.4 %   Platelets 207 150 - 379 x10E3/uL   Neutrophils 63 Not Estab. %   Lymphs 28 Not Estab. %   Monocytes 7 Not Estab. %   Eos 1 Not Estab. %   Basos 1 Not Estab. %   Neutrophils Absolute 4.6 1.4 - 7.0 x10E3/uL   Lymphocytes Absolute 2.0 0.7 - 3.1 x10E3/uL   Monocytes Absolute 0.5 0.1 - 0.9 x10E3/uL   EOS (ABSOLUTE) 0.1 0.0 - 0.4 x10E3/uL   Basophils Absolute 0.0 0.0 - 0.2 x10E3/uL   Immature Granulocytes 0 Not Estab. %   Immature Grans (Abs) 0.0 0.0 - 0.1 x10E3/uL      Assessment & Plan:   Problem List Items Addressed This Visit      Cardiovascular and Mediastinum   Essential hypertension - Primary    The current medical regimen is effective;  continue present plan and medications.       Relevant Orders   Basic metabolic panel   LP+ALT+AST Piccolo, Waived   CAD (coronary artery disease)    The current medical regimen is effective;  continue present plan and medications.         Other   Hypercholesteremia    The current medical regimen is effective;  continue present plan and medications.       Relevant Orders   Basic metabolic panel   LP+ALT+AST Piccolo, Waived       Follow up plan: Return in about 6 months (around 03/05/2017) for Physical Exam.

## 2016-09-03 NOTE — Assessment & Plan Note (Signed)
The current medical regimen is effective;  continue present plan and medications.  

## 2016-09-04 ENCOUNTER — Telehealth: Payer: Self-pay | Admitting: Family Medicine

## 2016-09-04 LAB — BASIC METABOLIC PANEL
BUN / CREAT RATIO: 14 (ref 10–24)
BUN: 19 mg/dL (ref 8–27)
CO2: 24 mmol/L (ref 18–29)
Calcium: 9.2 mg/dL (ref 8.6–10.2)
Chloride: 103 mmol/L (ref 96–106)
Creatinine, Ser: 1.34 mg/dL — ABNORMAL HIGH (ref 0.76–1.27)
GFR calc Af Amer: 62 mL/min/{1.73_m2} (ref >59)
GFR, EST NON AFRICAN AMERICAN: 54 mL/min/{1.73_m2} — AB (ref >59)
Glucose: 89 mg/dL (ref 65–99)
POTASSIUM: 4.5 mmol/L (ref 3.5–5.2)
SODIUM: 141 mmol/L (ref 134–144)

## 2016-09-04 NOTE — Telephone Encounter (Signed)
Phone call Discussed with patient slight decline in renal function. Patient on review has been using some ibuprofen or Aleve. Discussed not to use use Tylenol instead we'll recheck at next office visit in 6 months.

## 2016-10-16 ENCOUNTER — Encounter: Payer: Self-pay | Admitting: Family Medicine

## 2016-10-16 ENCOUNTER — Ambulatory Visit (INDEPENDENT_AMBULATORY_CARE_PROVIDER_SITE_OTHER): Payer: Medicare HMO | Admitting: Family Medicine

## 2016-10-16 VITALS — BP 156/71 | HR 68 | Temp 98.4°F | Ht 72.0 in | Wt 183.0 lb

## 2016-10-16 DIAGNOSIS — J0140 Acute pansinusitis, unspecified: Secondary | ICD-10-CM

## 2016-10-16 MED ORDER — AMOXICILLIN-POT CLAVULANATE 875-125 MG PO TABS: 1.0000 | ORAL_TABLET | Freq: Two times a day (BID) | ORAL | 0 refills | Status: DC

## 2016-10-16 NOTE — Progress Notes (Signed)
   BP (!) 156/71   Pulse 68   Temp 98.4 F (36.9 C) (Oral)   Ht 6' (1.829 m)   Wt 183 lb (83 kg)   SpO2 98%   BMI 24.82 kg/m    Subjective:    Patient ID: Hector Woodard, male    DOB: 1945/08/03, 71 y.o.   MRN: [id number]  HPI: Hector Woodard is a 71 y.o. male  Chief Complaint  Patient presents with  . Facial Pain  . Sore Throat  . Ear Fullness   Sore throat, headache, jaw pain, facial pain, ear pain, productive cough, diarrhea. Tried dayquil, nyquil, tylenol, and ice packs with minimal relief. Denies fever, chills, CP, SOB, vomiting. 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 No hx of smoking or pulmonary dz.   Relevant past medical, surgical, family and social history reviewed and updated as indicated. Interim medical history since our last visit reviewed. Allergies and medications reviewed and updated.  Review of Systems  Constitutional: Negative.   HENT: Positive for congestion, ear pain, sinus pain, sinus pressure and sore throat.   Eyes: Negative.   Respiratory: Positive for cough.   Cardiovascular: Negative.   Gastrointestinal: Negative.   Genitourinary: Negative.   Musculoskeletal: Negative.   Neurological: Positive for headaches.  Psychiatric/Behavioral: Negative.     Per HPI unless specifically indicated above     Objective:    BP (!) 156/71   Pulse 68   Temp 98.4 F (36.9 C) (Oral)   Ht 6' (1.829 m)   Wt 183 lb (83 kg)   SpO2 98%   BMI 24.82 kg/m   Wt Readings from Last 3 Encounters:  10/16/16 183 lb (83 kg)  09/03/16 183 lb (83 kg)  04/04/16 184 lb (83.5 kg)    Physical Exam  Constitutional: He is oriented to person, place, and time. He appears well-developed and well-nourished. No distress.  HENT:  Head: Atraumatic.  Sinuses ttp b/l Oropharynx erythematous and edematous Nasal mucosa injected B/l middle ear effusion, TMs injected  Eyes: Pupils are equal, round, and reactive to light. Conjunctivae are  normal.  Neck: Normal range of motion. Neck supple.  Cardiovascular: Normal rate, regular rhythm and normal heart sounds.   Pulmonary/Chest: Effort normal and breath sounds normal. No respiratory distress.  Musculoskeletal: Normal range of motion.  Neurological: He is alert and oriented to person, place, and time.  Skin: Skin is warm and dry.  Psychiatric: He has a normal mood and affect. His behavior is normal.  Nursing note and vitals reviewed.     Assessment & Plan:   Problem List Items Addressed This Visit    None    Visit Diagnoses    Acute pansinusitis, recurrence not specified    -  Primary   Augmentin sent, recommended mucinex, neti pot, humidifier, rest, good hydration. F/u if worsening or no improvement   Relevant Medications   amoxicillin-clavulanate (AUGMENTIN) 875-125 MG tablet       Follow up plan: Return if symptoms worsen or fail to improve.

## 2016-10-16 NOTE — Patient Instructions (Signed)
Neti pot, plain mucinex, humidifiers, essential oils

## 2016-10-20 ENCOUNTER — Encounter: Payer: Self-pay | Admitting: Family Medicine

## 2016-10-20 NOTE — Telephone Encounter (Signed)
Routing to provider  

## 2017-01-07 DIAGNOSIS — M7710 Lateral epicondylitis, unspecified elbow: Secondary | ICD-10-CM | POA: Insufficient documentation

## 2017-02-13 ENCOUNTER — Ambulatory Visit (INDEPENDENT_AMBULATORY_CARE_PROVIDER_SITE_OTHER): Payer: Medicare HMO

## 2017-02-13 VITALS — BP 136/70 | HR 68 | Temp 97.6°F | Resp 16 | Ht 72.0 in | Wt 187.5 lb

## 2017-02-13 DIAGNOSIS — Z0000 Encounter for general adult medical examination without abnormal findings: Secondary | ICD-10-CM

## 2017-02-13 NOTE — Progress Notes (Signed)
Subjective:   Hector Woodard is a 71 y.o. male who presents for Medicare Annual/Subsequent preventive examination.  Review of Systems:   Cardiac Risk Factors include: advanced age (>26men, >63 women);male gender;hypertension     Objective:    Vitals: BP 136/70 (BP Location: Left Arm, Patient Position: Sitting)   Pulse 68   Temp 97.6 F (36.4 C) (Temporal)   Resp 16   Ht 6' (1.829 m)   Wt 187 lb 8 oz (85 kg)   BMI 25.43 kg/m   Body mass index is 25.43 kg/m.  Tobacco Social History   Tobacco Use  Smoking Status Never Smoker  Smokeless Tobacco Never Used     Counseling given: Not Answered   Past Medical History:  Diagnosis Date  . Anginal pain ([place])   . Hypercholesteremia   . Hypertension   . PIN (prostatic intraepithelial neoplasia)   . Vertigo 2015   Past Surgical History:  Procedure Laterality Date  . COLONOSCOPY     X2  . Coronary Stent Intervention N/A 02/28/2015   Performed by Yolonda Kida, MD at [facility]  . KNEE ARTHROSCOPY WITH MEDIAL MENISECTOMY, plica excision and synovectomy Right 08/23/2014   Performed by Thornton Park, MD at [facility]  . Left Heart Cath and Coronary Angiography Left 02/28/2015   Performed by Teodoro Spray, MD at [facility]   Family History  Problem Relation Age of Onset  . Congestive Heart Failure Mother   . Heart disease Father   . Arrhythmia Sister   . Mental illness Maternal Grandmother   . Cancer Paternal Grandfather    Social History   Substance and Sexual Activity  Sexual Activity Not on file    Outpatient Encounter Medications as of 02/13/2017  Medication Sig  . aspirin EC 81 MG tablet Take 81 mg by mouth daily.  Marland Kitchen atorvastatin (LIPITOR) 40 MG tablet Take 1 tablet (40 mg total) by mouth daily.  . benazepril (LOTENSIN) 20 MG tablet Take 1 tablet (20 mg total) by mouth daily.  . clopidogrel (PLAVIX) 75 MG tablet Take 1 tablet (75 mg total) by mouth daily.  . Multiple Vitamin  (MULTIVITAMIN) capsule Take 1 capsule by mouth daily.  . Omega-3 Fatty Acids (FISH OIL) 1000 MG CAPS Take 1 capsule by mouth at bedtime.  . vitamin E 100 UNIT capsule Take 100 Units by mouth every other day.  . [DISCONTINUED] amoxicillin-clavulanate (AUGMENTIN) 875-125 MG tablet Take 1 tablet by mouth 2 (two) times daily. (Patient not taking: Reported on 02/13/2017)   No facility-administered encounter medications on file as of 02/13/2017.     Activities of Daily Living In your present state of health, do you have any difficulty performing the following activities: 02/13/2017  Hearing? N  Vision? N  Difficulty concentrating or making decisions? N  Walking or climbing stairs? N  Dressing or bathing? N  Doing errands, shopping? N  Preparing Food and eating ? N  Using the Toilet? N  In the past six months, have you accidently leaked urine? N  Do you have problems with loss of bowel control? N  Managing your Medications? N  Managing your Finances? N  Housekeeping or managing your Housekeeping? N  Some recent data might be hidden    Patient Care Team: Guadalupe Maple, MD as PCP - General (Family Medicine)   Assessment:     Exercise Activities and Dietary recommendations Current Exercise Habits: Home exercise routine, Type of exercise: strength training/weights;walking(stair climber ),  Time (Minutes): 60, Frequency (Times/Week): 3, Weekly Exercise (Minutes/Week): 180, Intensity: Moderate, Exercise limited by: None identified  Goals    None     Fall Risk Fall Risk  02/13/2017 09/03/2016 02/13/2016 06/14/2015 03/12/2015  Falls in the past year? No No Yes No No  Number falls in past yr: - - 1 - -  Injury with Fall? - - Yes - -   Depression Screen PHQ 2/9 Scores 02/13/2017 10/16/2016 02/13/2016 06/14/2015  PHQ - 2 Score 0 0 0 0  PHQ- 9 Score 0 - - 0    Cognitive Function     6CIT Screen 02/13/2017  What Year? 0 points  What month? 0 points  What time? 0 points  Count back  from 20 0 points  Months in reverse 0 points  Repeat phrase 0 points  Total Score 0    Immunization History  Administered Date(s) Administered  . Hepatitis A, Adult 11/22/2014  . Hepatitis B, adult 11/22/2014  . Influenza,inj,Quad PF,6+ Mos 02/12/2015  . Influenza-Unspecified 02/02/2014, 12/19/2015, 12/31/2016  . Pneumococcal Conjugate-13 02/12/2015  . Pneumococcal-Unspecified 03/04/2013  . Td 05/04/2006  . Tdap 05/07/2015  . Zoster 03/04/2013   Screening Tests Health Maintenance  Topic Date Due  . COLONOSCOPY  04/18/2024  . TETANUS/TDAP  05/06/2025  . INFLUENZA VACCINE  Completed  . Hepatitis C Screening  Completed  . PNA vac Low Risk Adult  Completed      Plan:     I have personally reviewed and addressed the Medicare Annual Wellness questionnaire and have noted the following in the patient's chart:  A. Medical and social history B. Use of alcohol, tobacco or illicit drugs  C. Current medications and supplements D. Functional ability and status E.  Nutritional status F.  Physical activity G. Advance directives H. List of other physicians I.  Hospitalizations, surgeries, and ER visits in previous 12 months J.  [place] such as hearing and vision if needed, cognitive and depression L. Referrals and appointments   In addition, I have reviewed and discussed with patient certain preventive protocols, quality metrics, and best practice recommendations. A written personalized care plan for preventive services as well as general preventive health recommendations were provided to patient.   Signed,  Tyler Aas, LPN Nurse Health Advisor   Nurse Notes: patient complaining of vertigo periodically. States he thought his blood pressure was dropping so he cut back on his benazipril to half a tablet a day for the last 2 months. He will bring in a copy when he comes in for his physical. Will call interm if needed.

## 2017-02-13 NOTE — Patient Instructions (Signed)
[name] , Thank you for taking time to come for your Medicare Wellness Visit. I appreciate your ongoing commitment to your health goals. Please review the following plan we discussed and let me know if I can assist you in the future.   Screening recommendations/referrals: Colonoscopy: completed 04/18/2014 Recommended yearly ophthalmology/optometry visit for glaucoma screening and checkup Recommended yearly dental visit for hygiene and checkup  Vaccinations: Influenza vaccine: up to date  Pneumococcal vaccine: up to date Tdap vaccine: up to date Shingles vaccine: up to date  Advanced directives: Please bring a copy of your health care power of attorney and living will to the office at your convenience.  Conditions/risks identified: none  Next appointment: Follow up on 03/12/2017 at 9:00am with Dr.Crissman. Follow up in one year for your annual wellness exam.  Preventive Care 69 Years and Older, Male Preventive care refers to lifestyle choices and visits with your health care provider that can promote health and wellness. What does preventive care include?  A yearly physical exam. This is also called an annual well check.  Dental exams once or twice a year.  Routine eye exams. Ask your health care provider how often you should have your eyes checked.  Personal lifestyle choices, including:  Daily care of your teeth and gums.  Regular physical activity.  Eating a healthy diet.  Avoiding tobacco and drug use.  Limiting alcohol use.  Practicing safe sex.  Taking low doses of aspirin every day.  Taking vitamin and mineral supplements as recommended by your health care provider. What happens during an annual well check? The services and screenings done by your health care provider during your annual well check will depend on your age, overall health, lifestyle risk factors, and family history of disease. Counseling  Your health care provider may ask you questions about  your:  Alcohol use.  Tobacco use.  Drug use.  Emotional well-being.  Home and relationship well-being.  Sexual activity.  Eating habits.  History of falls.  Memory and ability to understand (cognition).  Work and work Statistician. Screening  You may have the following tests or measurements:  Height, weight, and BMI.  Blood pressure.  Lipid and cholesterol levels. These may be checked every 5 years, or more frequently if you are over 43 years old.  Skin check.  Lung cancer screening. You may have this screening every year starting at age 30 if you have a 30-pack-year history of smoking and currently smoke or have quit within the past 15 years.  Fecal occult blood test (FOBT) of the stool. You may have this test every year starting at age 33.  Flexible sigmoidoscopy or colonoscopy. You may have a sigmoidoscopy every 5 years or a colonoscopy every 10 years starting at age 69.  Prostate cancer screening. Recommendations will vary depending on your family history and other risks.  Hepatitis C blood test.  Hepatitis B blood test.  Sexually transmitted disease (STD) testing.  Diabetes screening. This is done by checking your blood sugar (glucose) after you have not eaten for a while (fasting). You may have this done every 1-3 years.  Abdominal aortic aneurysm (AAA) screening. You may need this if you are a current or former smoker.  Osteoporosis. You may be screened starting at age 77 if you are at high risk. Talk with your health care provider about your test results, treatment options, and if necessary, the need for more tests. Vaccines  Your health care provider may recommend certain vaccines, such as:  Influenza vaccine. This is recommended every year.  Tetanus, diphtheria, and acellular pertussis (Tdap, Td) vaccine. You may need a Td booster every 10 years.  Zoster vaccine. You may need this after age 24.  Pneumococcal 13-valent conjugate (PCV13) vaccine.  One dose is recommended after age 9.  Pneumococcal polysaccharide (PPSV23) vaccine. One dose is recommended after age 72. Talk to your health care provider about which screenings and vaccines you need and how often you need them. This information is not intended to replace advice given to you by your health care provider. Make sure you discuss any questions you have with your health care provider. Document Released: 04/13/2015 Document Revised: 12/05/2015 Document Reviewed: 01/16/2015 Elsevier Interactive Patient Education  2017 [place] Prevention in the Home Falls can cause injuries. They can happen to people of all ages. There are many things you can do to make your home safe and to help prevent falls. What can I do on the outside of my home?  Regularly fix the edges of walkways and driveways and fix any cracks.  Remove anything that might make you trip as you walk through a door, such as a raised step or threshold.  Trim any bushes or trees on the path to your home.  Use bright outdoor lighting.  Clear any walking paths of anything that might make someone trip, such as rocks or tools.  Regularly check to see if handrails are loose or broken. Make sure that both sides of any steps have handrails.  Any raised decks and porches should have guardrails on the edges.  Have any leaves, snow, or ice cleared regularly.  Use sand or salt on walking paths during winter.  Clean up any spills in your garage right away. This includes oil or grease spills. What can I do in the bathroom?  Use night lights.  Install grab bars by the toilet and in the tub and shower. Do not use towel bars as grab bars.  Use non-skid mats or decals in the tub or shower.  If you need to sit down in the shower, use a plastic, non-slip stool.  Keep the floor dry. Clean up any water that spills on the floor as soon as it happens.  Remove soap buildup in the tub or shower regularly.  Attach bath  mats securely with double-sided non-slip rug tape.  Do not have throw rugs and other things on the floor that can make you trip. What can I do in the bedroom?  Use night lights.  Make sure that you have a light by your bed that is easy to reach.  Do not use any sheets or blankets that are too big for your bed. They should not hang down onto the floor.  Have a firm chair that has side arms. You can use this for support while you get dressed.  Do not have throw rugs and other things on the floor that can make you trip. What can I do in the kitchen?  Clean up any spills right away.  Avoid walking on wet floors.  Keep items that you use a lot in easy-to-reach places.  If you need to reach something above you, use a strong step stool that has a grab bar.  Keep electrical cords out of the way.  Do not use floor polish or wax that makes floors slippery. If you must use wax, use non-skid floor wax.  Do not have throw rugs and other things on the floor that  can make you trip. What can I do with my stairs?  Do not leave any items on the stairs.  Make sure that there are handrails on both sides of the stairs and use them. Fix handrails that are broken or loose. Make sure that handrails are as long as the stairways.  Check any carpeting to make sure that it is firmly attached to the stairs. Fix any carpet that is loose or worn.  Avoid having throw rugs at the top or bottom of the stairs. If you do have throw rugs, attach them to the floor with carpet tape.  Make sure that you have a light switch at the top of the stairs and the bottom of the stairs. If you do not have them, ask someone to add them for you. What else can I do to help prevent falls?  Wear shoes that:  Do not have high heels.  Have rubber bottoms.  Are comfortable and fit you well.  Are closed at the toe. Do not wear sandals.  If you use a stepladder:  Make sure that it is fully opened. Do not climb a closed  stepladder.  Make sure that both sides of the stepladder are locked into place.  Ask someone to hold it for you, if possible.  Clearly mark and make sure that you can see:  Any grab bars or handrails.  First and last steps.  Where the edge of each step is.  Use tools that help you move around (mobility aids) if they are needed. These include:  Canes.  Walkers.  Scooters.  Crutches.  Turn on the lights when you go into a dark area. Replace any light bulbs as soon as they burn out.  Set up your furniture so you have a clear path. Avoid moving your furniture around.  If any of your floors are uneven, fix them.  If there are any pets around you, be aware of where they are.  Review your medicines with your doctor. Some medicines can make you feel dizzy. This can increase your chance of falling. Ask your doctor what other things that you can do to help prevent falls. This information is not intended to replace advice given to you by your health care provider. Make sure you discuss any questions you have with your health care provider. Document Released: 01/11/2009 Document Revised: 08/23/2015 Document Reviewed: 04/21/2014 Elsevier Interactive Patient Education  2017 [place] provider would like to you have your annual eye exam. Please contact your current eye doctor or here are some good options for you to contact.   [facility] Address: [address] [place], [place] [address]   Address: [address] [place], [place] [address]  Phone: [phone]      Phone: [phone]  Website: [email or web address]    Website: [email or web address]     [facility]  Address: [place], [place], [place] [address]   Address: [place], [place], [place] [address] Phone: [phone]      Phone: [phone]    [facility] Address: [place], [place], [place] [address]  Phone: [phone]

## 2017-02-24 ENCOUNTER — Other Ambulatory Visit: Payer: Self-pay | Admitting: Family Medicine

## 2017-02-24 DIAGNOSIS — I10 Essential (primary) hypertension: Secondary | ICD-10-CM

## 2017-03-12 ENCOUNTER — Ambulatory Visit: Payer: Medicare HMO | Admitting: Family Medicine

## 2017-03-12 ENCOUNTER — Encounter: Payer: Self-pay | Admitting: Family Medicine

## 2017-03-12 VITALS — BP 149/69 | HR 59 | Ht 71.65 in | Wt 182.0 lb

## 2017-03-12 DIAGNOSIS — Z7189 Other specified counseling: Secondary | ICD-10-CM | POA: Diagnosis not present

## 2017-03-12 DIAGNOSIS — Z0000 Encounter for general adult medical examination without abnormal findings: Secondary | ICD-10-CM | POA: Diagnosis not present

## 2017-03-12 DIAGNOSIS — I200 Unstable angina: Secondary | ICD-10-CM | POA: Diagnosis not present

## 2017-03-12 DIAGNOSIS — I2510 Atherosclerotic heart disease of native coronary artery without angina pectoris: Secondary | ICD-10-CM | POA: Diagnosis not present

## 2017-03-12 DIAGNOSIS — E7800 Pure hypercholesterolemia, unspecified: Secondary | ICD-10-CM

## 2017-03-12 DIAGNOSIS — I10 Essential (primary) hypertension: Secondary | ICD-10-CM

## 2017-03-12 DIAGNOSIS — Z125 Encounter for screening for malignant neoplasm of prostate: Secondary | ICD-10-CM

## 2017-03-12 DIAGNOSIS — Z1329 Encounter for screening for other suspected endocrine disorder: Secondary | ICD-10-CM | POA: Diagnosis not present

## 2017-03-12 DIAGNOSIS — I2583 Coronary atherosclerosis due to lipid rich plaque: Secondary | ICD-10-CM | POA: Diagnosis not present

## 2017-03-12 LAB — URINALYSIS, ROUTINE W REFLEX MICROSCOPIC
Bilirubin, UA: NEGATIVE
Glucose, UA: NEGATIVE
LEUKOCYTES UA: NEGATIVE
NITRITE UA: NEGATIVE
PH UA: 5.5 (ref 5.0–7.5)
PROTEIN UA: NEGATIVE
SPEC GRAV UA: 1.02 (ref 1.005–1.030)
Urobilinogen, Ur: 0.2 mg/dL (ref 0.2–1.0)

## 2017-03-12 LAB — MICROSCOPIC EXAMINATION: BACTERIA UA: NONE SEEN

## 2017-03-12 MED ORDER — ATORVASTATIN CALCIUM 40 MG PO TABS: 40.0000 mg | ORAL_TABLET | Freq: Every day | ORAL | 4 refills | Status: DC

## 2017-03-12 MED ORDER — BENAZEPRIL HCL 20 MG PO TABS: 20.0000 mg | ORAL_TABLET | Freq: Every day | ORAL | 4 refills | Status: DC

## 2017-03-12 MED ORDER — CLOPIDOGREL BISULFATE 75 MG PO TABS: 75.0000 mg | ORAL_TABLET | Freq: Every day | ORAL | 4 refills | Status: DC

## 2017-03-12 NOTE — Progress Notes (Signed)
BP (!) 149/69   Pulse (!) 59   Ht 5' 11.65" (1.82 m)   Wt 182 lb (82.6 kg)   SpO2 99%   BMI 24.92 kg/m    Subjective:    Patient ID: Hector Woodard, male    DOB: 04/28/1945, 71 y.o.   MRN: [id number]  HPI: Hector Woodard is a 71 y.o. male  Chief Complaint  Patient presents with  . Annual Exam  . Shoulder Pain  patient all in all doing well does have some right lateral epicondylitis changes that developed after playing pickle ball 3 days in a row and some weight training with wrist extension with resistance. Blood pressure patient's been taking Benzapril 20 mg half a tablet a day but is been under great deal of stress with no impending  Move coming up this month.  Relevant past medical, surgical, family and social history reviewed and updated as indicated. Interim medical history since our last visit reviewed. Allergies and medications reviewed and updated.  Review of Systems  Constitutional: Negative.   HENT: Negative.   Eyes: Negative.   Respiratory: Negative.   Cardiovascular: Negative.   Gastrointestinal: Negative.   Endocrine: Negative.   Genitourinary: Negative.   Musculoskeletal: Negative.   Skin: Negative.   Allergic/Immunologic: Negative.   Neurological: Negative.   Hematological: Negative.   Psychiatric/Behavioral: Negative.     Per HPI unless specifically indicated above     Objective:    BP (!) 149/69   Pulse (!) 59   Ht 5' 11.65" (1.82 m)   Wt 182 lb (82.6 kg)   SpO2 99%   BMI 24.92 kg/m   Wt Readings from Last 3 Encounters:  03/12/17 182 lb (82.6 kg)  02/13/17 187 lb 8 oz (85 kg)  10/16/16 183 lb (83 kg)    Physical Exam  Constitutional: He is oriented to person, place, and time. He appears well-developed and well-nourished.  HENT:  Head: Normocephalic and atraumatic.  Right Ear: External ear normal.  Left Ear: External ear normal.  Eyes: Conjunctivae and EOM are normal. Pupils are equal, round, and reactive to light.  Neck: Normal  range of motion. Neck supple.  Cardiovascular: Normal rate, regular rhythm, normal heart sounds and intact distal pulses.  Pulmonary/Chest: Effort normal and breath sounds normal.  Abdominal: Soft. Bowel sounds are normal. There is no splenomegaly or hepatomegaly.  Genitourinary: Rectum normal, prostate normal and penis normal.  Musculoskeletal: Normal range of motion.  Neurological: He is alert and oriented to person, place, and time. He has normal reflexes.  Skin: No rash noted. No erythema.  Psychiatric: He has a normal mood and affect. His behavior is normal. Judgment and thought content normal.    Results for orders placed or performed in visit on 42/70/62  Basic metabolic panel  Result Value Ref Range   Glucose 89 65 - 99 mg/dL   BUN 19 8 - 27 mg/dL   Creatinine, Ser 1.34 (H) 0.76 - 1.27 mg/dL   GFR calc non Af Amer 54 (L) >59 mL/min/1.73   GFR calc Af Amer 62 >59 mL/min/1.73   BUN/Creatinine Ratio 14 10 - 24   Sodium 141 134 - 144 mmol/L   Potassium 4.5 3.5 - 5.2 mmol/L   Chloride 103 96 - 106 mmol/L   CO2 24 18 - 29 mmol/L   Calcium 9.2 8.6 - 10.2 mg/dL  LP+ALT+AST Piccolo, Waived  Result Value Ref Range   ALT (SGPT) Piccolo, Waived 18 10 - 47 U/L  AST (SGOT) Piccolo, Waived 37 11 - 38 U/L   Cholesterol Piccolo, Waived 166 <200 mg/dL   HDL Chol Piccolo, Waived 76 >59 mg/dL   Triglycerides Piccolo,Waived 67 <150 mg/dL   Chol/HDL Ratio Piccolo,Waive 2.2 mg/dL   LDL Chol Calc Piccolo Waived 76 <100 mg/dL   VLDL Chol Calc Piccolo,Waive 13 <30 mg/dL      Assessment & Plan:   Problem List Items Addressed This Visit      Cardiovascular and Mediastinum   Essential hypertension - Primary    Discuss hypertension and for now will increase Benzapril from 10 mg to 20 mg. Keep up with blood pressure and if notdoing well will reevaluate.      Relevant Medications   atorvastatin (LIPITOR) 40 MG tablet   benazepril (LOTENSIN) 20 MG tablet   Other Relevant Orders   CBC with  Differential/Platelet   Comprehensive metabolic panel   Lipid panel   Urinalysis, Routine w reflex microscopic   Unstable angina (HCC)   Relevant Medications   atorvastatin (LIPITOR) 40 MG tablet   benazepril (LOTENSIN) 20 MG tablet   CAD (coronary artery disease)    The current medical regimen is effective;  continue present plan and medications. reviewed cardiology notes and patient has requests for follow-up this winter.       Relevant Medications   atorvastatin (LIPITOR) 40 MG tablet   benazepril (LOTENSIN) 20 MG tablet   clopidogrel (PLAVIX) 75 MG tablet     Other   Hypercholesteremia    The current medical regimen is effective;  continue present plan and medications.       Relevant Medications   atorvastatin (LIPITOR) 40 MG tablet   benazepril (LOTENSIN) 20 MG tablet   Other Relevant Orders   CBC with Differential/Platelet   Comprehensive metabolic panel   Lipid panel   Urinalysis, Routine w reflex microscopic   Advanced care planning/counseling discussion    Other Visit Diagnoses    Prostate cancer screening       Relevant Orders   PSA   Thyroid disorder screen       Relevant Orders   TSH   PE (physical exam), annual         discussed tennis elbow care and treatment moderation because of mild CK D with nonsteroidals.  Follow up plan: Return in about 6 months (around 09/10/2017) for BMP,  Lipids, ALT, AST.

## 2017-03-12 NOTE — Assessment & Plan Note (Signed)
The current medical regimen is effective;  continue present plan and medications.  

## 2017-03-12 NOTE — Assessment & Plan Note (Addendum)
The current medical regimen is effective;  continue present plan and medications. reviewed cardiology notes and patient has requests for follow-up this winter.

## 2017-03-12 NOTE — Assessment & Plan Note (Signed)
Discuss hypertension and for now will increase Benzapril from 10 mg to 20 mg. Keep up with blood pressure and if notdoing well will reevaluate.

## 2017-03-13 LAB — CBC WITH DIFFERENTIAL/PLATELET
BASOS ABS: 0 10*3/uL (ref 0.0–0.2)
Basos: 1 %
EOS (ABSOLUTE): 0.1 10*3/uL (ref 0.0–0.4)
Eos: 2 %
Hematocrit: 46.2 % (ref 37.5–51.0)
Hemoglobin: 15.1 g/dL (ref 13.0–17.7)
Immature Grans (Abs): 0 10*3/uL (ref 0.0–0.1)
Immature Granulocytes: 0 %
LYMPHS ABS: 1.7 10*3/uL (ref 0.7–3.1)
LYMPHS: 31 %
MCH: 30.2 pg (ref 26.6–33.0)
MCHC: 32.7 g/dL (ref 31.5–35.7)
MCV: 92 fL (ref 79–97)
Monocytes Absolute: 0.4 10*3/uL (ref 0.1–0.9)
Monocytes: 7 %
NEUTROS ABS: 3.2 10*3/uL (ref 1.4–7.0)
Neutrophils: 59 %
PLATELETS: 225 10*3/uL (ref 150–379)
RBC: 5 x10E6/uL (ref 4.14–5.80)
RDW: 13.6 % (ref 12.3–15.4)
WBC: 5.3 10*3/uL (ref 3.4–10.8)

## 2017-03-13 LAB — COMPREHENSIVE METABOLIC PANEL
ALT: 22 IU/L (ref 0–44)
AST: 29 IU/L (ref 0–40)
Albumin/Globulin Ratio: 2 (ref 1.2–2.2)
Albumin: 4.5 g/dL (ref 3.6–4.8)
Alkaline Phosphatase: 40 IU/L (ref 39–117)
BILIRUBIN TOTAL: 0.9 mg/dL (ref 0.0–1.2)
BUN / CREAT RATIO: 13 (ref 10–24)
BUN: 14 mg/dL (ref 8–27)
CHLORIDE: 104 mmol/L (ref 96–106)
CO2: 25 mmol/L (ref 20–29)
Calcium: 9.3 mg/dL (ref 8.6–10.2)
Creatinine, Ser: 1.05 mg/dL (ref 0.76–1.27)
GFR calc Af Amer: 83 mL/min/{1.73_m2} (ref >59)
GFR calc non Af Amer: 72 mL/min/{1.73_m2} (ref >59)
GLUCOSE: 93 mg/dL (ref 65–99)
Globulin, Total: 2.2 g/dL (ref 1.5–4.5)
Potassium: 4.2 mmol/L (ref 3.5–5.2)
Sodium: 142 mmol/L (ref 134–144)
Total Protein: 6.7 g/dL (ref 6.0–8.5)

## 2017-03-13 LAB — LIPID PANEL
CHOLESTEROL TOTAL: 152 mg/dL (ref 100–199)
Chol/HDL Ratio: 2.1 ratio (ref 0.0–5.0)
HDL: 72 mg/dL (ref >39)
LDL Calculated: 69 mg/dL (ref 0–99)
Triglycerides: 55 mg/dL (ref 0–149)
VLDL CHOLESTEROL CAL: 11 mg/dL (ref 5–40)

## 2017-03-13 LAB — TSH: TSH: 3.4 u[IU]/mL (ref 0.450–4.500)

## 2017-03-13 LAB — PSA: Prostate Specific Ag, Serum: 4.8 ng/mL — ABNORMAL HIGH (ref 0.0–4.0)

## 2017-04-02 ENCOUNTER — Encounter: Payer: Self-pay | Admitting: Family Medicine

## 2017-04-02 DIAGNOSIS — M25529 Pain in unspecified elbow: Secondary | ICD-10-CM

## 2017-04-02 NOTE — Telephone Encounter (Signed)
Please advise. If appropriate referral is T'd up to [facility]. No dx. Please add.

## 2017-04-06 ENCOUNTER — Other Ambulatory Visit: Payer: Self-pay | Admitting: Family Medicine

## 2017-04-06 DIAGNOSIS — G8929 Other chronic pain: Secondary | ICD-10-CM

## 2017-04-06 DIAGNOSIS — M25521 Pain in right elbow: Principal | ICD-10-CM

## 2017-04-06 NOTE — Progress Notes (Signed)
Elbow getting worse will make referral to orthopedics.  My right elbow / arm is still sore after 4 months. Wondering if I've pinched a nerve or torn a tendon or ligament. At times the soreness/ stiffness goes all the way to my right shoulder. I can get in to see Dr Peggye Ley at [facility] (works with Dr Darlyn Chamber who performed my miniscus surgery). [facility] specializes in wrist and arm. However, she needs a written referral from you to see me. Apparently [employer] has reverted back to this written referral process.

## 2017-04-22 ENCOUNTER — Telehealth: Payer: Self-pay | Admitting: Family Medicine

## 2017-04-22 NOTE — Telephone Encounter (Signed)
Authorization submitted and certified in total. #[id number] Faxed to [facility]

## 2017-04-22 NOTE — Telephone Encounter (Signed)
Patient came to check on the referral to [facility] for his shoulder and elbow.  He states they told him they had never received the referral.  Can you resend it.   He said the appointment should be scheduled with Dr Peggye Ley.  If any questions he said to call him [phone]  Thank you

## 2017-04-23 DIAGNOSIS — E782 Mixed hyperlipidemia: Secondary | ICD-10-CM | POA: Diagnosis not present

## 2017-04-23 DIAGNOSIS — I25118 Atherosclerotic heart disease of native coronary artery with other forms of angina pectoris: Secondary | ICD-10-CM | POA: Diagnosis not present

## 2017-04-23 DIAGNOSIS — I10 Essential (primary) hypertension: Secondary | ICD-10-CM | POA: Diagnosis not present

## 2017-04-30 DIAGNOSIS — H2513 Age-related nuclear cataract, bilateral: Secondary | ICD-10-CM | POA: Diagnosis not present

## 2017-04-30 DIAGNOSIS — M7711 Lateral epicondylitis, right elbow: Secondary | ICD-10-CM | POA: Diagnosis not present

## 2017-05-06 DIAGNOSIS — I25118 Atherosclerotic heart disease of native coronary artery with other forms of angina pectoris: Secondary | ICD-10-CM | POA: Diagnosis not present

## 2017-05-14 DIAGNOSIS — I10 Essential (primary) hypertension: Secondary | ICD-10-CM | POA: Diagnosis not present

## 2017-05-14 DIAGNOSIS — I25118 Atherosclerotic heart disease of native coronary artery with other forms of angina pectoris: Secondary | ICD-10-CM | POA: Diagnosis not present

## 2017-05-14 DIAGNOSIS — R0789 Other chest pain: Secondary | ICD-10-CM | POA: Diagnosis not present

## 2017-05-14 DIAGNOSIS — E782 Mixed hyperlipidemia: Secondary | ICD-10-CM | POA: Diagnosis not present

## 2017-09-10 ENCOUNTER — Ambulatory Visit (INDEPENDENT_AMBULATORY_CARE_PROVIDER_SITE_OTHER): Payer: Medicare HMO | Admitting: Family Medicine

## 2017-09-10 ENCOUNTER — Encounter: Payer: Self-pay | Admitting: Family Medicine

## 2017-09-10 VITALS — BP 128/75 | HR 50 | Temp 97.7°F | Ht 71.0 in | Wt 184.2 lb

## 2017-09-10 DIAGNOSIS — I2583 Coronary atherosclerosis due to lipid rich plaque: Secondary | ICD-10-CM

## 2017-09-10 DIAGNOSIS — I10 Essential (primary) hypertension: Secondary | ICD-10-CM | POA: Diagnosis not present

## 2017-09-10 DIAGNOSIS — E785 Hyperlipidemia, unspecified: Secondary | ICD-10-CM | POA: Diagnosis not present

## 2017-09-10 DIAGNOSIS — I2510 Atherosclerotic heart disease of native coronary artery without angina pectoris: Secondary | ICD-10-CM | POA: Diagnosis not present

## 2017-09-10 DIAGNOSIS — E7800 Pure hypercholesterolemia, unspecified: Secondary | ICD-10-CM | POA: Diagnosis not present

## 2017-09-10 LAB — LP+ALT+AST PICCOLO, WAIVED
ALT (SGPT) PICCOLO, WAIVED: 26 U/L (ref 10–47)
AST (SGOT) Piccolo, Waived: 41 U/L — ABNORMAL HIGH (ref 11–38)
Chol/HDL Ratio Piccolo,Waive: 2.2 mg/dL
Cholesterol Piccolo, Waived: 169 mg/dL (ref <200)
HDL CHOL PICCOLO, WAIVED: 77 mg/dL (ref >59)
LDL CHOL CALC PICCOLO WAIVED: 73 mg/dL (ref <100)
TRIGLYCERIDES PICCOLO,WAIVED: 95 mg/dL (ref <150)
VLDL CHOL CALC PICCOLO,WAIVE: 19 mg/dL (ref <30)

## 2017-09-10 MED ORDER — BENAZEPRIL HCL 10 MG PO TABS: 10.0000 mg | ORAL_TABLET | Freq: Every day | ORAL | 2 refills | Status: DC

## 2017-09-10 NOTE — Assessment & Plan Note (Signed)
The current medical regimen is effective;  continue present plan and medications.  

## 2017-09-10 NOTE — Progress Notes (Signed)
BP 128/75   Pulse (!) 50   Temp 97.7 F (36.5 C) (Oral)   Ht 5\' 11"  (1.803 m)   Wt 184 lb 3.2 oz (83.6 kg)   SpO2 98%   BMI 25.69 kg/m    Subjective:    Patient ID: Hector Woodard, male    DOB: [birth year], 72 y.o.   MRN: [id number]  HPI: Hector Woodard is a 72 y.o. male  Chief Complaint  Patient presents with  . Hypertension    pt states he has been taking 10 mg of benazepril  . Hyperlipidemia  Patient all in all doing well taking atorvastatin and Benzapril without problems. Taking Plavix without problems except does bleed easily.  Otherwise doing well with good blood pressure control.  Relevant past medical, surgical, family and social history reviewed and updated as indicated. Interim medical history since our last visit reviewed. Allergies and medications reviewed and updated.  Review of Systems  Constitutional: Negative.   Respiratory: Negative.   Cardiovascular: Negative.     Per HPI unless specifically indicated above     Objective:    BP 128/75   Pulse (!) 50   Temp 97.7 F (36.5 C) (Oral)   Ht 5\' 11"  (1.803 m)   Wt 184 lb 3.2 oz (83.6 kg)   SpO2 98%   BMI 25.69 kg/m   Wt Readings from Last 3 Encounters:  09/10/17 184 lb 3.2 oz (83.6 kg)  03/12/17 182 lb (82.6 kg)  02/13/17 187 lb 8 oz (85 kg)    Physical Exam  Constitutional: He is oriented to person, place, and time. He appears well-developed and well-nourished.  HENT:  Head: Normocephalic and atraumatic.  Eyes: Conjunctivae and EOM are normal.  Neck: Normal range of motion.  Cardiovascular: Normal rate, regular rhythm and normal heart sounds.  Pulmonary/Chest: Effort normal and breath sounds normal.  Musculoskeletal: Normal range of motion.  Neurological: He is alert and oriented to person, place, and time.  Skin: No erythema.  Psychiatric: He has a normal mood and affect. His behavior is normal. Judgment and thought content normal.    Results for orders placed or performed in visit on  03/12/17  Microscopic Examination  Result Value Ref Range   WBC, UA 0-5 0 - 5 /hpf   RBC, UA 0-2 0 - 2 /hpf   Epithelial Cells (non renal) 0-10 0 - 10 /hpf   Bacteria, UA None seen None seen/Few  CBC with Differential/Platelet  Result Value Ref Range   WBC 5.3 3.4 - 10.8 x10E3/uL   RBC 5.00 4.14 - 5.80 x10E6/uL   Hemoglobin 15.1 13.0 - 17.7 g/dL   Hematocrit 46.2 37.5 - 51.0 %   MCV 92 79 - 97 fL   MCH 30.2 26.6 - 33.0 pg   MCHC 32.7 31.5 - 35.7 g/dL   RDW 13.6 12.3 - 15.4 %   Platelets 225 150 - 379 x10E3/uL   Neutrophils 59 Not Estab. %   Lymphs 31 Not Estab. %   Monocytes 7 Not Estab. %   Eos 2 Not Estab. %   Basos 1 Not Estab. %   Neutrophils Absolute 3.2 1.4 - 7.0 x10E3/uL   Lymphocytes Absolute 1.7 0.7 - 3.1 x10E3/uL   Monocytes Absolute 0.4 0.1 - 0.9 x10E3/uL   EOS (ABSOLUTE) 0.1 0.0 - 0.4 x10E3/uL   Basophils Absolute 0.0 0.0 - 0.2 x10E3/uL   Immature Granulocytes 0 Not Estab. %   Immature Grans (Abs) 0.0 0.0 - 0.1 x10E3/uL  Comprehensive  metabolic panel  Result Value Ref Range   Glucose 93 65 - 99 mg/dL   BUN 14 8 - 27 mg/dL   Creatinine, Ser 1.05 0.76 - 1.27 mg/dL   GFR calc non Af Amer 72 >59 mL/min/1.73   GFR calc Af Amer 83 >59 mL/min/1.73   BUN/Creatinine Ratio 13 10 - 24   Sodium 142 134 - 144 mmol/L   Potassium 4.2 3.5 - 5.2 mmol/L   Chloride 104 96 - 106 mmol/L   CO2 25 20 - 29 mmol/L   Calcium 9.3 8.6 - 10.2 mg/dL   Total Protein 6.7 6.0 - 8.5 g/dL   Albumin 4.5 3.6 - 4.8 g/dL   Globulin, Total 2.2 1.5 - 4.5 g/dL   Albumin/Globulin Ratio 2.0 1.2 - 2.2   Bilirubin Total 0.9 0.0 - 1.2 mg/dL   Alkaline Phosphatase 40 39 - 117 IU/L   AST 29 0 - 40 IU/L   ALT 22 0 - 44 IU/L  Lipid panel  Result Value Ref Range   Cholesterol, Total 152 100 - 199 mg/dL   Triglycerides 55 0 - 149 mg/dL   HDL 72 >39 mg/dL   VLDL Cholesterol Cal 11 5 - 40 mg/dL   LDL Calculated 69 0 - 99 mg/dL   Chol/HDL Ratio 2.1 0.0 - 5.0 ratio  PSA  Result Value Ref Range    Prostate Specific Ag, Serum 4.8 (H) 0.0 - 4.0 ng/mL  TSH  Result Value Ref Range   TSH 3.400 0.450 - 4.500 uIU/mL  Urinalysis, Routine w reflex microscopic  Result Value Ref Range   Specific Gravity, UA 1.020 1.005 - 1.030   pH, UA 5.5 5.0 - 7.5   Color, UA Yellow Yellow   Appearance Ur Clear Clear   Leukocytes, UA Negative Negative   Protein, UA Negative Negative/Trace   Glucose, UA Negative Negative   Ketones, UA 4+ (A) Negative   RBC, UA Trace (A) Negative   Bilirubin, UA Negative Negative   Urobilinogen, Ur 0.2 0.2 - 1.0 mg/dL   Nitrite, UA Negative Negative   Microscopic Examination See below:       Assessment & Plan:   Problem List Items Addressed This Visit      Cardiovascular and Mediastinum   Essential hypertension - Primary    The current medical regimen is effective;  continue present plan and medications.       Relevant Medications   aspirin EC 81 MG tablet   benazepril (LOTENSIN) 10 MG tablet   Other Relevant Orders   Basic metabolic panel   CAD (coronary artery disease)    The current medical regimen is effective;  continue present plan and medications.       Relevant Medications   aspirin EC 81 MG tablet   benazepril (LOTENSIN) 10 MG tablet     Other   Hypercholesteremia    The current medical regimen is effective;  continue present plan and medications.       Relevant Medications   aspirin EC 81 MG tablet   benazepril (LOTENSIN) 10 MG tablet    Other Visit Diagnoses    Hyperlipidemia, unspecified hyperlipidemia type       Relevant Medications   aspirin EC 81 MG tablet   benazepril (LOTENSIN) 10 MG tablet   Other Relevant Orders   LP+ALT+AST [place], Waived       Follow up plan: Return in about 6 months (around 03/12/2018) for Physical Exam.

## 2017-09-11 LAB — BASIC METABOLIC PANEL
BUN/Creatinine Ratio: 10 (ref 10–24)
BUN: 13 mg/dL (ref 8–27)
CO2: 23 mmol/L (ref 20–29)
CREATININE: 1.29 mg/dL — AB (ref 0.76–1.27)
Calcium: 9.6 mg/dL (ref 8.6–10.2)
Chloride: 103 mmol/L (ref 96–106)
GFR calc Af Amer: 65 mL/min/{1.73_m2} (ref >59)
GFR calc non Af Amer: 56 mL/min/{1.73_m2} — ABNORMAL LOW (ref >59)
Glucose: 94 mg/dL (ref 65–99)
Potassium: 4.5 mmol/L (ref 3.5–5.2)
SODIUM: 141 mmol/L (ref 134–144)

## 2017-09-14 ENCOUNTER — Encounter: Payer: Self-pay | Admitting: Family Medicine

## 2018-01-04 ENCOUNTER — Ambulatory Visit (INDEPENDENT_AMBULATORY_CARE_PROVIDER_SITE_OTHER): Payer: Medicare HMO

## 2018-01-04 DIAGNOSIS — Z23 Encounter for immunization: Secondary | ICD-10-CM

## 2018-02-15 ENCOUNTER — Ambulatory Visit: Payer: Medicare HMO

## 2018-03-03 ENCOUNTER — Encounter: Payer: Self-pay | Admitting: Family Medicine

## 2018-03-16 ENCOUNTER — Encounter: Payer: Medicare HMO | Admitting: Family Medicine

## 2018-03-18 ENCOUNTER — Ambulatory Visit: Payer: Self-pay | Admitting: Family Medicine

## 2018-03-18 ENCOUNTER — Ambulatory Visit: Payer: Medicare HMO

## 2018-04-01 ENCOUNTER — Other Ambulatory Visit: Payer: Self-pay | Admitting: Family Medicine

## 2018-04-01 DIAGNOSIS — E7800 Pure hypercholesterolemia, unspecified: Secondary | ICD-10-CM

## 2018-04-01 DIAGNOSIS — I2510 Atherosclerotic heart disease of native coronary artery without angina pectoris: Secondary | ICD-10-CM

## 2018-04-01 DIAGNOSIS — I2583 Coronary atherosclerosis due to lipid rich plaque: Principal | ICD-10-CM

## 2018-04-01 MED ORDER — ATORVASTATIN CALCIUM 40 MG PO TABS: 40.0000 mg | ORAL_TABLET | Freq: Every day | ORAL | 0 refills | Status: DC

## 2018-04-01 MED ORDER — CLOPIDOGREL BISULFATE 75 MG PO TABS
75.0000 mg | ORAL_TABLET | Freq: Every day | ORAL | 0 refills | Status: DC
Start: 2018-04-01 — End: 2018-06-03

## 2018-04-01 NOTE — Telephone Encounter (Signed)
Copied from [place] [id number]. Topic: Quick Communication - Rx Refill/Question >> Apr 01, 2018 10:42 AM Bea Graff, NT wrote: Medication: clopidogrel (PLAVIX) 75 MG tablet and atorvastatin (LIPITOR) 40 MG tablet  Has the patient contacted their pharmacy? Yes.   (Agent: If no, request that the patient contact the pharmacy for the refill.) (Agent: If yes, when and what did the pharmacy advise?)  Preferred Pharmacy (with phone number or street name): [id number] - [place], [address] AT [place] [phone] (Phone) [phone] (Fax)    Agent: Please be advised that RX refills may take up to 3 business days. We ask that you follow-up with your pharmacy.

## 2018-04-15 ENCOUNTER — Ambulatory Visit: Payer: Self-pay | Admitting: Family Medicine

## 2018-05-19 ENCOUNTER — Telehealth: Payer: Self-pay | Admitting: Family Medicine

## 2018-05-19 NOTE — Telephone Encounter (Unsigned)
Copied from [place] [id number]. Topic: Medicare AWV >> May 19, 2018  1:46 PM Sherren Kerns wrote: 05/01/2018 at 1:16 PM, I had a missed call from patient's phone number.  I returned the call with no answer.  I left a message again on the answering machine. Called to RESCHEDULE Medicare Annual Wellness Visit with the [place]. The [facility] time slots have been changed and we need to reschedule his AWV appointment for another time that 2:30 PM.  If patient returns call, please note: their last AWV was on 02/13/2017, please RESCHEDULE AWV-s with [facility] any date after today 05/19/2018.  Thank you! For any questions please contact: Janace Hoard at [phone] or Skype [email or web address]

## 2018-05-19 NOTE — Telephone Encounter (Unsigned)
Copied from [place] [id number]. Topic: Medicare AWV >> May 19, 2018 12:42 PM Sherren Kerns wrote: Called to RESCHEDULE Medicare Annual Wellness Visit with the Nurse Health Advisor due to a schedule change for the Nurse Health Advisor.   No answer.  Left a message on his voicemail to call Lattie Haw at [phone].  If patient returns call, please note: their last AWV was on 02/13/2017, please RESCHEDULE AWV-S with [facility] AFTER 05/19/2018. If you can coordinate AWV with another appointment with [facility], that would be great!    Thank you! For any questions please contact: Janace Hoard at [phone] or Skype [email or web address]

## 2018-06-02 ENCOUNTER — Ambulatory Visit (INDEPENDENT_AMBULATORY_CARE_PROVIDER_SITE_OTHER): Payer: Medicare HMO

## 2018-06-02 VITALS — BP 142/78 | HR 50 | Temp 98.5°F | Ht 71.0 in | Wt 189.0 lb

## 2018-06-02 DIAGNOSIS — Z0000 Encounter for general adult medical examination without abnormal findings: Secondary | ICD-10-CM | POA: Diagnosis not present

## 2018-06-02 NOTE — Progress Notes (Signed)
Subjective:   Hector Woodard is a 73 y.o. male who presents for Medicare Annual/Subsequent preventive examination.  Review of Systems:   Cardiac Risk Factors include: advanced age (>53men, >105 women);hypertension;dyslipidemia;male gender     Objective:    Vitals: BP (!) 142/78 (BP Location: Left Arm, Patient Position: Sitting, Cuff Size: Normal)   Pulse (!) 50   Temp 98.5 F (36.9 C) (Temporal)   Ht 5\' 11"  (1.803 m)   Wt 189 lb (85.7 kg)   BMI 26.36 kg/m   Body mass index is 26.36 kg/m.  Advanced Directives 06/02/2018 02/13/2017 03/12/2015 02/28/2015 08/23/2014  Does Patient Have a Medical Advance Directive? Yes Yes Yes Yes Yes  Type of Advance Directive Living will;Healthcare Power of [place];Living will Living will Living will Living will  Does patient want to make changes to medical advance directive? - - - - No - Patient declined  Copy of [place] in Chart? No - copy requested No - copy requested - No - copy requested -    Tobacco Social History   Tobacco Use  Smoking Status Never Smoker  Smokeless Tobacco Never Used     Counseling given: Not Answered   Clinical Intake:  Pre-visit preparation completed: Yes  Pain : 0-10 Pain Score: 1  Pain Type: Chronic pain Pain Location: Arm Pain Orientation: Right Pain Descriptors / Indicators: Discomfort Pain Onset: More than a month ago Pain Frequency: Intermittent Pain Relieving Factors: tinge unit  Effect of Pain on Daily Activities: hard to do daily activities, pain worsens with activities- about a 4/10 pain when using arm   Pain Relieving Factors: tinge unit   Nutritional Status: BMI 25 -29 Overweight Nutritional Risks: None Diabetes: No  How often do you need to have someone help you when you read instructions, pamphlets, or other written materials from your doctor or pharmacy?: 1 - Never What is the last grade level you completed in school?: bachelors    Interpreter Needed?: No  Information entered by :: Hector Larivee,LPN   Past Medical History:  Diagnosis Date  . Anginal pain ([place])   . Hypercholesteremia   . Hypertension   . PIN (prostatic intraepithelial neoplasia)   . Vertigo 2015   Past Surgical History:  Procedure Laterality Date  . CARDIAC CATHETERIZATION Left 02/28/2015   Procedure: Left Heart Cath and Coronary Angiography;  Surgeon: Teodoro Spray, MD;  Location: [facility];  Service: Cardiovascular;  Laterality: Left;  . CARDIAC CATHETERIZATION N/A 02/28/2015   Procedure: Coronary Stent Intervention;  Surgeon: Yolonda Kida, MD;  Location: [facility];  Service: Cardiovascular;  Laterality: N/A;  . COLONOSCOPY     X2  . KNEE ARTHROSCOPY WITH MEDIAL MENISECTOMY Right 08/23/2014   Procedure: KNEE ARTHROSCOPY WITH MEDIAL MENISECTOMY, plica excision and synovectomy;  Surgeon: Thornton Park, MD;  Location: [facility];  Service: Orthopedics;  Laterality: Right;   Family History  Problem Relation Age of Onset  . Congestive Heart Failure Mother   . Heart disease Father   . Arrhythmia Sister   . Mental illness Maternal Grandmother   . Cancer Paternal Grandfather    Social History   Socioeconomic History  . Marital status: Married    Spouse name: Not on file  . Number of children: Not on file  . Years of education: Not on file  . Highest education level: Bachelor's degree (e.g., BA, AB, BS)  Occupational History  . Occupation: retired   Scientific laboratory technician  .  Financial resource strain: Not hard at all  . Food insecurity:    Worry: Never true    Inability: Never true  . Transportation needs:    Medical: No    Non-medical: No  Tobacco Use  . Smoking status: Never Smoker  . Smokeless tobacco: Never Used  Substance and Sexual Activity  . Alcohol use: Yes    Alcohol/week: 5.0 standard drinks    Types: 5 Glasses of wine per week  . Drug use: No  . Sexual activity: Not on file  Lifestyle  .  Physical activity:    Days per week: 3 days    Minutes per session: 60 min  . Stress: Not at all  Relationships  . Social connections:    Talks on phone: More than three times a week    Gets together: Three times a week    Attends religious service: Never    Active member of club or organization: Yes    Attends meetings of clubs or organizations: More than 4 times per year    Relationship status: Married  Other Topics Concern  . Not on file  Social History Narrative   [place]   [place] frequently , has a home in [place] as well     Outpatient Encounter Medications as of 06/02/2018  Medication Sig  . aspirin EC 81 MG tablet Take 81 mg by mouth daily.  Marland Kitchen atorvastatin (LIPITOR) 40 MG tablet Take 1 tablet (40 mg total) by mouth daily.  . benazepril (LOTENSIN) 10 MG tablet Take 1 tablet (10 mg total) by mouth daily.  . clopidogrel (PLAVIX) 75 MG tablet Take 1 tablet (75 mg total) by mouth daily.  Marland Kitchen co-enzyme Q-10 30 MG capsule Take 30 mg by mouth 3 (three) times daily.  . Multiple Vitamin (MULTIVITAMIN) capsule Take 1 capsule by mouth daily.  . Omega-3 Fatty Acids (FISH OIL) 1000 MG CAPS Take 1 capsule by mouth at bedtime.  . [DISCONTINUED] vitamin E 100 UNIT capsule Take 100 Units by mouth every other day.   No facility-administered encounter medications on file as of 06/02/2018.     Activities of Daily Living In your present state of health, do you have any difficulty performing the following activities: 06/02/2018  Hearing? Y  Comment declines hearing aids   Vision? N  Difficulty concentrating or making decisions? Y  Walking or climbing stairs? N  Dressing or bathing? N  Doing errands, shopping? N  Preparing Food and eating ? N  Using the Toilet? N  In the past six months, have you accidently leaked urine? N  Do you have problems with loss of bowel control? N  Managing your Medications? N  Managing your Finances? N  Housekeeping or  managing your Housekeeping? N  Some recent data might be hidden    Patient Care Team: Guadalupe Maple, MD as PCP - General (Family Medicine)   Assessment:   This is a routine wellness examination for Hector Woodard.  Exercise Activities and Dietary recommendations Current Exercise Habits: Structured exercise class, Type of exercise: stretching;treadmill;strength training/weights;walking, Time (Minutes): 60, Frequency (Times/Week): 3, Weekly Exercise (Minutes/Week): 180, Intensity: Moderate, Exercise limited by: None identified  Goals   None     Fall Risk Fall Risk  06/02/2018 02/13/2017 09/03/2016 02/13/2016 06/14/2015  Falls in the past year? 0 No No Yes No  Number falls in past yr: - - - 1 -  Injury with Fall? - - -  Yes -   FALL RISK PREVENTION PERTAINING TO THE HOME:  Any stairs in or around the home? No  If so, are there any without handrails? na  Home free of loose throw rugs in walkways, pet beds, electrical cords, etc? Yes  Adequate lighting in your home to reduce risk of falls? Yes   ASSISTIVE DEVICES UTILIZED TO PREVENT FALLS:  Life alert? No  Use of a cane, walker or w/c? No  Grab bars in the bathroom? No  Shower chair or bench in shower? No  Elevated toilet seat or a handicapped toilet? No   DME ORDERS:  DME order needed?  No   TIMED UP AND GO:  Was the test performed? No .  Length of time to ambulate 10 feet: 10 sec.   GAIT:  Appearance of gait: Gait stead-fast without the use of an assistive device.  Education: Fall risk prevention has been discussed.  Intervention(s) required? No    Depression Screen PHQ 2/9 Scores 06/02/2018 02/13/2017 10/16/2016 02/13/2016  PHQ - 2 Score 0 0 0 0  PHQ- 9 Score - 0 - -    Cognitive Function     6CIT Screen 06/02/2018 02/13/2017  What Year? 0 points 0 points  What month? 0 points 0 points  What time? 0 points 0 points  Count back from 20 0 points 0 points  Months in reverse 0 points 0 points  Repeat phrase 0 points 0  points  Total Score 0 0    Immunization History  Administered Date(s) Administered  . Hepatitis A, Adult 11/22/2014  . Hepatitis B, adult 11/22/2014  . Influenza, High Dose Seasonal PF 01/04/2018  . Influenza,inj,Quad PF,6+ Mos 02/12/2015  . Influenza-Unspecified 02/02/2014, 12/19/2015, 12/31/2016  . Pneumococcal Conjugate-13 02/12/2015  . Pneumococcal-Unspecified 03/04/2013  . Td 05/04/2006  . Tdap 05/07/2015  . Zoster 03/04/2013    Qualifies for Shingles Vaccine? Yes  Zostavax completed 03/04/2013. Due for Shingrix. Education has been provided regarding the importance of this vaccine. Pt has been advised to call insurance company to determine out of pocket expense. Advised may also receive vaccine at local pharmacy or Health Dept. Verbalized acceptance and understanding.  Tdap: up to date   Flu Vaccine: up to date   Pneumococcal Vaccine: up to date   Screening Tests Health Maintenance  Topic Date Due  . COLONOSCOPY  04/18/2024  . TETANUS/TDAP  05/06/2025  . INFLUENZA VACCINE  Completed  . Hepatitis C Screening  Completed  . PNA vac Low Risk Adult  Completed   Cancer Screenings:  Colorectal Screening: Completed 04/18/2014. Repeat every 10 years    Lung Cancer Screening: (Low Dose CT Chest recommended if Age 63-80 years, 30 pack-year currently smoking OR have quit w/in 15years.) does not qualify.    Additional Screening:  Hepatitis C Screening: does qualify; Completed   Vision Screening: Recommended annual ophthalmology exams for early detection of glaucoma and other disorders of the eye. Is the patient up to date with their annual eye exam?  Yes  Who is the provider or what is the name of the office in which the pt attends annual eye exams? [facility]    Dental Screening: Recommended annual dental exams for proper oral hygiene  Community Resource Referral:  CRR required this visit?  No       Plan:    I have personally reviewed and addressed the  Medicare Annual Wellness questionnaire and have noted the following in the patient's chart:  A. Medical and social  history B. Use of alcohol, tobacco or illicit drugs  C. Current medications and supplements D. Functional ability and status E.  Nutritional status F.  Physical activity G. Advance directives H. List of other physicians I.  Hospitalizations, surgeries, and ER visits in previous 12 months J.  [place] such as hearing and vision if needed, cognitive and depression L. Referrals and appointments   In addition, I have reviewed and discussed with patient certain preventive protocols, quality metrics, and best practice recommendations. A written personalized care plan for preventive services as well as general preventive health recommendations were provided to patient.   Signed,  Tyler Aas, LPN Nurse Health Advisor   Nurse Notes: Patient coming for physical tomorrow and also wanted to discuss right shoulder and arm pain. States it started about 15 months ago. He went to [facility] year and they did an xray but states it might be more muscle related. Rates it 1/10 when not using it and 4/10 pain when mobile. He does not currently take anything for it and is using his tinge unit at times.

## 2018-06-02 NOTE — Patient Instructions (Signed)
Hector Woodard , Thank you for taking time to come for your Medicare Wellness Visit. I appreciate your ongoing commitment to your health goals. Please review the following plan we discussed and let me know if I can assist you in the future.   Screening recommendations/referrals: Colonoscopy: completed 04/18/2014 Recommended yearly ophthalmology/optometry visit for glaucoma screening and checkup Recommended yearly dental visit for hygiene and checkup  Vaccinations: Influenza vaccine: up to date  Pneumococcal vaccine: up to date  Tdap vaccine: up to date  Shingles vaccine: shingrix eligible, check with your insurance company for coverage   Advanced directives: Please bring a copy of your health care power of attorney and living will to the office at your convenience.  Conditions/risks identified: none   Next appointment: Follow up in one year for your annual wellness exam.   Preventive Care 65 Years and Older, Male Preventive care refers to lifestyle choices and visits with your health care provider that can promote health and wellness. What does preventive care include?  A yearly physical exam. This is also called an annual well check.  Dental exams once or twice a year.  Routine eye exams. Ask your health care provider how often you should have your eyes checked.  Personal lifestyle choices, including:  Daily care of your teeth and gums.  Regular physical activity.  Eating a healthy diet.  Avoiding tobacco and drug use.  Limiting alcohol use.  Practicing safe sex.  Taking low doses of aspirin every day.  Taking vitamin and mineral supplements as recommended by your health care provider. What happens during an annual well check? The services and screenings done by your health care provider during your annual well check will depend on your age, overall health, lifestyle risk factors, and family history of disease. Counseling  Your health care provider may ask you questions  about your:  Alcohol use.  Tobacco use.  Drug use.  Emotional well-being.  Home and relationship well-being.  Sexual activity.  Eating habits.  History of falls.  Memory and ability to understand (cognition).  Work and work Statistician. Screening  You may have the following tests or measurements:  Height, weight, and BMI.  Blood pressure.  Lipid and cholesterol levels. These may be checked every 5 years, or more frequently if you are over 65 years old.  Skin check.  Lung cancer screening. You may have this screening every year starting at age 12 if you have a 30-pack-year history of smoking and currently smoke or have quit within the past 15 years.  Fecal occult blood test (FOBT) of the stool. You may have this test every year starting at age 61.  Flexible sigmoidoscopy or colonoscopy. You may have a sigmoidoscopy every 5 years or a colonoscopy every 10 years starting at age 58.  Prostate cancer screening. Recommendations will vary depending on your family history and other risks.  Hepatitis C blood test.  Hepatitis B blood test.  Sexually transmitted disease (STD) testing.  Diabetes screening. This is done by checking your blood sugar (glucose) after you have not eaten for a while (fasting). You may have this done every 1-3 years.  Abdominal aortic aneurysm (AAA) screening. You may need this if you are a current or former smoker.  Osteoporosis. You may be screened starting at age 75 if you are at high risk. Talk with your health care provider about your test results, treatment options, and if necessary, the need for more tests. Vaccines  Your health care provider may recommend certain  vaccines, such as:  Influenza vaccine. This is recommended every year.  Tetanus, diphtheria, and acellular pertussis (Tdap, Td) vaccine. You may need a Td booster every 10 years.  Zoster vaccine. You may need this after age 32.  Pneumococcal 13-valent conjugate (PCV13)  vaccine. One dose is recommended after age 54.  Pneumococcal polysaccharide (PPSV23) vaccine. One dose is recommended after age 17. Talk to your health care provider about which screenings and vaccines you need and how often you need them. This information is not intended to replace advice given to you by your health care provider. Make sure you discuss any questions you have with your health care provider. Document Released: 04/13/2015 Document Revised: 12/05/2015 Document Reviewed: 01/16/2015 Elsevier Interactive Patient Education  2017 [place] Prevention in the Home Falls can cause injuries. They can happen to people of all ages. There are many things you can do to make your home safe and to help prevent falls. What can I do on the outside of my home?  Regularly fix the edges of walkways and driveways and fix any cracks.  Remove anything that might make you trip as you walk through a door, such as a raised step or threshold.  Trim any bushes or trees on the path to your home.  Use bright outdoor lighting.  Clear any walking paths of anything that might make someone trip, such as rocks or tools.  Regularly check to see if handrails are loose or broken. Make sure that both sides of any steps have handrails.  Any raised decks and porches should have guardrails on the edges.  Have any leaves, snow, or ice cleared regularly.  Use sand or salt on walking paths during winter.  Clean up any spills in your garage right away. This includes oil or grease spills. What can I do in the bathroom?  Use night lights.  Install grab bars by the toilet and in the tub and shower. Do not use towel bars as grab bars.  Use non-skid mats or decals in the tub or shower.  If you need to sit down in the shower, use a plastic, non-slip stool.  Keep the floor dry. Clean up any water that spills on the floor as soon as it happens.  Remove soap buildup in the tub or shower  regularly.  Attach bath mats securely with double-sided non-slip rug tape.  Do not have throw rugs and other things on the floor that can make you trip. What can I do in the bedroom?  Use night lights.  Make sure that you have a light by your bed that is easy to reach.  Do not use any sheets or blankets that are too big for your bed. They should not hang down onto the floor.  Have a firm chair that has side arms. You can use this for support while you get dressed.  Do not have throw rugs and other things on the floor that can make you trip. What can I do in the kitchen?  Clean up any spills right away.  Avoid walking on wet floors.  Keep items that you use a lot in easy-to-reach places.  If you need to reach something above you, use a strong step stool that has a grab bar.  Keep electrical cords out of the way.  Do not use floor polish or wax that makes floors slippery. If you must use wax, use non-skid floor wax.  Do not have throw rugs and other things  on the floor that can make you trip. What can I do with my stairs?  Do not leave any items on the stairs.  Make sure that there are handrails on both sides of the stairs and use them. Fix handrails that are broken or loose. Make sure that handrails are as long as the stairways.  Check any carpeting to make sure that it is firmly attached to the stairs. Fix any carpet that is loose or worn.  Avoid having throw rugs at the top or bottom of the stairs. If you do have throw rugs, attach them to the floor with carpet tape.  Make sure that you have a light switch at the top of the stairs and the bottom of the stairs. If you do not have them, ask someone to add them for you. What else can I do to help prevent falls?  Wear shoes that:  Do not have high heels.  Have rubber bottoms.  Are comfortable and fit you well.  Are closed at the toe. Do not wear sandals.  If you use a stepladder:  Make sure that it is fully  opened. Do not climb a closed stepladder.  Make sure that both sides of the stepladder are locked into place.  Ask someone to hold it for you, if possible.  Clearly mark and make sure that you can see:  Any grab bars or handrails.  First and last steps.  Where the edge of each step is.  Use tools that help you move around (mobility aids) if they are needed. These include:  Canes.  Walkers.  Scooters.  Crutches.  Turn on the lights when you go into a dark area. Replace any light bulbs as soon as they burn out.  Set up your furniture so you have a clear path. Avoid moving your furniture around.  If any of your floors are uneven, fix them.  If there are any pets around you, be aware of where they are.  Review your medicines with your doctor. Some medicines can make you feel dizzy. This can increase your chance of falling. Ask your doctor what other things that you can do to help prevent falls. This information is not intended to replace advice given to you by your health care provider. Make sure you discuss any questions you have with your health care provider. Document Released: 01/11/2009 Document Revised: 08/23/2015 Document Reviewed: 04/21/2014 Elsevier Interactive Patient Education  2017 [employer].

## 2018-06-03 ENCOUNTER — Ambulatory Visit: Payer: Medicare HMO

## 2018-06-03 ENCOUNTER — Encounter: Payer: Self-pay | Admitting: Family Medicine

## 2018-06-03 ENCOUNTER — Ambulatory Visit (INDEPENDENT_AMBULATORY_CARE_PROVIDER_SITE_OTHER): Payer: Medicare HMO | Admitting: Family Medicine

## 2018-06-03 VITALS — BP 136/70 | HR 52 | Temp 98.2°F | Ht 70.87 in | Wt 187.4 lb

## 2018-06-03 DIAGNOSIS — E7800 Pure hypercholesterolemia, unspecified: Secondary | ICD-10-CM | POA: Diagnosis not present

## 2018-06-03 DIAGNOSIS — I2510 Atherosclerotic heart disease of native coronary artery without angina pectoris: Secondary | ICD-10-CM | POA: Diagnosis not present

## 2018-06-03 DIAGNOSIS — G8929 Other chronic pain: Secondary | ICD-10-CM

## 2018-06-03 DIAGNOSIS — R972 Elevated prostate specific antigen [PSA]: Secondary | ICD-10-CM

## 2018-06-03 DIAGNOSIS — M25511 Pain in right shoulder: Secondary | ICD-10-CM | POA: Diagnosis not present

## 2018-06-03 DIAGNOSIS — L989 Disorder of the skin and subcutaneous tissue, unspecified: Secondary | ICD-10-CM | POA: Diagnosis not present

## 2018-06-03 DIAGNOSIS — Z7189 Other specified counseling: Secondary | ICD-10-CM | POA: Diagnosis not present

## 2018-06-03 DIAGNOSIS — I10 Essential (primary) hypertension: Secondary | ICD-10-CM

## 2018-06-03 DIAGNOSIS — I2583 Coronary atherosclerosis due to lipid rich plaque: Secondary | ICD-10-CM

## 2018-06-03 DIAGNOSIS — Z1329 Encounter for screening for other suspected endocrine disorder: Secondary | ICD-10-CM | POA: Diagnosis not present

## 2018-06-03 DIAGNOSIS — E785 Hyperlipidemia, unspecified: Secondary | ICD-10-CM | POA: Diagnosis not present

## 2018-06-03 DIAGNOSIS — Z125 Encounter for screening for malignant neoplasm of prostate: Secondary | ICD-10-CM | POA: Diagnosis not present

## 2018-06-03 LAB — URINALYSIS, ROUTINE W REFLEX MICROSCOPIC
Bilirubin, UA: NEGATIVE
Glucose, UA: NEGATIVE
Ketones, UA: NEGATIVE
Nitrite, UA: NEGATIVE
Protein, UA: NEGATIVE
RBC UA: NEGATIVE
Specific Gravity, UA: 1.01 (ref 1.005–1.030)
Urobilinogen, Ur: 0.2 mg/dL (ref 0.2–1.0)
pH, UA: 6.5 (ref 5.0–7.5)

## 2018-06-03 LAB — MICROSCOPIC EXAMINATION
BACTERIA UA: NONE SEEN
RBC, UA: NONE SEEN /hpf (ref 0–2)

## 2018-06-03 MED ORDER — ATORVASTATIN CALCIUM 40 MG PO TABS: 40.0000 mg | ORAL_TABLET | Freq: Every day | ORAL | 4 refills | Status: DC

## 2018-06-03 MED ORDER — CLOPIDOGREL BISULFATE 75 MG PO TABS: 75.0000 mg | ORAL_TABLET | Freq: Every day | ORAL | 4 refills | Status: DC

## 2018-06-03 MED ORDER — DICLOFENAC SODIUM 1 % TD GEL: 2.0000 g | Freq: Four times a day (QID) | TRANSDERMAL | 2 refills | Status: DC

## 2018-06-03 MED ORDER — BENAZEPRIL HCL 10 MG PO TABS: 10.0000 mg | ORAL_TABLET | Freq: Every day | ORAL | 4 refills | Status: DC

## 2018-06-03 NOTE — Progress Notes (Signed)
BP 136/70 (BP Location: Left Arm)   Pulse (!) 52   Temp 98.2 F (36.8 C)   Ht 5' 10.87" (1.8 m)   Wt 187 lb 6 oz (85 kg)   SpO2 100%   BMI 26.23 kg/m    Subjective:    Patient ID: Hector Woodard, male    DOB: 1945/10/20, 73 y.o.   MRN: [id number]  HPI: DAISEAN Woodard is a 73 y.o. male  Chief Complaint  Patient presents with  . Annual Exam  Patient follow-up doing well no complaints except for some right arm discomfort has been diagnosed with tennis elbow and her right elbow area also having some biceps tendinitis type issues in his arm.  Worse with playing pickle ball. Patient otherwise active getting good reports from cardiology. Blood pressure good control with home monitoring no issues with cholesterol meds and taking Plavix without bleeding or bruising issues. Has some skin lesions on his forehead and right cheek area which will bleed and bruise easily.  Relevant past medical, surgical, family and social history reviewed and updated as indicated. Interim medical history since our last visit reviewed. Allergies and medications reviewed and updated.  Review of Systems  Constitutional: Negative.   HENT: Negative.   Eyes: Negative.   Respiratory: Negative.   Cardiovascular: Negative.   Gastrointestinal: Negative.   Endocrine: Negative.   Genitourinary: Negative.   Musculoskeletal: Negative.   Skin: Negative.   Allergic/Immunologic: Negative.   Neurological: Negative.   Hematological: Negative.   Psychiatric/Behavioral: Negative.     Per HPI unless specifically indicated above     Objective:    BP 136/70 (BP Location: Left Arm)   Pulse (!) 52   Temp 98.2 F (36.8 C)   Ht 5' 10.87" (1.8 m)   Wt 187 lb 6 oz (85 kg)   SpO2 100%   BMI 26.23 kg/m   Wt Readings from Last 3 Encounters:  06/03/18 187 lb 6 oz (85 kg)  06/02/18 189 lb (85.7 kg)  09/10/17 184 lb 3.2 oz (83.6 kg)    Physical Exam Constitutional:      Appearance: He is well-developed.  HENT:     Head: Normocephalic and atraumatic.     Right Ear: External ear normal.     Left Ear: External ear normal.  Eyes:     Conjunctiva/sclera: Conjunctivae normal.     Pupils: Pupils are equal, round, and reactive to light.  Neck:     Musculoskeletal: Normal range of motion and neck supple.  Cardiovascular:     Rate and Rhythm: Normal rate and regular rhythm.     Heart sounds: Normal heart sounds.  Pulmonary:     Effort: Pulmonary effort is normal.     Breath sounds: Normal breath sounds.  Abdominal:     General: Bowel sounds are normal.     Palpations: Abdomen is soft. There is no hepatomegaly or splenomegaly.  Genitourinary:    Penis: Normal.      Rectum: Normal.     Comments: BPH changes  Musculoskeletal: Normal range of motion.  Skin:    Findings: No erythema or rash.  Neurological:     Mental Status: He is alert and oriented to person, place, and time.     Deep Tendon Reflexes: Reflexes are normal and symmetric.  Psychiatric:        Behavior: Behavior normal.        Thought Content: Thought content normal.        Judgment: Judgment normal.  Results for orders placed or performed in visit on 04/01/70  Basic metabolic panel  Result Value Ref Range   Glucose 94 65 - 99 mg/dL   BUN 13 8 - 27 mg/dL   Creatinine, Ser 1.29 (H) 0.76 - 1.27 mg/dL   GFR calc non Af Amer 56 (L) >59 mL/min/1.73   GFR calc Af Amer 65 >59 mL/min/1.73   BUN/Creatinine Ratio 10 10 - 24   Sodium 141 134 - 144 mmol/L   Potassium 4.5 3.5 - 5.2 mmol/L   Chloride 103 96 - 106 mmol/L   CO2 23 20 - 29 mmol/L   Calcium 9.6 8.6 - 10.2 mg/dL  LP+ALT+AST Piccolo, Waived  Result Value Ref Range   ALT (SGPT) Piccolo, Waived 26 10 - 47 U/L   AST (SGOT) Piccolo, Waived 41 (H) 11 - 38 U/L   Cholesterol Piccolo, Waived 169 <200 mg/dL   HDL Chol Piccolo, Waived 77 >59 mg/dL   Triglycerides Piccolo,Waived 95 <150 mg/dL   Chol/HDL Ratio Piccolo,Waive 2.2 mg/dL   LDL Chol Calc Piccolo Waived 73 <100 mg/dL    VLDL Chol Calc Piccolo,Waive 19 <30 mg/dL      Assessment & Plan:   Problem List Items Addressed This Visit      Cardiovascular and Mediastinum   Essential hypertension - Primary    The current medical regimen is effective;  continue present plan and medications.       Relevant Medications   benazepril (LOTENSIN) 10 MG tablet   atorvastatin (LIPITOR) 40 MG tablet   Other Relevant Orders   CBC with Differential/Platelet   CAD (coronary artery disease)    The current medical regimen is effective;  continue present plan and medications.       Relevant Medications   benazepril (LOTENSIN) 10 MG tablet   atorvastatin (LIPITOR) 40 MG tablet   clopidogrel (PLAVIX) 75 MG tablet   Other Relevant Orders   Lipid Panel w/o Chol/HDL Ratio     Other   Hypercholesteremia    The current medical regimen is effective;  continue present plan and medications.       Relevant Medications   benazepril (LOTENSIN) 10 MG tablet   atorvastatin (LIPITOR) 40 MG tablet   Advanced care planning/counseling discussion    A voluntary discussion about advanced care planning including explanation and discussion of advanced directives was extentively discussed with the patient.  Explained about the healthcare proxy and living will was reviewed and packet with forms with expiration of how to fill them out was given.  Time spent: Encounter 16+ min individuals present: Patient      Right shoulder pain    Will give diclofenac gel for shoulder observe response       Other Visit Diagnoses    Hyperlipidemia, unspecified hyperlipidemia type       Relevant Medications   benazepril (LOTENSIN) 10 MG tablet   atorvastatin (LIPITOR) 40 MG tablet   Other Relevant Orders   Comprehensive metabolic panel   Lipid Panel w/o Chol/HDL Ratio   Urinalysis, Routine w reflex microscopic   Thyroid disorder screen       Relevant Orders   TSH   Prostate cancer screening       Relevant Orders   PSA   Skin lesion        Relevant Orders   Ambulatory referral to Dermatology       Follow up plan: Return in about 6 months (around 12/04/2018) for BMP,  Lipids, ALT, AST.

## 2018-06-03 NOTE — Assessment & Plan Note (Signed)
The current medical regimen is effective;  continue present plan and medications.  

## 2018-06-03 NOTE — Assessment & Plan Note (Signed)
A voluntary discussion about advanced care planning including explanation and discussion of advanced directives was extentively discussed with the patient.  Explained about the healthcare proxy and living will was reviewed and packet with forms with expiration of how to fill them out was given.  Time spent: Encounter 16+ min individuals present: Patient 

## 2018-06-03 NOTE — Assessment & Plan Note (Signed)
Will give diclofenac gel for shoulder observe response

## 2018-06-04 LAB — CBC WITH DIFFERENTIAL/PLATELET
BASOS: 1 %
Basophils Absolute: 0.1 10*3/uL (ref 0.0–0.2)
EOS (ABSOLUTE): 0.1 10*3/uL (ref 0.0–0.4)
Eos: 1 %
Hematocrit: 44.6 % (ref 37.5–51.0)
Hemoglobin: 15.2 g/dL (ref 13.0–17.7)
Immature Grans (Abs): 0 10*3/uL (ref 0.0–0.1)
Immature Granulocytes: 0 %
Lymphocytes Absolute: 1.8 10*3/uL (ref 0.7–3.1)
Lymphs: 27 %
MCH: 30.5 pg (ref 26.6–33.0)
MCHC: 34.1 g/dL (ref 31.5–35.7)
MCV: 90 fL (ref 79–97)
Monocytes Absolute: 0.5 10*3/uL (ref 0.1–0.9)
Monocytes: 8 %
Neutrophils Absolute: 4 10*3/uL (ref 1.4–7.0)
Neutrophils: 63 %
PLATELETS: 211 10*3/uL (ref 150–450)
RBC: 4.98 x10E6/uL (ref 4.14–5.80)
RDW: 12.4 % (ref 11.6–15.4)
WBC: 6.5 10*3/uL (ref 3.4–10.8)

## 2018-06-04 LAB — LIPID PANEL W/O CHOL/HDL RATIO
CHOLESTEROL TOTAL: 169 mg/dL (ref 100–199)
HDL: 73 mg/dL (ref >39)
LDL Calculated: 78 mg/dL (ref 0–99)
Triglycerides: 89 mg/dL (ref 0–149)
VLDL Cholesterol Cal: 18 mg/dL (ref 5–40)

## 2018-06-04 LAB — COMPREHENSIVE METABOLIC PANEL
ALT: 16 IU/L (ref 0–44)
AST: 18 IU/L (ref 0–40)
Albumin/Globulin Ratio: 1.8 (ref 1.2–2.2)
Albumin: 4.5 g/dL (ref 3.8–4.8)
Alkaline Phosphatase: 46 IU/L (ref 39–117)
BUN/Creatinine Ratio: 11 (ref 10–24)
BUN: 14 mg/dL (ref 8–27)
Bilirubin Total: 1 mg/dL (ref 0.0–1.2)
CHLORIDE: 102 mmol/L (ref 96–106)
CO2: 27 mmol/L (ref 20–29)
Calcium: 9.6 mg/dL (ref 8.6–10.2)
Creatinine, Ser: 1.29 mg/dL — ABNORMAL HIGH (ref 0.76–1.27)
GFR calc Af Amer: 65 mL/min/{1.73_m2} (ref >59)
GFR calc non Af Amer: 56 mL/min/{1.73_m2} — ABNORMAL LOW (ref >59)
GLUCOSE: 90 mg/dL (ref 65–99)
Globulin, Total: 2.5 g/dL (ref 1.5–4.5)
Potassium: 4.7 mmol/L (ref 3.5–5.2)
Sodium: 141 mmol/L (ref 134–144)
Total Protein: 7 g/dL (ref 6.0–8.5)

## 2018-06-04 LAB — TSH: TSH: 3.74 u[IU]/mL (ref 0.450–4.500)

## 2018-06-04 LAB — PSA: Prostate Specific Ag, Serum: 5.9 ng/mL — ABNORMAL HIGH (ref 0.0–4.0)

## 2018-06-08 NOTE — Telephone Encounter (Signed)
Phone call Discussed with patient elevated PSA will recheck in 1 month or so if still elevated urology referral. Patient in and out of town will try to catch before he leaves town again.

## 2018-06-08 NOTE — Telephone Encounter (Signed)
-----   Message from Georgina Peer, [place] sent at 06/08/2018 11:48 AM EDT ----- Phone call.

## 2018-06-08 NOTE — Telephone Encounter (Signed)
-----   Message from Georgina Peer, [place] sent at 06/08/2018 11:48 AM EDT ----- Phone call.

## 2018-06-14 DIAGNOSIS — R0789 Other chest pain: Secondary | ICD-10-CM | POA: Diagnosis not present

## 2018-06-14 DIAGNOSIS — I10 Essential (primary) hypertension: Secondary | ICD-10-CM | POA: Diagnosis not present

## 2018-06-14 DIAGNOSIS — M2390 Unspecified internal derangement of unspecified knee: Secondary | ICD-10-CM | POA: Insufficient documentation

## 2018-06-14 DIAGNOSIS — M25569 Pain in unspecified knee: Secondary | ICD-10-CM | POA: Insufficient documentation

## 2018-06-14 DIAGNOSIS — S83249A Other tear of medial meniscus, current injury, unspecified knee, initial encounter: Secondary | ICD-10-CM | POA: Insufficient documentation

## 2018-06-14 DIAGNOSIS — I25118 Atherosclerotic heart disease of native coronary artery with other forms of angina pectoris: Secondary | ICD-10-CM | POA: Diagnosis not present

## 2018-06-21 DIAGNOSIS — C44319 Basal cell carcinoma of skin of other parts of face: Secondary | ICD-10-CM | POA: Diagnosis not present

## 2018-06-21 DIAGNOSIS — D485 Neoplasm of uncertain behavior of skin: Secondary | ICD-10-CM | POA: Diagnosis not present

## 2018-06-21 DIAGNOSIS — C44310 Basal cell carcinoma of skin of unspecified parts of face: Secondary | ICD-10-CM

## 2018-06-21 DIAGNOSIS — L578 Other skin changes due to chronic exposure to nonionizing radiation: Secondary | ICD-10-CM | POA: Diagnosis not present

## 2018-06-21 DIAGNOSIS — L821 Other seborrheic keratosis: Secondary | ICD-10-CM | POA: Diagnosis not present

## 2018-06-21 HISTORY — DX: Basal cell carcinoma of skin of unspecified parts of face: C44.310

## 2018-06-29 ENCOUNTER — Encounter: Payer: Self-pay | Admitting: Family Medicine

## 2018-07-04 ENCOUNTER — Encounter: Payer: Self-pay | Admitting: Family Medicine

## 2018-09-20 DIAGNOSIS — D229 Melanocytic nevi, unspecified: Secondary | ICD-10-CM | POA: Diagnosis not present

## 2018-09-20 DIAGNOSIS — D225 Melanocytic nevi of trunk: Secondary | ICD-10-CM | POA: Diagnosis not present

## 2018-09-20 DIAGNOSIS — D485 Neoplasm of uncertain behavior of skin: Secondary | ICD-10-CM | POA: Diagnosis not present

## 2018-09-20 DIAGNOSIS — L578 Other skin changes due to chronic exposure to nonionizing radiation: Secondary | ICD-10-CM | POA: Diagnosis not present

## 2018-09-20 DIAGNOSIS — Z1283 Encounter for screening for malignant neoplasm of skin: Secondary | ICD-10-CM | POA: Diagnosis not present

## 2018-09-20 DIAGNOSIS — D2239 Melanocytic nevi of other parts of face: Secondary | ICD-10-CM | POA: Diagnosis not present

## 2018-09-20 DIAGNOSIS — L821 Other seborrheic keratosis: Secondary | ICD-10-CM | POA: Diagnosis not present

## 2018-09-20 DIAGNOSIS — Z86018 Personal history of other benign neoplasm: Secondary | ICD-10-CM

## 2018-09-20 DIAGNOSIS — D2272 Melanocytic nevi of left lower limb, including hip: Secondary | ICD-10-CM | POA: Diagnosis not present

## 2018-09-20 HISTORY — DX: Personal history of other benign neoplasm: Z86.018

## 2018-11-02 ENCOUNTER — Encounter: Payer: Self-pay | Admitting: Family Medicine

## 2018-11-04 DIAGNOSIS — C44319 Basal cell carcinoma of skin of other parts of face: Secondary | ICD-10-CM | POA: Diagnosis not present

## 2018-11-04 HISTORY — PX: MOHS SURGERY: SUR867

## 2018-11-08 ENCOUNTER — Other Ambulatory Visit: Payer: Self-pay

## 2018-11-08 ENCOUNTER — Other Ambulatory Visit: Payer: Medicare HMO

## 2018-11-08 DIAGNOSIS — R972 Elevated prostate specific antigen [PSA]: Secondary | ICD-10-CM | POA: Diagnosis not present

## 2018-11-09 ENCOUNTER — Other Ambulatory Visit: Payer: Self-pay | Admitting: Family Medicine

## 2018-11-09 DIAGNOSIS — R972 Elevated prostate specific antigen [PSA]: Secondary | ICD-10-CM

## 2018-11-09 LAB — PSA: Prostate Specific Ag, Serum: 5.1 ng/mL — ABNORMAL HIGH (ref 0.0–4.0)

## 2018-11-09 NOTE — Progress Notes (Signed)
Phone call Discussed with patient elevated PSA less so than before but because of previous history of surgery for prostate will refer back to urology to further evaluate.

## 2018-11-18 DIAGNOSIS — C44319 Basal cell carcinoma of skin of other parts of face: Secondary | ICD-10-CM | POA: Diagnosis not present

## 2018-11-24 ENCOUNTER — Telehealth: Payer: Self-pay | Admitting: Family Medicine

## 2018-11-24 DIAGNOSIS — R972 Elevated prostate specific antigen [PSA]: Secondary | ICD-10-CM | POA: Diagnosis not present

## 2018-11-24 DIAGNOSIS — N401 Enlarged prostate with lower urinary tract symptoms: Secondary | ICD-10-CM | POA: Diagnosis not present

## 2018-11-24 NOTE — Chronic Care Management (AMB) (Signed)
°  Chronic Care Management   Outreach Note  11/24/2018 Name: Hector Woodard MRN: [id number] DOB: 11-23-1945  Referred by: Guadalupe Maple, MD Reason for referral : Chronic Care Management (Initial CCM outreach was unsuccessful. )   An unsuccessful telephone outreach was attempted today. The patient was referred to the case management team by for assistance with chronic care management and care coordination.   Follow Up Plan: A HIPPA compliant phone message was left for the patient providing contact information and requesting a return call.  The care management team will reach out to the patient again over the next 7 days.  If patient returns call to provider office, please advise to call [place] [facility] at [place]  [email or web address]   ??[phone]

## 2018-11-25 NOTE — Chronic Care Management (AMB) (Signed)
Chronic Care Management   Note  11/25/2018 Name: DEMETRUIS DEPAUL MRN: [id number] DOB: 1945/09/12  MEHTAB DOLBERRY is a 73 y.o. year old male who is a primary care patient of Crissman, Jeannette How, MD. I reached out to Teofilo Pod by phone today in response to a referral sent by Mr. Cosmo [facility] health plan.    Mr. Labrie was given information about Chronic Care Management services today including:  1. CCM service includes personalized support from designated clinical staff supervised by his physician, including individualized plan of care and coordination with other care providers 2. 24/7 contact phone numbers for assistance for urgent and routine care needs. 3. Service will only be billed when office clinical staff spend 20 minutes or more in a month to coordinate care. 4. Only one practitioner may furnish and bill the service in a calendar month. 5. The patient may stop CCM services at any time (effective at the end of the month) by phone call to the office staff. 6. The patient will be responsible for cost sharing (co-pay) of up to 20% of the service fee (after annual deductible is met).  Patient did not agree to services and wishes to consider information provided before deciding about enrollment in care management services.   Follow up plan: The care management team is available to follow up with the patient after provider conversation with the patient regarding recommendation for care management engagement and subsequent re-referral to the care management team.   [place]  [email or web address]   ??[phone]

## 2018-11-30 ENCOUNTER — Ambulatory Visit (INDEPENDENT_AMBULATORY_CARE_PROVIDER_SITE_OTHER): Payer: Medicare HMO | Admitting: Family Medicine

## 2018-11-30 ENCOUNTER — Other Ambulatory Visit: Payer: Self-pay

## 2018-11-30 ENCOUNTER — Encounter: Payer: Self-pay | Admitting: Family Medicine

## 2018-11-30 DIAGNOSIS — E7800 Pure hypercholesterolemia, unspecified: Secondary | ICD-10-CM | POA: Diagnosis not present

## 2018-11-30 DIAGNOSIS — I2583 Coronary atherosclerosis due to lipid rich plaque: Secondary | ICD-10-CM

## 2018-11-30 DIAGNOSIS — I10 Essential (primary) hypertension: Secondary | ICD-10-CM

## 2018-11-30 DIAGNOSIS — I2510 Atherosclerotic heart disease of native coronary artery without angina pectoris: Secondary | ICD-10-CM | POA: Diagnosis not present

## 2018-11-30 LAB — LP+ALT+AST PICCOLO, WAIVED
ALT (SGPT) Piccolo, Waived: 27 U/L (ref 10–47)
AST (SGOT) Piccolo, Waived: 35 U/L (ref 11–38)
Chol/HDL Ratio Piccolo,Waive: 2.2 mg/dL
Cholesterol Piccolo, Waived: 175 mg/dL (ref <200)
HDL Chol Piccolo, Waived: 79 mg/dL (ref >59)
LDL Chol Calc Piccolo Waived: 81 mg/dL (ref <100)
Triglycerides Piccolo,Waived: 74 mg/dL (ref <150)
VLDL Chol Calc Piccolo,Waive: 15 mg/dL (ref <30)

## 2018-11-30 NOTE — Assessment & Plan Note (Signed)
The current medical regimen is effective;  continue present plan and medications.  

## 2018-11-30 NOTE — Progress Notes (Signed)
   There were no vitals taken for this visit.   Subjective:    Patient ID: Hector Woodard, male    DOB: Jan 28, 1946, 73 y.o.   MRN: [id number]  HPI: Hector Woodard is a 73 y.o. male  Med check  Discussed with patient all in all doing well no complaints blood pressure good control no chest pain chest tightness. Followed by Dr. Eliberto Ivory for PSA and stable. Cholesterol also doing well with no complaints from medications.  Relevant past medical, surgical, family and social history reviewed and updated as indicated. Interim medical history since our last visit reviewed. Allergies and medications reviewed and updated.  Review of Systems  Constitutional: Negative.   Respiratory: Negative.   Cardiovascular: Negative.     Per HPI unless specifically indicated above     Objective:    There were no vitals taken for this visit.  Wt Readings from Last 3 Encounters:  06/03/18 187 lb 6 oz (85 kg)  06/02/18 189 lb (85.7 kg)  09/10/17 184 lb 3.2 oz (83.6 kg)    Physical Exam  Results for orders placed or performed in visit on 11/08/18  PSA  Result Value Ref Range   Prostate Specific Ag, Serum 5.1 (H) 0.0 - 4.0 ng/mL      Assessment & Plan:   Problem List Items Addressed This Visit      Cardiovascular and Mediastinum   Essential hypertension    The current medical regimen is effective;  continue present plan and medications.       Relevant Orders   Basic metabolic panel   CAD (coronary artery disease)    The current medical regimen is effective;  continue present plan and medications.         Other   Hypercholesteremia    The current medical regimen is effective;  continue present plan and medications.       Relevant Orders   LP+ALT+AST Piccolo, [employer]     Telemedicine using audio/video telecommunications for a synchronous communication visit. Today's visit due to COVID-19 isolation precautions I connected with and verified that I am speaking with the correct person  using two identifiers.   I discussed the limitations, risks, security and privacy concerns of performing an evaluation and management service by telecommunication and the availability of in person appointments. I also discussed with the patient that there may be a patient responsible charge related to this service. The patient expressed understanding and agreed to proceed. The patient's location is home. I am at home.   I discussed the assessment and treatment plan with the patient. The patient was provided an opportunity to ask questions and all were answered. The patient agreed with the plan and demonstrated an understanding of the instructions.   The patient was advised to call back or seek an in-person evaluation if the symptoms worsen or if the condition fails to improve as anticipated.   I provided 21+ minutes of time during this encounter.  Follow up plan: Return in about 6 months (around 05/30/2019) for Physical Exam.

## 2018-12-01 ENCOUNTER — Encounter: Payer: Self-pay | Admitting: Family Medicine

## 2018-12-01 LAB — BASIC METABOLIC PANEL
BUN/Creatinine Ratio: 11 (ref 10–24)
BUN: 14 mg/dL (ref 8–27)
CO2: 25 mmol/L (ref 20–29)
Calcium: 9.5 mg/dL (ref 8.6–10.2)
Chloride: 102 mmol/L (ref 96–106)
Creatinine, Ser: 1.28 mg/dL — ABNORMAL HIGH (ref 0.76–1.27)
GFR calc Af Amer: 65 mL/min/{1.73_m2} (ref >59)
GFR calc non Af Amer: 56 mL/min/{1.73_m2} — ABNORMAL LOW (ref >59)
Glucose: 96 mg/dL (ref 65–99)
Potassium: 4.3 mmol/L (ref 3.5–5.2)
Sodium: 140 mmol/L (ref 134–144)

## 2018-12-09 ENCOUNTER — Other Ambulatory Visit: Payer: Self-pay | Admitting: Urology

## 2018-12-09 DIAGNOSIS — R972 Elevated prostate specific antigen [PSA]: Secondary | ICD-10-CM

## 2018-12-20 ENCOUNTER — Ambulatory Visit
Admission: RE | Admit: 2018-12-20 | Discharge: 2018-12-20 | Disposition: A | Discharge status: Home / Self Care | Payer: Medicare HMO | Source: Ambulatory Visit | Attending: Urology | Admitting: Urology

## 2018-12-20 ENCOUNTER — Other Ambulatory Visit: Payer: Self-pay

## 2018-12-20 DIAGNOSIS — R972 Elevated prostate specific antigen [PSA]: Secondary | ICD-10-CM | POA: Diagnosis not present

## 2018-12-20 MED ORDER — GADOBUTROL 1 MMOL/ML IV SOLN
8.0000 mL | Freq: Once | INTRAVENOUS | Status: AC | PRN
  Administered 2018-12-20: 8 mL via INTRAVENOUS

## 2018-12-22 DIAGNOSIS — R972 Elevated prostate specific antigen [PSA]: Secondary | ICD-10-CM | POA: Diagnosis not present

## 2018-12-22 DIAGNOSIS — N401 Enlarged prostate with lower urinary tract symptoms: Secondary | ICD-10-CM | POA: Diagnosis not present

## 2018-12-30 DIAGNOSIS — N401 Enlarged prostate with lower urinary tract symptoms: Secondary | ICD-10-CM | POA: Diagnosis not present

## 2019-01-03 ENCOUNTER — Ambulatory Visit (INDEPENDENT_AMBULATORY_CARE_PROVIDER_SITE_OTHER): Payer: Medicare HMO

## 2019-01-03 ENCOUNTER — Other Ambulatory Visit: Payer: Self-pay

## 2019-01-03 DIAGNOSIS — Z23 Encounter for immunization: Secondary | ICD-10-CM

## 2019-01-07 DIAGNOSIS — R972 Elevated prostate specific antigen [PSA]: Secondary | ICD-10-CM | POA: Diagnosis not present

## 2019-01-07 DIAGNOSIS — N401 Enlarged prostate with lower urinary tract symptoms: Secondary | ICD-10-CM | POA: Diagnosis not present

## 2019-04-04 DIAGNOSIS — U071 COVID-19: Secondary | ICD-10-CM | POA: Diagnosis not present

## 2019-04-11 DIAGNOSIS — K529 Noninfective gastroenteritis and colitis, unspecified: Secondary | ICD-10-CM | POA: Diagnosis not present

## 2019-04-11 DIAGNOSIS — A0839 Other viral enteritis: Secondary | ICD-10-CM | POA: Diagnosis not present

## 2019-04-11 DIAGNOSIS — U071 COVID-19: Secondary | ICD-10-CM | POA: Diagnosis not present

## 2019-04-18 ENCOUNTER — Encounter: Payer: Self-pay | Admitting: Family Medicine

## 2019-05-06 DIAGNOSIS — R1084 Generalized abdominal pain: Secondary | ICD-10-CM | POA: Diagnosis not present

## 2019-05-06 DIAGNOSIS — R102 Pelvic and perineal pain: Secondary | ICD-10-CM | POA: Diagnosis not present

## 2019-05-06 DIAGNOSIS — K5900 Constipation, unspecified: Secondary | ICD-10-CM | POA: Diagnosis not present

## 2019-05-06 DIAGNOSIS — Z8616 Personal history of COVID-19: Secondary | ICD-10-CM | POA: Diagnosis not present

## 2019-05-06 DIAGNOSIS — R39198 Other difficulties with micturition: Secondary | ICD-10-CM | POA: Diagnosis not present

## 2019-05-30 ENCOUNTER — Encounter: Payer: Self-pay | Admitting: Family Medicine

## 2019-06-16 ENCOUNTER — Other Ambulatory Visit: Payer: Self-pay

## 2019-06-16 DIAGNOSIS — I10 Essential (primary) hypertension: Secondary | ICD-10-CM

## 2019-06-16 DIAGNOSIS — E7800 Pure hypercholesterolemia, unspecified: Secondary | ICD-10-CM

## 2019-06-16 DIAGNOSIS — I2583 Coronary atherosclerosis due to lipid rich plaque: Secondary | ICD-10-CM

## 2019-06-16 DIAGNOSIS — I2510 Atherosclerotic heart disease of native coronary artery without angina pectoris: Secondary | ICD-10-CM

## 2019-06-16 MED ORDER — ATORVASTATIN CALCIUM 40 MG PO TABS: 40.0000 mg | ORAL_TABLET | Freq: Every day | ORAL | 0 refills | Status: DC

## 2019-06-16 MED ORDER — CLOPIDOGREL BISULFATE 75 MG PO TABS: 75.0000 mg | ORAL_TABLET | Freq: Every day | ORAL | 0 refills | Status: DC

## 2019-06-16 MED ORDER — BENAZEPRIL HCL 10 MG PO TABS: 10.0000 mg | ORAL_TABLET | Freq: Every day | ORAL | 0 refills | Status: DC

## 2019-06-16 NOTE — Telephone Encounter (Signed)
Refill request for Benazepril, Clopidogrel, Atorvastatin LOV: 11/30/18 Next Appt: 06/23/19

## 2019-06-20 DIAGNOSIS — I10 Essential (primary) hypertension: Secondary | ICD-10-CM | POA: Diagnosis not present

## 2019-06-20 DIAGNOSIS — I25118 Atherosclerotic heart disease of native coronary artery with other forms of angina pectoris: Secondary | ICD-10-CM | POA: Diagnosis not present

## 2019-06-20 DIAGNOSIS — R42 Dizziness and giddiness: Secondary | ICD-10-CM | POA: Diagnosis not present

## 2019-06-20 DIAGNOSIS — E782 Mixed hyperlipidemia: Secondary | ICD-10-CM | POA: Diagnosis not present

## 2019-06-22 ENCOUNTER — Ambulatory Visit: Payer: Medicare HMO | Admitting: Dermatology

## 2019-06-22 ENCOUNTER — Encounter: Payer: Self-pay | Admitting: Dermatology

## 2019-06-22 ENCOUNTER — Other Ambulatory Visit: Payer: Self-pay

## 2019-06-22 DIAGNOSIS — L814 Other melanin hyperpigmentation: Secondary | ICD-10-CM

## 2019-06-22 DIAGNOSIS — Z1283 Encounter for screening for malignant neoplasm of skin: Secondary | ICD-10-CM

## 2019-06-22 DIAGNOSIS — D485 Neoplasm of uncertain behavior of skin: Secondary | ICD-10-CM | POA: Diagnosis not present

## 2019-06-22 DIAGNOSIS — Z85828 Personal history of other malignant neoplasm of skin: Secondary | ICD-10-CM

## 2019-06-22 DIAGNOSIS — L578 Other skin changes due to chronic exposure to nonionizing radiation: Secondary | ICD-10-CM | POA: Diagnosis not present

## 2019-06-22 DIAGNOSIS — D1801 Hemangioma of skin and subcutaneous tissue: Secondary | ICD-10-CM

## 2019-06-22 DIAGNOSIS — D225 Melanocytic nevi of trunk: Secondary | ICD-10-CM

## 2019-06-22 DIAGNOSIS — L905 Scar conditions and fibrosis of skin: Secondary | ICD-10-CM | POA: Diagnosis not present

## 2019-06-22 DIAGNOSIS — D229 Melanocytic nevi, unspecified: Secondary | ICD-10-CM

## 2019-06-22 DIAGNOSIS — L821 Other seborrheic keratosis: Secondary | ICD-10-CM | POA: Diagnosis not present

## 2019-06-22 NOTE — Progress Notes (Signed)
   Follow-Up Visit   Subjective  Hector Woodard is a 74 y.o. male who presents for the following: Annual Exam (Hx of BCC right mid cheek, left mid forehead - both treated with [facility] in August 2020. Hx of dysplastic nevus of left post calf).    The following portions of the chart were reviewed this encounter and updated as appropriate:     Review of Systems: No other skin or systemic complaints.  Objective  Well appearing patient in no apparent distress; mood and affect are within normal limits.  A full examination was performed including scalp, head, eyes, ears, nose, lips, neck, chest, axillae, abdomen, back, buttocks, bilateral upper extremities, bilateral lower extremities, hands, feet, fingers, toes, fingernails, and toenails. All findings within normal limits unless otherwise noted below.  Objective  generalized: Actinic changes.  Objective  Trunk: Red papules.   Objective  right mid cheek, left mid forehead: Well healed excision sites. Right mid cheek - MOHs 11/04/2018 Left mid forehead - MOHs 11/18/2018  Objective  Face, shoulders: Scattered tan macules.   Objective  left post calf: 1.4 cm brown macule  Objective  Back: Flesh colored papules.  Objective  trunk: Stuck-on, waxy, tan-brown papule or plaque --Discussed benign etiology and prognosis.   Assessment & Plan  Actinic skin damage generalized  Recommend broad spectrum SPF and photoprotection   Hemangioma of skin Trunk  Observe   History of basal cell carcinoma (BCC) right mid cheek, left mid forehead  Observe   Lentigines Face, shoulders  Observe   Neoplasm of uncertain behavior of skin left post calf  Epidermal / dermal shaving  Lesion length (cm):  1.4 Lesion width (cm):  1.4 Margin per side (cm):  0.2 Total excision diameter (cm):  1.8 Informed consent: discussed and consent obtained   Timeout: patient name, date of birth, surgical site, and procedure verified   Procedure  prep:  Patient was prepped and draped in usual sterile fashion Prep type:  Isopropyl alcohol Anesthesia: the lesion was anesthetized in a standard fashion   Anesthetic:  1% lidocaine w/ epinephrine 1-100,000 nerve block Instrument used: flexible razor blade   Hemostasis achieved with: pressure, aluminum chloride and electrodesiccation   Outcome: patient tolerated procedure well   Post-procedure details: sterile dressing applied and wound care instructions given   Dressing type: bandage and petrolatum    Specimen 1 - Surgical pathology Differential Diagnosis: Recurrent dysplastic nevus vs other  Check Margins: No Brown macule [id number]  Nevus Back  Benign-appearing.  Observation.  Call clinic for new or changing moles.  Recommend daily use of broad spectrum spf 30+ sunscreen to sun-exposed areas.     Seborrheic keratosis trunk  Observe   Return in about 1 year (around 06/21/2020) for TBSE.   I, Ashok Cordia, CMA, am acting as scribe for Sarina Ser, MD .

## 2019-06-22 NOTE — Patient Instructions (Signed)

## 2019-06-23 ENCOUNTER — Ambulatory Visit
Admission: RE | Admit: 2019-06-23 | Discharge: 2019-06-23 | Disposition: A | Discharge status: Home / Self Care | Payer: Medicare HMO | Source: Ambulatory Visit | Attending: Family Medicine | Admitting: Family Medicine

## 2019-06-23 ENCOUNTER — Encounter: Payer: Self-pay | Admitting: Family Medicine

## 2019-06-23 ENCOUNTER — Ambulatory Visit (INDEPENDENT_AMBULATORY_CARE_PROVIDER_SITE_OTHER): Payer: Medicare HMO | Admitting: Family Medicine

## 2019-06-23 ENCOUNTER — Other Ambulatory Visit: Payer: Self-pay | Admitting: Family Medicine

## 2019-06-23 VITALS — BP 137/75 | HR 62 | Temp 98.2°F | Ht 71.2 in | Wt 187.0 lb

## 2019-06-23 DIAGNOSIS — I2510 Atherosclerotic heart disease of native coronary artery without angina pectoris: Secondary | ICD-10-CM | POA: Diagnosis not present

## 2019-06-23 DIAGNOSIS — M25511 Pain in right shoulder: Secondary | ICD-10-CM | POA: Diagnosis not present

## 2019-06-23 DIAGNOSIS — M25521 Pain in right elbow: Secondary | ICD-10-CM

## 2019-06-23 DIAGNOSIS — G8929 Other chronic pain: Secondary | ICD-10-CM

## 2019-06-23 DIAGNOSIS — R0781 Pleurodynia: Secondary | ICD-10-CM

## 2019-06-23 DIAGNOSIS — M79672 Pain in left foot: Secondary | ICD-10-CM | POA: Insufficient documentation

## 2019-06-23 DIAGNOSIS — Z0000 Encounter for general adult medical examination without abnormal findings: Secondary | ICD-10-CM | POA: Diagnosis not present

## 2019-06-23 DIAGNOSIS — I2583 Coronary atherosclerosis due to lipid rich plaque: Secondary | ICD-10-CM | POA: Diagnosis not present

## 2019-06-23 DIAGNOSIS — E7800 Pure hypercholesterolemia, unspecified: Secondary | ICD-10-CM

## 2019-06-23 DIAGNOSIS — I10 Essential (primary) hypertension: Secondary | ICD-10-CM

## 2019-06-23 LAB — UA/M W/RFLX CULTURE, ROUTINE
Bilirubin, UA: NEGATIVE
Glucose, UA: NEGATIVE
Ketones, UA: NEGATIVE
Leukocytes,UA: NEGATIVE
Nitrite, UA: NEGATIVE
Protein,UA: NEGATIVE
RBC, UA: NEGATIVE
Specific Gravity, UA: 1.01 (ref 1.005–1.030)
Urobilinogen, Ur: 0.2 mg/dL (ref 0.2–1.0)
pH, UA: 6 (ref 5.0–7.5)

## 2019-06-23 NOTE — Patient Instructions (Addendum)
[address], [place], [place] [id number]

## 2019-06-23 NOTE — Progress Notes (Signed)
BP 137/75   Pulse 62   Temp 98.2 F (36.8 C) (Oral)   Ht 5' 11.2" (1.808 m)   Wt 187 lb (84.8 kg)   BMI 25.93 kg/m    Subjective:    Patient ID: Hector Woodard, male    DOB: 1946-01-06, 74 y.o.   MRN: [id number]  HPI: Hector Woodard is a 74 y.o. male presenting on 06/23/2019 for comprehensive medical examination. Current medical complaints include:see below  Right elbow pain since starting playing Pickleball, trying diclofenac gel as needed. Seems to flare up with excessive use of arm. Went to [facility] for right shoulder and elbow issues and states they didn't find any major issues. Did have a fall playing tag football 5 weeks ago onto right shoulder which is still very sore into the chest.   Left heel pain, thinking he has a bone spur. Has had plantar fasciitis in the past and this feels similar. Stretching and walking it out does help some.   Hx of ruptured lumbar disc years ago, intermittent back pain flares since.   HTN - Not checking BPs consistently. Taking medications faithfully without side effects. Denies CP, SOB, HAs, dizziness.   CAD, HLD - Taking plavix and lipitor, baby aspirin. Denies angina, SOB, claudication, myalgias. Stays very active, eats healthy.   He currently lives with: Interim Problems from his last visit: no  Depression Screen done today and results listed below:  Depression screen [facility] 2/9 06/23/2019 06/02/2018 02/13/2017 10/16/2016 02/13/2016  Decreased Interest 0 0 0 0 0  Down, Depressed, Hopeless 0 0 0 0 0  PHQ - 2 Score 0 0 0 0 0  Altered sleeping - - 0 - -  Tired, decreased energy - - 0 - -  Change in appetite - - 0 - -  Feeling bad or failure about yourself  - - 0 - -  Trouble concentrating - - 0 - -  Moving slowly or fidgety/restless - - 0 - -  Suicidal thoughts - - 0 - -  PHQ-9 Score - - 0 - -  Difficult doing work/chores - - Not difficult at all - -    The patient does not have a history of falls. I did complete a risk assessment  for falls. A plan of care for falls was documented.   Past Medical History:  Past Medical History:  Diagnosis Date  . Anginal pain ([place])   . Basal cell carcinoma (BCC) of face 06/21/2018   R mid cheek, Mohs 11/04/2018,  L mid forehead, Mohs 11/18/2018  . Hx of dysplastic nevus 09/20/2018   L posterior calf, moderate atypia  . Hypercholesteremia   . Hypertension   . PIN (prostatic intraepithelial neoplasia)   . Vertigo 2015    Surgical History:  Past Surgical History:  Procedure Laterality Date  . CARDIAC CATHETERIZATION Left 02/28/2015   Procedure: Left Heart Cath and Coronary Angiography;  Surgeon: Teodoro Spray, MD;  Location: [place] [facility];  Service: Cardiovascular;  Laterality: Left;  . CARDIAC CATHETERIZATION N/A 02/28/2015   Procedure: Coronary Stent Intervention;  Surgeon: Yolonda Kida, MD;  Location: [place] [facility];  Service: Cardiovascular;  Laterality: N/A;  . COLONOSCOPY     X2  . KNEE ARTHROSCOPY WITH MEDIAL MENISECTOMY Right 08/23/2014   Procedure: KNEE ARTHROSCOPY WITH MEDIAL MENISECTOMY, plica excision and synovectomy;  Surgeon: Thornton Park, MD;  Location: [facility];  Service: Orthopedics;  Laterality: Right;  . MOHS SURGERY  11/04/2018   right  mid cheek, 11/18/18 left mid forehead    Medications:  Current Outpatient Medications on File Prior to Visit  Medication Sig  . aspirin EC 81 MG tablet Take 81 mg by mouth daily.  Marland Kitchen atorvastatin (LIPITOR) 40 MG tablet Take 1 tablet (40 mg total) by mouth daily.  . benazepril (LOTENSIN) 10 MG tablet Take 1 tablet (10 mg total) by mouth daily.  . clopidogrel (PLAVIX) 75 MG tablet Take 1 tablet (75 mg total) by mouth daily.  Marland Kitchen dutasteride (AVODART) 0.5 MG capsule Take by mouth daily.   . Multiple Vitamin (MULTIVITAMIN) capsule Take 1 capsule by mouth daily.  . Omega-3 Fatty Acids (FISH OIL) 1000 MG CAPS Take 1 capsule by mouth at bedtime.  . vitamin E 180 MG (400 UNITS) capsule Take by mouth as needed.     No current facility-administered medications on file prior to visit.    Allergies:  Allergies  Allergen Reactions  . Crestor [Rosuvastatin Calcium] Rash    Had started three new meds, few days later had a rash. Stopped the Crestor and rash is improved. Other 2 meds were continued.    Social History:  Social History   Socioeconomic History  . Marital status: Married    Spouse name: Not on file  . Number of children: Not on file  . Years of education: Not on file  . Highest education level: Bachelor's degree (e.g., BA, AB, BS)  Occupational History  . Occupation: retired   Tobacco Use  . Smoking status: Never Smoker  . Smokeless tobacco: Never Used  Substance and Sexual Activity  . Alcohol use: Yes    Alcohol/week: 5.0 standard drinks    Types: 5 Glasses of wine per week  . Drug use: No  . Sexual activity: Not on file  Other Topics Concern  . Not on file  Social History Narrative   [place] ball   [place] frequently , has a home in [place] as well    Social Determinants of Radio broadcast assistant Strain:   . Difficulty of Paying Living Expenses:   Food Insecurity:   . Worried About Charity fundraiser in the Last Year:   . Arboriculturist in the Last Year:   Transportation Needs:   . Film/video editor (Medical):   Marland Kitchen Lack of Transportation (Non-Medical):   Physical Activity:   . Days of Exercise per Week:   . Minutes of Exercise per Session:   Stress:   . Feeling of Stress :   Social Connections:   . Frequency of Communication with Friends and Family:   . Frequency of Social Gatherings with Friends and Family:   . Attends Religious Services:   . Active Member of Clubs or Organizations:   . Attends Archivist Meetings:   Marland Kitchen Marital Status:   Intimate Partner Violence:   . Fear of Current or Ex-Partner:   . Emotionally Abused:   Marland Kitchen Physically Abused:   . Sexually Abused:    Social History   Tobacco  Use  Smoking Status Never Smoker  Smokeless Tobacco Never Used   Social History   Substance and Sexual Activity  Alcohol Use Yes  . Alcohol/week: 5.0 standard drinks  . Types: 5 Glasses of wine per week    Family History:  Family History  Problem Relation Age of Onset  . Congestive Heart Failure Mother   . Heart disease Father   .  Arrhythmia Sister   . Mental illness Maternal Grandmother   . Cancer Paternal Grandfather     Past medical history, surgical history, medications, allergies, family history and social history reviewed with patient today and changes made to appropriate areas of the chart.   Review of Systems - General ROS: negative Psychological ROS: negative Ophthalmic ROS: negative ENT ROS: negative Allergy and Immunology ROS: negative Hematological and Lymphatic ROS: negative Endocrine ROS: negative Breast ROS: negative for breast lumps Respiratory ROS: no cough, shortness of breath, or wheezing Cardiovascular ROS: no chest pain or dyspnea on exertion Gastrointestinal ROS: no abdominal pain, change in bowel habits, or black or bloody stools Genito-Urinary ROS: no dysuria, trouble voiding, or hematuria Musculoskeletal ROS: positive for - joint pain and joint stiffness Neurological ROS: no TIA or stroke symptoms Dermatological ROS: negative All other ROS negative except what is listed above and in the HPI.      Objective:    BP 137/75   Pulse 62   Temp 98.2 F (36.8 C) (Oral)   Ht 5' 11.2" (1.808 m)   Wt 187 lb (84.8 kg)   BMI 25.93 kg/m   Wt Readings from Last 3 Encounters:  06/23/19 187 lb (84.8 kg)  06/03/18 187 lb 6 oz (85 kg)  06/02/18 189 lb (85.7 kg)    Physical Exam Vitals and nursing note reviewed.  Constitutional:      General: He is not in acute distress.    Appearance: He is well-developed.  HENT:     Head: Atraumatic.     Right Ear: Tympanic membrane and external ear normal.     Left Ear: Tympanic membrane and external ear  normal.     Nose: Nose normal.     Mouth/Throat:     Mouth: Mucous membranes are moist.     Pharynx: Oropharynx is clear.  Eyes:     General: No scleral icterus.    Conjunctiva/sclera: Conjunctivae normal.     Pupils: Pupils are equal, round, and reactive to light.  Cardiovascular:     Rate and Rhythm: Normal rate and regular rhythm.     Heart sounds: Normal heart sounds. No murmur.  Pulmonary:     Effort: Pulmonary effort is normal. No respiratory distress.     Breath sounds: Normal breath sounds.  Abdominal:     General: Bowel sounds are normal. There is no distension.     Palpations: Abdomen is soft. There is no mass.     Tenderness: There is no abdominal tenderness. There is no guarding.  Genitourinary:    Comments: GU exam done through Urology Musculoskeletal:        General: No swelling, tenderness or deformity. Normal range of motion.     Cervical back: Normal range of motion and neck supple.  Skin:    General: Skin is warm and dry.     Findings: No erythema or rash.  Neurological:     General: No focal deficit present.     Mental Status: He is alert and oriented to person, place, and time.     Deep Tendon Reflexes: Reflexes are normal and symmetric.  Psychiatric:        Mood and Affect: Mood normal.        Behavior: Behavior normal.        Thought Content: Thought content normal.        Judgment: Judgment normal.     Results for orders placed or performed in visit on 06/23/19  CBC with Differential/Platelet  Result Value Ref Range   WBC 5.8 3.4 - 10.8 x10E3/uL   RBC 5.30 4.14 - 5.80 x10E6/uL   Hemoglobin 16.0 13.0 - 17.7 g/dL   Hematocrit 48.7 37.5 - 51.0 %   MCV 92 79 - 97 fL   MCH 30.2 26.6 - 33.0 pg   MCHC 32.9 31.5 - 35.7 g/dL   RDW 13.1 11.6 - 15.4 %   Platelets 212 150 - 450 x10E3/uL   Neutrophils 47 Not Estab. %   Lymphs 40 Not Estab. %   Monocytes 9 Not Estab. %   Eos 3 Not Estab. %   Basos 1 Not Estab. %   Neutrophils Absolute 2.7 1.4 - 7.0  x10E3/uL   Lymphocytes Absolute 2.3 0.7 - 3.1 x10E3/uL   Monocytes Absolute 0.5 0.1 - 0.9 x10E3/uL   EOS (ABSOLUTE) 0.2 0.0 - 0.4 x10E3/uL   Basophils Absolute 0.1 0.0 - 0.2 x10E3/uL   Immature Granulocytes 0 Not Estab. %   Immature Grans (Abs) 0.0 0.0 - 0.1 x10E3/uL  Comprehensive metabolic panel  Result Value Ref Range   Glucose 97 65 - 99 mg/dL   BUN 12 8 - 27 mg/dL   Creatinine, Ser 1.23 0.76 - 1.27 mg/dL   GFR calc non Af Amer 59 (L) >59 mL/min/1.73   GFR calc Af Amer 68 >59 mL/min/1.73   BUN/Creatinine Ratio 10 10 - 24   Sodium 141 134 - 144 mmol/L   Potassium 4.4 3.5 - 5.2 mmol/L   Chloride 102 96 - 106 mmol/L   CO2 25 20 - 29 mmol/L   Calcium 9.8 8.6 - 10.2 mg/dL   Total Protein 7.1 6.0 - 8.5 g/dL   Albumin 4.5 3.7 - 4.7 g/dL   Globulin, Total 2.6 1.5 - 4.5 g/dL   Albumin/Globulin Ratio 1.7 1.2 - 2.2   Bilirubin Total 1.2 0.0 - 1.2 mg/dL   Alkaline Phosphatase 58 39 - 117 IU/L   AST 31 0 - 40 IU/L   ALT 34 0 - 44 IU/L  Lipid Panel w/o Chol/HDL Ratio  Result Value Ref Range   Cholesterol, Total 190 100 - 199 mg/dL   Triglycerides 92 0 - 149 mg/dL   HDL 80 >39 mg/dL   VLDL Cholesterol Cal 16 5 - 40 mg/dL   LDL Chol Calc (NIH) 94 0 - 99 mg/dL  UA/M w/rflx Culture, Routine   Specimen: Urine   URINE  Result Value Ref Range   Specific Gravity, UA 1.010 1.005 - 1.030   pH, UA 6.0 5.0 - 7.5   Color, UA Yellow Yellow   Appearance Ur Clear Clear   Leukocytes,UA Negative Negative   Protein,UA Negative Negative/Trace   Glucose, UA Negative Negative   Ketones, UA Negative Negative   RBC, UA Negative Negative   Bilirubin, UA Negative Negative   Urobilinogen, Ur 0.2 0.2 - 1.0 mg/dL   Nitrite, UA Negative Negative      Assessment & Plan:   Problem List Items Addressed This Visit      Cardiovascular and Mediastinum   Essential hypertension - Primary    BPs stable and WNL, continue current regimen      Relevant Orders   CBC with Differential/Platelet (Completed)    Comprehensive metabolic panel (Completed)   UA/M w/rflx Culture, Routine (Completed)   CAD (coronary artery disease)    Recheck lipids, adjust as needed. Continue current regimen and lifestyle modifications        Other   Hypercholesteremia    Recheck lipids, adjust as  needed. Continue current regimen      Relevant Orders   Lipid Panel w/o Chol/HDL Ratio (Completed)    Other Visit Diagnoses    Annual physical exam       Chronic heel pain, left       Obtain x-ray, follow up with Podiatry if not benefiting from continued supportive care   Relevant Orders   DG Foot Complete Left (Completed)   Chronic elbow pain, right       Continue diclofenac gel, exercises reviewed, discussed PT if no benefit to home exercises given persistence of sxs. Suspect tendonitis       Discussed aspirin prophylaxis for myocardial infarction prevention and decision was made to continue ASA  LABORATORY TESTING:  Health maintenance labs ordered today as discussed above.   The natural history of prostate cancer and ongoing controversy regarding screening and potential treatment outcomes of prostate cancer has been discussed with the patient. The meaning of a false positive PSA and a false negative PSA has been discussed. He indicates understanding of the limitations of this screening test and wishes not to proceed with screening PSA testing.Followed by Urology   IMMUNIZATIONS:   - Tdap: Tetanus vaccination status reviewed: last tetanus booster within 10 years. - Influenza: Up to date - Pneumovax: Given elsewhere - Prevnar: Up to date - HPV: Not applicable - Zostavax vaccine: Up to date  SCREENING: - Colonoscopy: Up to date  Discussed with patient purpose of the colonoscopy is to detect colon cancer at curable precancerous or early stages    PATIENT COUNSELING:    Sexuality: Discussed sexually transmitted diseases, partner selection, use of condoms, avoidance of unintended pregnancy  and  contraceptive alternatives.   Advised to avoid cigarette smoking.  I discussed with the patient that most people either abstain from alcohol or drink within safe limits (<=14/week and <=4 drinks/occasion for males, <=7/weeks and <= 3 drinks/occasion for females) and that the risk for alcohol disorders and other health effects rises proportionally with the number of drinks per week and how often a drinker exceeds daily limits.  Discussed cessation/primary prevention of drug use and availability of treatment for abuse.   Diet: Encouraged to adjust caloric intake to maintain  or achieve ideal body weight, to reduce intake of dietary saturated fat and total fat, to limit sodium intake by avoiding high sodium foods and not adding table salt, and to maintain adequate dietary potassium and calcium preferably from fresh fruits, vegetables, and low-fat dairy products.    stressed the importance of regular exercise  Injury prevention: Discussed safety belts, safety helmets, smoke detector, smoking near bedding or upholstery.   Dental health: Discussed importance of regular tooth brushing, flossing, and dental visits.   Follow up plan: NEXT PREVENTATIVE PHYSICAL DUE IN 1 YEAR. Return in about 6 months (around 12/24/2019) for 6 month f/u.

## 2019-06-24 LAB — CBC WITH DIFFERENTIAL/PLATELET
Basophils Absolute: 0.1 10*3/uL (ref 0.0–0.2)
Basos: 1 %
EOS (ABSOLUTE): 0.2 10*3/uL (ref 0.0–0.4)
Eos: 3 %
Hematocrit: 48.7 % (ref 37.5–51.0)
Hemoglobin: 16 g/dL (ref 13.0–17.7)
Immature Grans (Abs): 0 10*3/uL (ref 0.0–0.1)
Immature Granulocytes: 0 %
Lymphocytes Absolute: 2.3 10*3/uL (ref 0.7–3.1)
Lymphs: 40 %
MCH: 30.2 pg (ref 26.6–33.0)
MCHC: 32.9 g/dL (ref 31.5–35.7)
MCV: 92 fL (ref 79–97)
Monocytes Absolute: 0.5 10*3/uL (ref 0.1–0.9)
Monocytes: 9 %
Neutrophils Absolute: 2.7 10*3/uL (ref 1.4–7.0)
Neutrophils: 47 %
Platelets: 212 10*3/uL (ref 150–450)
RBC: 5.3 x10E6/uL (ref 4.14–5.80)
RDW: 13.1 % (ref 11.6–15.4)
WBC: 5.8 10*3/uL (ref 3.4–10.8)

## 2019-06-24 LAB — COMPREHENSIVE METABOLIC PANEL
ALT: 34 IU/L (ref 0–44)
AST: 31 IU/L (ref 0–40)
Albumin/Globulin Ratio: 1.7 (ref 1.2–2.2)
Albumin: 4.5 g/dL (ref 3.7–4.7)
Alkaline Phosphatase: 58 IU/L (ref 39–117)
BUN/Creatinine Ratio: 10 (ref 10–24)
BUN: 12 mg/dL (ref 8–27)
Bilirubin Total: 1.2 mg/dL (ref 0.0–1.2)
CO2: 25 mmol/L (ref 20–29)
Calcium: 9.8 mg/dL (ref 8.6–10.2)
Chloride: 102 mmol/L (ref 96–106)
Creatinine, Ser: 1.23 mg/dL (ref 0.76–1.27)
GFR calc Af Amer: 68 mL/min/{1.73_m2} (ref >59)
GFR calc non Af Amer: 59 mL/min/{1.73_m2} — ABNORMAL LOW (ref >59)
Globulin, Total: 2.6 g/dL (ref 1.5–4.5)
Glucose: 97 mg/dL (ref 65–99)
Potassium: 4.4 mmol/L (ref 3.5–5.2)
Sodium: 141 mmol/L (ref 134–144)
Total Protein: 7.1 g/dL (ref 6.0–8.5)

## 2019-06-24 LAB — LIPID PANEL W/O CHOL/HDL RATIO
Cholesterol, Total: 190 mg/dL (ref 100–199)
HDL: 80 mg/dL (ref >39)
LDL Chol Calc (NIH): 94 mg/dL (ref 0–99)
Triglycerides: 92 mg/dL (ref 0–149)
VLDL Cholesterol Cal: 16 mg/dL (ref 5–40)

## 2019-06-27 ENCOUNTER — Encounter: Payer: Self-pay | Admitting: Family Medicine

## 2019-06-29 ENCOUNTER — Telehealth: Payer: Self-pay

## 2019-06-29 NOTE — Telephone Encounter (Signed)
Patient informed of pathology results 

## 2019-07-10 NOTE — Assessment & Plan Note (Signed)
Recheck lipids, adjust as needed. Continue current regimen and lifestyle modifications 

## 2019-07-10 NOTE — Assessment & Plan Note (Signed)
Recheck lipids, adjust as needed. Continue current regimen 

## 2019-07-10 NOTE — Assessment & Plan Note (Signed)
BPs stable and WNL, continue current regimen 

## 2019-09-10 ENCOUNTER — Other Ambulatory Visit: Payer: Self-pay | Admitting: Family Medicine

## 2019-09-10 DIAGNOSIS — I2583 Coronary atherosclerosis due to lipid rich plaque: Secondary | ICD-10-CM

## 2019-09-10 DIAGNOSIS — I10 Essential (primary) hypertension: Secondary | ICD-10-CM

## 2019-09-10 DIAGNOSIS — E7800 Pure hypercholesterolemia, unspecified: Secondary | ICD-10-CM

## 2019-09-14 DIAGNOSIS — I25118 Atherosclerotic heart disease of native coronary artery with other forms of angina pectoris: Secondary | ICD-10-CM | POA: Diagnosis not present

## 2019-09-19 DIAGNOSIS — H2513 Age-related nuclear cataract, bilateral: Secondary | ICD-10-CM | POA: Diagnosis not present

## 2019-10-06 ENCOUNTER — Encounter: Payer: Self-pay | Admitting: Family Medicine

## 2019-10-06 DIAGNOSIS — N401 Enlarged prostate with lower urinary tract symptoms: Secondary | ICD-10-CM | POA: Diagnosis not present

## 2019-10-10 ENCOUNTER — Other Ambulatory Visit: Payer: Self-pay | Admitting: Family Medicine

## 2019-10-10 DIAGNOSIS — M25511 Pain in right shoulder: Secondary | ICD-10-CM

## 2019-10-10 DIAGNOSIS — M25521 Pain in right elbow: Secondary | ICD-10-CM

## 2019-10-10 DIAGNOSIS — G8929 Other chronic pain: Secondary | ICD-10-CM

## 2019-10-17 DIAGNOSIS — N5201 Erectile dysfunction due to arterial insufficiency: Secondary | ICD-10-CM | POA: Diagnosis not present

## 2019-10-17 DIAGNOSIS — N401 Enlarged prostate with lower urinary tract symptoms: Secondary | ICD-10-CM | POA: Diagnosis not present

## 2019-10-17 DIAGNOSIS — R972 Elevated prostate specific antigen [PSA]: Secondary | ICD-10-CM | POA: Diagnosis not present

## 2019-10-20 ENCOUNTER — Other Ambulatory Visit: Payer: Self-pay

## 2019-10-20 ENCOUNTER — Encounter: Payer: Self-pay | Admitting: Podiatry

## 2019-10-20 ENCOUNTER — Ambulatory Visit: Payer: Medicare HMO | Admitting: Podiatry

## 2019-10-20 DIAGNOSIS — M7751 Other enthesopathy of right foot: Secondary | ICD-10-CM | POA: Diagnosis not present

## 2019-10-21 ENCOUNTER — Encounter: Payer: Self-pay | Admitting: Podiatry

## 2019-10-21 NOTE — Progress Notes (Signed)
Subjective:  Patient ID: Hector Woodard, male    DOB: [birth year],  MRN: [id number]  Chief Complaint  Patient presents with  . Foot Pain    Patient presents today for bilat foot pain right worse than left off and on x years.    74 y.o. male presents with the above complaint.  Patient presents with right third MPJ pain.  Patient states is been going on for quite some time.  Patient states the right side is much worse on the left side.  Is been going on for years on and off.  Patient states dull achy in nature.  Patient states is more painful when playing pickle ball.  He has tried heel cups and different shoes.  But primarily his pain is to the right third MPJ.  He denies any other acute complaints.  He has not seen anyone else prior to seeing me.  He would like to discuss various treatment options.  His pain scale 7 out of 10.   Review of Systems: Negative except as noted in the HPI. Denies N/V/F/Ch.  Past Medical History:  Diagnosis Date  . Anginal pain ([place])   . Basal cell carcinoma (BCC) of face 06/21/2018   R mid cheek, Mohs 11/04/2018,  L mid forehead, Mohs 11/18/2018  . Hx of dysplastic nevus 09/20/2018   L posterior calf, moderate atypia  . Hypercholesteremia   . Hypertension   . PIN (prostatic intraepithelial neoplasia)   . Vertigo 2015    Current Outpatient Medications:  .  aspirin EC 81 MG tablet, Take 81 mg by mouth daily., Disp: , Rfl:  .  atorvastatin (LIPITOR) 40 MG tablet, TAKE 1 TABLET BY MOUTH EVERY DAY, Disp: 90 tablet, Rfl: 2 .  benazepril (LOTENSIN) 10 MG tablet, TAKE 1 TABLET BY MOUTH EVERY DAY, Disp: 90 tablet, Rfl: 0 .  clopidogrel (PLAVIX) 75 MG tablet, TAKE 1 TABLET BY MOUTH EVERY DAY, Disp: 90 tablet, Rfl: 0 .  dutasteride (AVODART) 0.5 MG capsule, Take by mouth daily. , Disp: , Rfl:  .  Multiple Vitamin (MULTIVITAMIN) capsule, Take 1 capsule by mouth daily., Disp: , Rfl:  .  Omega-3 Fatty Acids (FISH OIL) 1000 MG CAPS, Take 1 capsule by mouth at bedtime.,  Disp: , Rfl:  .  vitamin E 180 MG (400 UNITS) capsule, Take by mouth as needed. , Disp: , Rfl:   Social History   Tobacco Use  Smoking Status Never Smoker  Smokeless Tobacco Never Used    Allergies  Allergen Reactions  . Crestor [Rosuvastatin Calcium] Rash    Had started three new meds, few days later had a rash. Stopped the Crestor and rash is improved. Other 2 meds were continued.   Objective:  There were no vitals filed for this visit. There is no height or weight on file to calculate BMI. Constitutional Well developed. Well nourished.  Vascular Dorsalis pedis pulses palpable bilaterally. Posterior tibial pulses palpable bilaterally. Capillary refill normal to all digits.  No cyanosis or clubbing noted. Pedal hair growth normal.  Neurologic Normal speech. Oriented to person, place, and time. Epicritic sensation to light touch grossly present bilaterally.  Dermatologic Nails well groomed and normal in appearance. No open wounds. No skin lesions.  Orthopedic:  Pain on palpation of the right third metatarsophalangeal joint.  Pain with range of motion of third metatarsophalangeal joint active and passive.  No Mulder's click in any of the interspaces.  No signs of neuroma or [place] Canal sign.  No intra-articular pain of the  third MTPJ   Radiographs: None Assessment:   1. Capsulitis of metatarsophalangeal (MTP) joint of right foot    Plan:  Patient was evaluated and treated and all questions answered.  Right third metatarsophalangeal joint capsulitis -I explained to the patient the etiology of capsulitis and various treatment options were extensively discussed.  I believe patient would benefit from a steroid injection help decrease the acute inflammatory component associated pain.  Patient agrees with plan like to proceed with a steroid injection. -Ultimately patient will benefit from metatarsal pads and possible orthotics. -Metatarsal pads were dispensed -Surgical shoe was  dispensed to prevent dorsiflexion of the third MTPJ joint on the right.   No follow-ups on file.

## 2019-11-07 DIAGNOSIS — M25521 Pain in right elbow: Secondary | ICD-10-CM | POA: Diagnosis not present

## 2019-11-07 DIAGNOSIS — M25511 Pain in right shoulder: Secondary | ICD-10-CM | POA: Diagnosis not present

## 2019-11-16 DIAGNOSIS — Z03818 Encounter for observation for suspected exposure to other biological agents ruled out: Secondary | ICD-10-CM | POA: Diagnosis not present

## 2019-11-16 DIAGNOSIS — Z20822 Contact with and (suspected) exposure to covid-19: Secondary | ICD-10-CM | POA: Diagnosis not present

## 2019-11-29 ENCOUNTER — Ambulatory Visit (INDEPENDENT_AMBULATORY_CARE_PROVIDER_SITE_OTHER): Payer: Medicare HMO | Admitting: Podiatry

## 2019-11-29 ENCOUNTER — Other Ambulatory Visit: Payer: Self-pay

## 2019-11-29 ENCOUNTER — Encounter: Payer: Self-pay | Admitting: Podiatry

## 2019-11-29 DIAGNOSIS — M7751 Other enthesopathy of right foot: Secondary | ICD-10-CM

## 2019-11-29 DIAGNOSIS — Q666 Other congenital valgus deformities of feet: Secondary | ICD-10-CM | POA: Diagnosis not present

## 2019-11-30 ENCOUNTER — Encounter: Payer: Self-pay | Admitting: Podiatry

## 2019-11-30 NOTE — Progress Notes (Signed)
Subjective:  Patient ID: Hector Woodard, male    DOB: Oct 16, 1945,  MRN: [id number]  Chief Complaint  Patient presents with  . Foot Pain    follow up right foot   "its a little better, but still hurts"    74 y.o. male presents with the above complaint.  Patient presents with follow-up of right third metatarsophalangeal joint capsulitis.  Patient states got a little bit better however the injection did not last long.  He got more relief from metatarsal pads.  He wants to discuss orthotics at this time.  He denies any other acute complaints.   Review of Systems: Negative except as noted in the HPI. Denies N/V/F/Ch.  Past Medical History:  Diagnosis Date  . Anginal pain ([place])   . Basal cell carcinoma (BCC) of face 06/21/2018   R mid cheek, Mohs 11/04/2018,  L mid forehead, Mohs 11/18/2018  . Hx of dysplastic nevus 09/20/2018   L posterior calf, moderate atypia  . Hypercholesteremia   . Hypertension   . PIN (prostatic intraepithelial neoplasia)   . Vertigo 2015    Current Outpatient Medications:  .  aspirin EC 81 MG tablet, Take 81 mg by mouth daily., Disp: , Rfl:  .  atorvastatin (LIPITOR) 40 MG tablet, TAKE 1 TABLET BY MOUTH EVERY DAY, Disp: 90 tablet, Rfl: 2 .  benazepril (LOTENSIN) 10 MG tablet, TAKE 1 TABLET BY MOUTH EVERY DAY, Disp: 90 tablet, Rfl: 0 .  clopidogrel (PLAVIX) 75 MG tablet, TAKE 1 TABLET BY MOUTH EVERY DAY, Disp: 90 tablet, Rfl: 0 .  dutasteride (AVODART) 0.5 MG capsule, Take by mouth daily. , Disp: , Rfl:  .  Multiple Vitamin (MULTIVITAMIN) capsule, Take 1 capsule by mouth daily., Disp: , Rfl:  .  Omega-3 Fatty Acids (FISH OIL) 1000 MG CAPS, Take 1 capsule by mouth at bedtime., Disp: , Rfl:  .  vitamin E 180 MG (400 UNITS) capsule, Take by mouth as needed. , Disp: , Rfl:   Social History   Tobacco Use  Smoking Status Never Smoker  Smokeless Tobacco Never Used    Allergies  Allergen Reactions  . Crestor [Rosuvastatin Calcium] Rash    Had started three new  meds, few days later had a rash. Stopped the Crestor and rash is improved. Other 2 meds were continued.   Objective:  There were no vitals filed for this visit. There is no height or weight on file to calculate BMI. Constitutional Well developed. Well nourished.  Vascular Dorsalis pedis pulses palpable bilaterally. Posterior tibial pulses palpable bilaterally. Capillary refill normal to all digits.  No cyanosis or clubbing noted. Pedal hair growth normal.  Neurologic Normal speech. Oriented to person, place, and time. Epicritic sensation to light touch grossly present bilaterally.  Dermatologic Nails well groomed and normal in appearance. No open wounds. No skin lesions.  Orthopedic:  Pain on palpation of the right third metatarsophalangeal joint.  Pain with range of motion of third metatarsophalangeal joint active and passive.  No Mulder's click in any of the interspaces.  No signs of neuroma or [place] sign.  No intra-articular pain of the third MTPJ   Radiographs: None Assessment:   No diagnosis found. Plan:  Patient was evaluated and treated and all questions answered.  Right third metatarsophalangeal joint capsulitis -Clinically patient is getting more benefit from the metatarsal pads and therefore I believe patient will benefit from custom-made orthotics to help offload with incorporation of the metatarsal tarsal pad to bilateral orthotics.  Pes planovalgus -I explained  to patient the etiology of pes planovalgus and various treatment options were extensively discussed with the patient.  I believe he will benefit from custom-made orthotics with incorporation of the metatarsal pads to bilateral side to take the pressure off of the submetatarsal third MPJ joint. -I will have him follow-up with Liliane Channel for custom-made orthotics to help support the arches of the foot as well as control the hindfoot motion.   No follow-ups on file.

## 2019-12-04 ENCOUNTER — Other Ambulatory Visit: Payer: Self-pay | Admitting: Family Medicine

## 2019-12-04 DIAGNOSIS — I10 Essential (primary) hypertension: Secondary | ICD-10-CM

## 2019-12-04 DIAGNOSIS — I2583 Coronary atherosclerosis due to lipid rich plaque: Secondary | ICD-10-CM

## 2019-12-08 ENCOUNTER — Encounter: Payer: Self-pay | Admitting: Dermatology

## 2019-12-08 ENCOUNTER — Telehealth: Payer: Self-pay | Admitting: Family Medicine

## 2019-12-08 NOTE — Telephone Encounter (Signed)
Patient declined the Medicare Wellness Visit with NHA - does not want completed by phone.   He stated that he was told through [place] message that he could have his FLU shot at the time of his [facility] appointment  that was scheduled for Sept 10, 2021. I apologized for the misunderstanding then explained that it was not scheduled to be done in office and the importance of having this completed. That unfortunately at this time, we do not have a NHA who is in the office and that is why we were having to offer it to be done by phone.

## 2019-12-09 ENCOUNTER — Other Ambulatory Visit: Payer: Self-pay

## 2019-12-09 ENCOUNTER — Ambulatory Visit (INDEPENDENT_AMBULATORY_CARE_PROVIDER_SITE_OTHER): Payer: Medicare HMO

## 2019-12-09 ENCOUNTER — Ambulatory Visit: Payer: Medicare HMO

## 2019-12-09 DIAGNOSIS — Z23 Encounter for immunization: Secondary | ICD-10-CM | POA: Diagnosis not present

## 2019-12-14 ENCOUNTER — Ambulatory Visit (INDEPENDENT_AMBULATORY_CARE_PROVIDER_SITE_OTHER): Payer: Medicare HMO | Admitting: Orthotics

## 2019-12-14 ENCOUNTER — Other Ambulatory Visit: Payer: Self-pay

## 2019-12-14 DIAGNOSIS — M7751 Other enthesopathy of right foot: Secondary | ICD-10-CM

## 2019-12-14 NOTE — Progress Notes (Signed)
REFURBISH Good Feet f/o; with 2 offload RT

## 2019-12-22 ENCOUNTER — Other Ambulatory Visit: Payer: Self-pay

## 2019-12-22 ENCOUNTER — Ambulatory Visit: Payer: Medicare HMO | Admitting: Dermatology

## 2019-12-22 ENCOUNTER — Encounter: Payer: Self-pay | Admitting: Dermatology

## 2019-12-22 DIAGNOSIS — L578 Other skin changes due to chronic exposure to nonionizing radiation: Secondary | ICD-10-CM | POA: Diagnosis not present

## 2019-12-22 DIAGNOSIS — Z86018 Personal history of other benign neoplasm: Secondary | ICD-10-CM

## 2019-12-22 DIAGNOSIS — D485 Neoplasm of uncertain behavior of skin: Secondary | ICD-10-CM | POA: Diagnosis not present

## 2019-12-22 DIAGNOSIS — L820 Inflamed seborrheic keratosis: Secondary | ICD-10-CM | POA: Diagnosis not present

## 2019-12-22 DIAGNOSIS — L821 Other seborrheic keratosis: Secondary | ICD-10-CM | POA: Diagnosis not present

## 2019-12-22 DIAGNOSIS — L814 Other melanin hyperpigmentation: Secondary | ICD-10-CM

## 2019-12-22 NOTE — Progress Notes (Signed)
° °  Follow-Up Visit   Subjective  Hector Woodard is a 74 y.o. male who presents for the following: growth (R post auricular, >20 yrs hx of bleeding, hit by barber). He has history of dysplastic nevus of the calf.  He has other areas to be checked.  The following portions of the chart were reviewed this encounter and updated as appropriate:  Tobacco   Allergies   Meds   Problems   Med Hx   Surg Hx   Fam Hx      Review of Systems:  No other skin or systemic complaints except as noted in HPI or Assessment and Plan.  Objective  Well appearing patient in no apparent distress; mood and affect are within normal limits.  A focused examination was performed including scalp/neck. Relevant physical exam findings are noted in the Assessment and Plan.  Objective  Right Postauricular Sulcus: 2.5 cm hyperkeratotic papule  Objective  Left calf: Well healed biopsy site   Assessment & Plan    Actinic Damage - diffuse scaly erythematous macules with underlying dyspigmentation - Recommend daily broad spectrum sunscreen SPF 30+ to sun-exposed areas, reapply every 2 hours as needed.  - Call for new or changing lesions.  Lentigines - Scattered tan macules - Discussed due to sun exposure - Benign, observe - Call for any changes  Seborrheic Keratoses - Stuck-on, waxy, tan-brown papules and plaques  - Discussed benign etiology and prognosis. - Observe - Call for any changes  Neoplasm of uncertain behavior of skin Right Postauricular Sulcus  Epidermal / dermal shaving  Lesion diameter (cm):  2.5 Informed consent: discussed and consent obtained   Timeout: patient name, date of birth, surgical site, and procedure verified   Procedure prep:  Patient was prepped and draped in usual sterile fashion Prep type:  Isopropyl alcohol Anesthesia: the lesion was anesthetized in a standard fashion   Anesthetic:  1% lidocaine w/ epinephrine 1-100,000 buffered w/ 8.4% NaHCO3 Instrument used: flexible  razor blade   Hemostasis achieved with: pressure, aluminum chloride and electrodesiccation   Outcome: patient tolerated procedure well   Post-procedure details: sterile dressing applied and wound care instructions given   Dressing type: bandage and petrolatum    Specimen 1 - Surgical pathology Differential Diagnosis: SK vs SCC vs other  Check Margins: No 2.5 cm hyperkeratotic papule  History of dysplastic nevus Left calf  Clear. Observe for recurrence. Call clinic for new or changing lesions.  Recommend regular skin exams, daily broad-spectrum spf 30+ sunscreen use, and photoprotection.     Return in about 6 months (around 06/20/2020) for TBSE.   I, Ashok Cordia, CMA, am acting as scribe for Sarina Ser, MD .  Documentation: I have reviewed the above documentation for accuracy and completeness, and I agree with the above.  Sarina Ser, MD

## 2019-12-22 NOTE — Patient Instructions (Signed)

## 2019-12-28 ENCOUNTER — Ambulatory Visit: Payer: Medicare HMO | Admitting: Family Medicine

## 2019-12-28 ENCOUNTER — Ambulatory Visit: Payer: Medicare HMO | Admitting: Nurse Practitioner

## 2019-12-28 ENCOUNTER — Other Ambulatory Visit: Payer: Self-pay | Admitting: Family Medicine

## 2019-12-28 DIAGNOSIS — I10 Essential (primary) hypertension: Secondary | ICD-10-CM

## 2019-12-28 NOTE — Telephone Encounter (Signed)
Pt has apt on 12/29/2019

## 2019-12-28 NOTE — Telephone Encounter (Signed)
Requested medication (s) are due for refill today:yes  Requested medication (s) are on the active medication list: yes  Last refill: 12/04/19  #30  0 refills  Future visit scheduled: yes tomorrow   Notes to clinic: Has not been seen by new provider Kathrine Haddock    Requested Prescriptions  Pending Prescriptions Disp Refills   benazepril (LOTENSIN) 10 MG tablet [Pharmacy Med Name: BENAZEPRIL HCL 10 MG TABLET] 30 tablet 0    Sig: TAKE 1 TABLET BY MOUTH EVERY DAY      Cardiovascular:  ACE Inhibitors Failed - 12/28/2019 11:34 AM      Failed - Cr in normal range and within 180 days    Creatinine, Ser  Date Value Ref Range Status  06/23/2019 1.23 0.76 - 1.27 mg/dL Final          Failed - K in normal range and within 180 days    Potassium  Date Value Ref Range Status  06/23/2019 4.4 3.5 - 5.2 mmol/L Final          Failed - Valid encounter within last 6 months    Recent Outpatient Visits           6 months ago Essential hypertension   [facility], [place]   1 year ago Essential hypertension   [facility] [place], Jeannette How, [place]   1 year ago Essential hypertension   [facility] [place], Jeannette How, MD   2 years ago Essential hypertension   [facility] [place], Jeannette How, MD   2 years ago Essential hypertension   [place], Jeannette How, [place]       Future Appointments             Tomorrow Kathrine Haddock, NP [place], [place] - Patient is not pregnant      Passed - Last BP in normal range    BP Readings from Last 1 Encounters:  06/23/19 137/75

## 2019-12-29 ENCOUNTER — Telehealth: Payer: Self-pay

## 2019-12-29 ENCOUNTER — Ambulatory Visit (INDEPENDENT_AMBULATORY_CARE_PROVIDER_SITE_OTHER): Payer: Medicare HMO | Admitting: Unknown Physician Specialty

## 2019-12-29 ENCOUNTER — Other Ambulatory Visit: Payer: Self-pay

## 2019-12-29 ENCOUNTER — Encounter: Payer: Self-pay | Admitting: Unknown Physician Specialty

## 2019-12-29 DIAGNOSIS — E7800 Pure hypercholesterolemia, unspecified: Secondary | ICD-10-CM | POA: Diagnosis not present

## 2019-12-29 DIAGNOSIS — N1831 Chronic kidney disease, stage 3a: Secondary | ICD-10-CM

## 2019-12-29 DIAGNOSIS — I10 Essential (primary) hypertension: Secondary | ICD-10-CM | POA: Diagnosis not present

## 2019-12-29 DIAGNOSIS — N183 Chronic kidney disease, stage 3 unspecified: Secondary | ICD-10-CM | POA: Insufficient documentation

## 2019-12-29 MED ORDER — BENAZEPRIL HCL 10 MG PO TABS: 10.0000 mg | ORAL_TABLET | Freq: Every day | ORAL | 1 refills | Status: DC

## 2019-12-29 NOTE — Assessment & Plan Note (Signed)
Check GFR today.  This has been stable for several years

## 2019-12-29 NOTE — Assessment & Plan Note (Signed)
Not to goal of SBP  less than 130 on a .  Pt reluctant to go up on medication.  Agreed to keep a log.  Recheck in 6 months

## 2019-12-29 NOTE — Telephone Encounter (Signed)
Patient informed of pathology results 

## 2019-12-29 NOTE — Progress Notes (Signed)
BP 135/75 (BP Location: Left Arm, Patient Position: Sitting, Cuff Size: Normal)   Pulse 75   Temp 97.8 F (36.6 C) (Oral)   Ht 5\' 11"  (1.803 m)   Wt 191 lb 6.4 oz (86.8 kg)   SpO2 97%   BMI 26.69 kg/m    Subjective:    Patient ID: Hector Woodard, male    DOB: 05-Oct-1945, 74 y.o.   MRN: [id number]  HPI: HELMER DULL is a 74 y.o. male  Chief Complaint  Patient presents with  . Hypertension   Hypertension Using medications without difficulty Average home SB{ 119-mid 130s   No problems or lightheadedness No chest pain with exertion or shortness of breath No Edema   Hyperlipidemia Using medications without problems: No Muscle aches  Diet compliance: States heats a well balanced diet.   Exercise: Not exercising quite as much  Stage 3 CKD Avoids NSAIDS.     Relevant past medical, surgical, family and social history reviewed and updated as indicated. Interim medical history since our last visit reviewed. Allergies and medications reviewed and updated.  Review of Systems  Per HPI unless specifically indicated above     Objective:    BP 135/75 (BP Location: Left Arm, Patient Position: Sitting, Cuff Size: Normal)   Pulse 75   Temp 97.8 F (36.6 C) (Oral)   Ht 5\' 11"  (1.803 m)   Wt 191 lb 6.4 oz (86.8 kg)   SpO2 97%   BMI 26.69 kg/m   Wt Readings from Last 3 Encounters:  12/29/19 191 lb 6.4 oz (86.8 kg)  06/23/19 187 lb (84.8 kg)  06/03/18 187 lb 6 oz (85 kg)    Physical Exam Constitutional:      General: He is not in acute distress.    Appearance: Normal appearance. He is well-developed.  HENT:     Head: Normocephalic and atraumatic.  Eyes:     General: Lids are normal. No scleral icterus.       Right eye: No discharge.        Left eye: No discharge.     Conjunctiva/sclera: Conjunctivae normal.  Neck:     Vascular: No carotid bruit or JVD.  Cardiovascular:     Rate and Rhythm: Normal rate and regular rhythm.     Heart sounds: Normal heart  sounds.  Pulmonary:     Effort: Pulmonary effort is normal. No respiratory distress.     Breath sounds: Normal breath sounds.  Abdominal:     Palpations: There is no hepatomegaly or splenomegaly.  Musculoskeletal:        General: Normal range of motion.     Cervical back: Normal range of motion and neck supple.  Skin:    General: Skin is warm and dry.     Coloration: Skin is not pale.     Findings: No rash.  Neurological:     Mental Status: He is alert and oriented to person, place, and time.  Psychiatric:        Behavior: Behavior normal.        Thought Content: Thought content normal.        Judgment: Judgment normal.       Assessment & Plan:   Problem List Items Addressed This Visit      Unprioritized   Chronic kidney disease, stage 3 unspecified (HCC)    Check GFR today.  This has been stable for several years      Relevant Orders   Comprehensive metabolic panel  Essential hypertension    Not to goal of SBP  less than 130 on a .  Pt reluctant to go up on medication.  Agreed to keep a log.  Recheck in 6 months      Relevant Medications   benazepril (LOTENSIN) 10 MG tablet   Hypercholesteremia    Last lipid panel shows LDL below 100.        Relevant Medications   benazepril (LOTENSIN) 10 MG tablet       Follow up plan: Return in about 6 months (around 06/27/2020).

## 2019-12-29 NOTE — Telephone Encounter (Signed)
-----   Message from Ralene Bathe, MD sent at 12/28/2019 12:34 PM EDT ----- Skin (M), right postauricular sulcus SEBORRHEIC KERATOSIS, IRRITATED, CRUSTED  Benign irritated keratosis No further treatment needed Recheck next visit

## 2019-12-29 NOTE — Assessment & Plan Note (Signed)
Last lipid panel shows LDL below 100.

## 2019-12-30 LAB — COMPREHENSIVE METABOLIC PANEL
ALT: 21 IU/L (ref 0–44)
AST: 25 IU/L (ref 0–40)
Albumin/Globulin Ratio: 2.1 (ref 1.2–2.2)
Albumin: 4.6 g/dL (ref 3.7–4.7)
Alkaline Phosphatase: 50 IU/L (ref 44–121)
BUN/Creatinine Ratio: 13 (ref 10–24)
BUN: 17 mg/dL (ref 8–27)
Bilirubin Total: 0.7 mg/dL (ref 0.0–1.2)
CO2: 23 mmol/L (ref 20–29)
Calcium: 9.7 mg/dL (ref 8.6–10.2)
Chloride: 106 mmol/L (ref 96–106)
Creatinine, Ser: 1.26 mg/dL (ref 0.76–1.27)
GFR calc Af Amer: 66 mL/min/{1.73_m2} (ref >59)
GFR calc non Af Amer: 57 mL/min/{1.73_m2} — ABNORMAL LOW (ref >59)
Globulin, Total: 2.2 g/dL (ref 1.5–4.5)
Glucose: 92 mg/dL (ref 65–99)
Potassium: 4.7 mmol/L (ref 3.5–5.2)
Sodium: 144 mmol/L (ref 134–144)
Total Protein: 6.8 g/dL (ref 6.0–8.5)

## 2020-01-19 DIAGNOSIS — M25532 Pain in left wrist: Secondary | ICD-10-CM | POA: Diagnosis not present

## 2020-01-19 DIAGNOSIS — S6992XA Unspecified injury of left wrist, hand and finger(s), initial encounter: Secondary | ICD-10-CM | POA: Diagnosis not present

## 2020-02-07 DIAGNOSIS — Z7189 Other specified counseling: Secondary | ICD-10-CM | POA: Diagnosis not present

## 2020-02-07 DIAGNOSIS — Z20828 Contact with and (suspected) exposure to other viral communicable diseases: Secondary | ICD-10-CM | POA: Diagnosis not present

## 2020-05-30 ENCOUNTER — Other Ambulatory Visit: Payer: Self-pay | Admitting: Family Medicine

## 2020-05-30 DIAGNOSIS — E7800 Pure hypercholesterolemia, unspecified: Secondary | ICD-10-CM

## 2020-05-30 DIAGNOSIS — I2510 Atherosclerotic heart disease of native coronary artery without angina pectoris: Secondary | ICD-10-CM

## 2020-05-30 NOTE — Telephone Encounter (Signed)
Patient is due for appointment.  Can we get him scheduled, please?

## 2020-05-30 NOTE — Telephone Encounter (Signed)
Requested medications are due for refill today yes  Requested medications are on the active medication list yes  Last refill 12/8  Last visit 11/2019, OV note states return 6 months  Future visit scheduled Not until 08/2020  Notes to clinic Failed protocol due to no valid labs are more than 70 days old.  Visit scheduled for June 2022, Please assess.

## 2020-06-28 ENCOUNTER — Other Ambulatory Visit: Payer: Self-pay | Admitting: Nurse Practitioner

## 2020-06-28 DIAGNOSIS — I10 Essential (primary) hypertension: Secondary | ICD-10-CM

## 2020-06-28 MED ORDER — BENAZEPRIL HCL 10 MG PO TABS: 10.0000 mg | ORAL_TABLET | Freq: Every day | ORAL | 1 refills | Status: DC

## 2020-09-08 ENCOUNTER — Encounter: Payer: Self-pay | Admitting: Nurse Practitioner

## 2020-09-12 ENCOUNTER — Other Ambulatory Visit: Payer: Self-pay

## 2020-09-12 ENCOUNTER — Ambulatory Visit (INDEPENDENT_AMBULATORY_CARE_PROVIDER_SITE_OTHER): Payer: Medicare HMO | Admitting: Internal Medicine

## 2020-09-12 ENCOUNTER — Encounter: Payer: Self-pay | Admitting: Internal Medicine

## 2020-09-12 VITALS — BP 143/90 | HR 59 | Temp 98.7°F | Ht 71.46 in | Wt 190.4 lb

## 2020-09-12 DIAGNOSIS — I10 Essential (primary) hypertension: Secondary | ICD-10-CM | POA: Diagnosis not present

## 2020-09-12 DIAGNOSIS — N401 Enlarged prostate with lower urinary tract symptoms: Secondary | ICD-10-CM

## 2020-09-12 DIAGNOSIS — Z23 Encounter for immunization: Secondary | ICD-10-CM | POA: Diagnosis not present

## 2020-09-12 DIAGNOSIS — N1831 Chronic kidney disease, stage 3a: Secondary | ICD-10-CM

## 2020-09-12 DIAGNOSIS — R350 Frequency of micturition: Secondary | ICD-10-CM | POA: Diagnosis not present

## 2020-09-12 DIAGNOSIS — I2583 Coronary atherosclerosis due to lipid rich plaque: Secondary | ICD-10-CM | POA: Diagnosis not present

## 2020-09-12 DIAGNOSIS — I2510 Atherosclerotic heart disease of native coronary artery without angina pectoris: Secondary | ICD-10-CM | POA: Diagnosis not present

## 2020-09-12 DIAGNOSIS — E7800 Pure hypercholesterolemia, unspecified: Secondary | ICD-10-CM

## 2020-09-12 LAB — URINALYSIS, ROUTINE W REFLEX MICROSCOPIC
Bilirubin, UA: NEGATIVE
Glucose, UA: NEGATIVE
Ketones, UA: NEGATIVE
Leukocytes,UA: NEGATIVE
Nitrite, UA: NEGATIVE
Protein,UA: NEGATIVE
Specific Gravity, UA: 1.025 (ref 1.005–1.030)
Urobilinogen, Ur: 0.2 mg/dL (ref 0.2–1.0)
pH, UA: 6 (ref 5.0–7.5)

## 2020-09-12 LAB — MICROSCOPIC EXAMINATION
Bacteria, UA: NONE SEEN
Epithelial Cells (non renal): NONE SEEN /hpf (ref 0–10)
WBC, UA: NONE SEEN /hpf (ref 0–5)

## 2020-09-12 NOTE — Progress Notes (Signed)
BP (!) 143/90   Pulse (!) 59   Temp 98.7 F (37.1 C) (Oral)   Ht 5' 11.46" (1.815 m)   Wt 190 lb 6.4 oz (86.4 kg)   SpO2 99%   BMI 26.22 kg/m    Subjective:    Patient ID: Hector Woodard, male    DOB: 11-04-1945, 75 y.o.   MRN: [id number]  Chief Complaint  Patient presents with   Hypertension   Hyperlipidemia   Coronary Artery Disease   Chronic Kidney Disease    HPI: Hector Woodard is a 75 y.o. male  Pt is here to establish care.   Hypertension This is a chronic problem. Pertinent negatives include no anxiety, blurred vision, chest pain, headaches, malaise/fatigue, neck pain, orthopnea, palpitations, peripheral edema, PND, shortness of breath or sweats.  Hyperlipidemia This is a chronic problem. Pertinent negatives include no chest pain or shortness of breath.  Coronary Artery Disease Presents for follow-up visit. Pertinent negatives include no chest pain, palpitations or shortness of breath. Risk factors include hyperlipidemia and hypertension.   Chief Complaint  Patient presents with   Hypertension   Hyperlipidemia   Coronary Artery Disease   Chronic Kidney Disease    Relevant past medical, surgical, family and social history reviewed and updated as indicated. Interim medical history since our last visit reviewed. Allergies and medications reviewed and updated.  Review of Systems  Constitutional:  Negative for malaise/fatigue.  Eyes:  Negative for blurred vision.  Respiratory:  Negative for shortness of breath.   Cardiovascular:  Negative for chest pain, palpitations, orthopnea and PND.  Musculoskeletal:  Negative for neck pain.  Neurological:  Negative for headaches.   Per HPI unless specifically indicated above     Objective:    BP (!) 143/90   Pulse (!) 59   Temp 98.7 F (37.1 C) (Oral)   Ht 5' 11.46" (1.815 m)   Wt 190 lb 6.4 oz (86.4 kg)   SpO2 99%   BMI 26.22 kg/m   Wt Readings from Last 3 Encounters:  09/12/20 190 lb 6.4 oz (86.4 kg)   12/29/19 191 lb 6.4 oz (86.8 kg)  06/23/19 187 lb (84.8 kg)    Physical Exam  Results for orders placed or performed in visit on 12/29/19  Comprehensive metabolic panel  Result Value Ref Range   Glucose 92 65 - 99 mg/dL   BUN 17 8 - 27 mg/dL   Creatinine, Ser 1.26 0.76 - 1.27 mg/dL   GFR calc non Af Amer 57 (L) >59 mL/min/1.73   GFR calc Af Amer 66 >59 mL/min/1.73   BUN/Creatinine Ratio 13 10 - 24   Sodium 144 134 - 144 mmol/L   Potassium 4.7 3.5 - 5.2 mmol/L   Chloride 106 96 - 106 mmol/L   CO2 23 20 - 29 mmol/L   Calcium 9.7 8.6 - 10.2 mg/dL   Total Protein 6.8 6.0 - 8.5 g/dL   Albumin 4.6 3.7 - 4.7 g/dL   Globulin, Total 2.2 1.5 - 4.5 g/dL   Albumin/Globulin Ratio 2.1 1.2 - 2.2   Bilirubin Total 0.7 0.0 - 1.2 mg/dL   Alkaline Phosphatase 50 44 - 121 IU/L   AST 25 0 - 40 IU/L   ALT 21 0 - 44 IU/L        Current Outpatient Medications:    aspirin EC 81 MG tablet, Take 81 mg by mouth daily., Disp: , Rfl:    atorvastatin (LIPITOR) 40 MG tablet, TAKE 1 TABLET BY MOUTH EVERY  DAY, Disp: 90 tablet, Rfl: 2   benazepril (LOTENSIN) 10 MG tablet, Take 1 tablet (10 mg total) by mouth daily., Disp: 90 tablet, Rfl: 1   clopidogrel (PLAVIX) 75 MG tablet, TAKE 1 TABLET BY MOUTH EVERY DAY, Disp: 90 tablet, Rfl: 1   dutasteride (AVODART) 0.5 MG capsule, Take by mouth daily. , Disp: , Rfl:    Multiple Vitamin (MULTIVITAMIN) capsule, Take 1 capsule by mouth daily., Disp: , Rfl:    Omega-3 Fatty Acids (FISH OIL) 1000 MG CAPS, Take 1 capsule by mouth at bedtime., Disp: , Rfl:    vitamin E 180 MG (400 UNITS) capsule, Take by mouth as needed. , Disp: , Rfl:     Assessment & Plan:  Cad s/p stenting x 2016  Plavix and ASA 81 mg daily  Dr. Ubaldo Glassing @ [facility]. Sees [facility] evry 2 years had a stress test then  Has a large bruise on the left ar,m   HTN : stable , is on benzapril for such  Continue current meds.  Medication compliance emphasised. pt advised to keep Bp logs. Pt verbalised  understanding of the same. Pt to have a low salt diet . Exercise to reach a goal of at least 150 mins a week.  lifestyle modifications explained and pt understands importance of the above.   HLD recheck FLP, check LFT's work on diet, SE of meds explained to pt. low fat and high fiber diet explained to pt.  BCC sees derm for such   Elevated Psdone this year sees urology  Ho BPH is on avodart for such   Requests shingrex vaccine first dose given today next dos 2-7 months after today  Problem List Items Addressed This Visit   None    Follow up plan: No follow-ups on file.  Health Maintenance

## 2020-09-13 LAB — COMPREHENSIVE METABOLIC PANEL
ALT: 21 IU/L (ref 0–44)
AST: 25 IU/L (ref 0–40)
Albumin/Globulin Ratio: 2 (ref 1.2–2.2)
Albumin: 4.7 g/dL (ref 3.7–4.7)
Alkaline Phosphatase: 51 IU/L (ref 44–121)
BUN/Creatinine Ratio: 14 (ref 10–24)
BUN: 18 mg/dL (ref 8–27)
Bilirubin Total: 0.5 mg/dL (ref 0.0–1.2)
CO2: 22 mmol/L (ref 20–29)
Calcium: 9.6 mg/dL (ref 8.6–10.2)
Chloride: 103 mmol/L (ref 96–106)
Creatinine, Ser: 1.31 mg/dL — ABNORMAL HIGH (ref 0.76–1.27)
Globulin, Total: 2.4 g/dL (ref 1.5–4.5)
Glucose: 95 mg/dL (ref 65–99)
Potassium: 4.4 mmol/L (ref 3.5–5.2)
Sodium: 141 mmol/L (ref 134–144)
Total Protein: 7.1 g/dL (ref 6.0–8.5)
eGFR: 58 mL/min/{1.73_m2} — ABNORMAL LOW (ref >59)

## 2020-09-13 LAB — CBC WITH DIFFERENTIAL/PLATELET
Basophils Absolute: 0.1 10*3/uL (ref 0.0–0.2)
Basos: 1 %
EOS (ABSOLUTE): 0.1 10*3/uL (ref 0.0–0.4)
Eos: 2 %
Hematocrit: 49 % (ref 37.5–51.0)
Hemoglobin: 15.8 g/dL (ref 13.0–17.7)
Immature Grans (Abs): 0 10*3/uL (ref 0.0–0.1)
Immature Granulocytes: 0 %
Lymphocytes Absolute: 2.4 10*3/uL (ref 0.7–3.1)
Lymphs: 41 %
MCH: 29.9 pg (ref 26.6–33.0)
MCHC: 32.2 g/dL (ref 31.5–35.7)
MCV: 93 fL (ref 79–97)
Monocytes Absolute: 0.5 10*3/uL (ref 0.1–0.9)
Monocytes: 9 %
Neutrophils Absolute: 2.7 10*3/uL (ref 1.4–7.0)
Neutrophils: 47 %
Platelets: 229 10*3/uL (ref 150–450)
RBC: 5.29 x10E6/uL (ref 4.14–5.80)
RDW: 12.6 % (ref 11.6–15.4)
WBC: 5.9 10*3/uL (ref 3.4–10.8)

## 2020-09-13 LAB — LIPID PANEL
Chol/HDL Ratio: 2.3 ratio (ref 0.0–5.0)
Cholesterol, Total: 176 mg/dL (ref 100–199)
HDL: 77 mg/dL (ref >39)
LDL Chol Calc (NIH): 86 mg/dL (ref 0–99)
Triglycerides: 70 mg/dL (ref 0–149)
VLDL Cholesterol Cal: 13 mg/dL (ref 5–40)

## 2020-09-13 LAB — TSH: TSH: 3.87 u[IU]/mL (ref 0.450–4.500)

## 2020-09-19 ENCOUNTER — Ambulatory Visit: Payer: Medicare HMO | Admitting: Dermatology

## 2020-09-19 ENCOUNTER — Other Ambulatory Visit: Payer: Self-pay

## 2020-09-19 DIAGNOSIS — Z1283 Encounter for screening for malignant neoplasm of skin: Secondary | ICD-10-CM

## 2020-09-19 DIAGNOSIS — Z85828 Personal history of other malignant neoplasm of skin: Secondary | ICD-10-CM | POA: Diagnosis not present

## 2020-09-19 DIAGNOSIS — D229 Melanocytic nevi, unspecified: Secondary | ICD-10-CM

## 2020-09-19 DIAGNOSIS — L820 Inflamed seborrheic keratosis: Secondary | ICD-10-CM

## 2020-09-19 DIAGNOSIS — L821 Other seborrheic keratosis: Secondary | ICD-10-CM

## 2020-09-19 DIAGNOSIS — L814 Other melanin hyperpigmentation: Secondary | ICD-10-CM | POA: Diagnosis not present

## 2020-09-19 DIAGNOSIS — D1800 Hemangioma unspecified site: Secondary | ICD-10-CM

## 2020-09-19 DIAGNOSIS — Z86018 Personal history of other benign neoplasm: Secondary | ICD-10-CM

## 2020-09-19 DIAGNOSIS — T1490XA Injury, unspecified, initial encounter: Secondary | ICD-10-CM | POA: Diagnosis not present

## 2020-09-19 DIAGNOSIS — L570 Actinic keratosis: Secondary | ICD-10-CM | POA: Diagnosis not present

## 2020-09-19 DIAGNOSIS — L578 Other skin changes due to chronic exposure to nonionizing radiation: Secondary | ICD-10-CM

## 2020-09-19 DIAGNOSIS — D692 Other nonthrombocytopenic purpura: Secondary | ICD-10-CM

## 2020-09-19 NOTE — Patient Instructions (Signed)

## 2020-09-19 NOTE — Progress Notes (Signed)
Follow-Up Visit   Subjective  Hector Woodard is a 75 y.o. male who presents for the following: Total body skin exam (Hx of BCC R mid cheek, hx of Dysplastic Nevus L post calf). The patient presents for Total-Body Skin Exam (TBSE) for skin cancer screening and mole check.  The following portions of the chart were reviewed this encounter and updated as appropriate:   Tobacco  Allergies  Meds  Problems  Med Hx  Surg Hx  Fam Hx      Review of Systems:  No other skin or systemic complaints except as noted in HPI or Assessment and Plan.  Objective  Well appearing patient in no apparent distress; mood and affect are within normal limits.  A full examination was performed including scalp, head, eyes, ears, nose, lips, neck, chest, axillae, abdomen, back, buttocks, bilateral upper extremities, bilateral lower extremities, hands, feet, fingers, toes, fingernails, and toenails. All findings within normal limits unless otherwise noted below.  R mid cheek Well healed scar with no evidence of recurrence.   L post calf Scar with no evidence of recurrence.   L temple x 8 (8) Pink scaly macules   L great toenail Subungual hematoma  Right Knee x 1, L temple x 1, L uppper back x 3, Total = 5 (5) Erythematous keratotic or waxy stuck-on papule or plaque.    Assessment & Plan   Lentigines - Scattered tan macules - Due to sun exposure - Benign-appering, observe - Recommend daily broad spectrum sunscreen SPF 30+ to sun-exposed areas, reapply every 2 hours as needed. - Call for any changes  Seborrheic Keratoses - Stuck-on, waxy, tan-brown papules and/or plaques  - Benign-appearing - Discussed benign etiology and prognosis. - Observe - Call for any changes  Melanocytic Nevi - Tan-brown and/or pink-flesh-colored symmetric macules and papules - Benign appearing on exam today - Observation - Call clinic for new or changing moles - Recommend daily use of broad spectrum spf 30+  sunscreen to sun-exposed areas.   Hemangiomas - Red papules - Discussed benign nature - Observe - Call for any changes  Actinic Damage - Chronic condition, secondary to cumulative UV/sun exposure - diffuse scaly erythematous macules with underlying dyspigmentation - Recommend daily broad spectrum sunscreen SPF 30+ to sun-exposed areas, reapply every 2 hours as needed.  - Staying in the shade or wearing long sleeves, sun glasses (UVA+UVB protection) and wide brim hats (4-inch brim around the entire circumference of the hat) are also recommended for sun protection.  - Call for new or changing lesions.  Skin cancer screening performed today.  Purpura - Chronic; persistent and recurrent.  Treatable, but not curable. - Violaceous macules and patches - Benign - Related to trauma, age, sun damage and/or use of blood thinners, chronic use of topical and/or oral steroids - Observe - Can use OTC arnica containing moisturizer such as Dermend Bruise Formula if desired - Call for worsening or other concerns  History of basal cell carcinoma (BCC) R mid cheek  Clear. Observe for recurrence. Call clinic for new or changing lesions.  Recommend regular skin exams, daily broad-spectrum spf 30+ sunscreen use, and photoprotection.    History of dysplastic nevus L post calf  Clear. Observe for recurrence. Call clinic for new or changing lesions.  Recommend regular skin exams, daily broad-spectrum spf 30+ sunscreen use, and photoprotection.    AK (actinic keratosis) (8) L temple x 8  Destruction of lesion - L temple x 8 Complexity: simple   Destruction method:  cryotherapy   Informed consent: discussed and consent obtained   Timeout:  patient name, date of birth, surgical site, and procedure verified Lesion destroyed using liquid nitrogen: Yes   Region frozen until ice ball extended beyond lesion: Yes   Outcome: patient tolerated procedure well with no complications   Post-procedure details:  wound care instructions given    Traumatic injury L great toenail  2ndary to trauma  Benign, observe  Inflamed seborrheic keratosis Right Knee x 1, L temple x 1, L uppper back x 3, Total = 5  Destruction of lesion - Right Knee x 1, L temple x 1, L uppper back x 3, Total = 5 Complexity: simple   Destruction method: cryotherapy   Informed consent: discussed and consent obtained   Timeout:  patient name, date of birth, surgical site, and procedure verified Lesion destroyed using liquid nitrogen: Yes   Region frozen until ice ball extended beyond lesion: Yes   Outcome: patient tolerated procedure well with no complications   Post-procedure details: wound care instructions given    Skin cancer screening  Return in about 1 year (around 09/19/2021) for TBSE, Hx of BCC, Hx of Dysplastic nevi, Hx of AKs.  Documentation: I have reviewed the above documentation for accuracy and completeness, and I agree with the above.  Sarina Ser, MD

## 2020-09-25 ENCOUNTER — Encounter: Payer: Self-pay | Admitting: Dermatology

## 2020-10-22 DIAGNOSIS — R972 Elevated prostate specific antigen [PSA]: Secondary | ICD-10-CM | POA: Diagnosis not present

## 2020-10-22 DIAGNOSIS — N401 Enlarged prostate with lower urinary tract symptoms: Secondary | ICD-10-CM | POA: Diagnosis not present

## 2020-11-15 ENCOUNTER — Telehealth: Payer: Self-pay | Admitting: Internal Medicine

## 2020-11-15 ENCOUNTER — Other Ambulatory Visit: Payer: Self-pay | Admitting: Family Medicine

## 2020-11-15 ENCOUNTER — Encounter: Payer: Self-pay | Admitting: Unknown Physician Specialty

## 2020-11-15 DIAGNOSIS — Z23 Encounter for immunization: Secondary | ICD-10-CM

## 2020-11-15 MED ORDER — ZOLPIDEM TARTRATE 10 MG PO TABS: 10.0000 mg | ORAL_TABLET | Freq: Every evening | ORAL | 0 refills | Status: DC | PRN

## 2020-11-15 MED ORDER — CIPROFLOXACIN HCL 500 MG PO TABS: 500.0000 mg | ORAL_TABLET | Freq: Two times a day (BID) | ORAL | 0 refills | Status: DC

## 2020-11-15 NOTE — Telephone Encounter (Signed)
Pt is calling back to state that he did receive the phone call to schedule Hep vaccines. He does have records that he received 3 doses of Hep in 2016. Pt reports that he is disappointed in the record keeping of his vaccines. Pt did schedule shringix vaccine on January 16, 2021.

## 2020-11-15 NOTE — Telephone Encounter (Signed)
Left message for patient to schedule appointment.

## 2020-11-15 NOTE — Telephone Encounter (Signed)
Please get him scheduled for nurse visit for Hep A and B. OK to wait on shingrix until he returns. I thought it was 6/21, not 6/22

## 2020-11-23 ENCOUNTER — Other Ambulatory Visit: Payer: Self-pay | Admitting: Nurse Practitioner

## 2020-11-23 DIAGNOSIS — I2583 Coronary atherosclerosis due to lipid rich plaque: Secondary | ICD-10-CM

## 2020-12-06 ENCOUNTER — Other Ambulatory Visit: Payer: Self-pay | Admitting: Nurse Practitioner

## 2020-12-06 DIAGNOSIS — I10 Essential (primary) hypertension: Secondary | ICD-10-CM

## 2020-12-06 NOTE — Telephone Encounter (Signed)
Requested Prescriptions  Pending Prescriptions Disp Refills  . benazepril (LOTENSIN) 10 MG tablet [Pharmacy Med Name: BENAZEPRIL HCL 10 MG TABLET] 90 tablet 0    Sig: TAKE 1 TABLET BY MOUTH EVERY DAY     Cardiovascular:  ACE Inhibitors Failed - 12/06/2020  2:44 AM      Failed - Cr in normal range and within 180 days    Creatinine, Ser  Date Value Ref Range Status  09/12/2020 1.31 (H) 0.76 - 1.27 mg/dL Final         Failed - Last BP in normal range    BP Readings from Last 1 Encounters:  09/12/20 (!) 143/90         Passed - K in normal range and within 180 days    Potassium  Date Value Ref Range Status  09/12/2020 4.4 3.5 - 5.2 mmol/L Final         Passed - Patient is not pregnant      Passed - Valid encounter within last 6 months    Recent Outpatient Visits          2 months ago Stage 3a chronic kidney disease ([place])   [facility] Vigg, Avanti, MD   11 months ago Essential hypertension   [place] Kathrine Haddock, NP   1 year ago Essential hypertension   [facility], [place]   2 years ago Essential hypertension   [place], Jeannette How, MD   2 years ago Essential hypertension   [place], Jeannette How, MD      Future Appointments            In 3 months Vigg, Avanti, MD [facility], [facility]

## 2021-01-13 ENCOUNTER — Encounter: Payer: Self-pay | Admitting: Internal Medicine

## 2021-01-16 ENCOUNTER — Other Ambulatory Visit: Payer: Self-pay

## 2021-01-16 ENCOUNTER — Ambulatory Visit: Payer: Medicare HMO

## 2021-01-16 ENCOUNTER — Ambulatory Visit (INDEPENDENT_AMBULATORY_CARE_PROVIDER_SITE_OTHER): Payer: Medicare HMO

## 2021-01-16 DIAGNOSIS — Z23 Encounter for immunization: Secondary | ICD-10-CM

## 2021-02-18 ENCOUNTER — Other Ambulatory Visit: Payer: Self-pay | Admitting: Nurse Practitioner

## 2021-02-18 DIAGNOSIS — E7800 Pure hypercholesterolemia, unspecified: Secondary | ICD-10-CM

## 2021-02-18 NOTE — Telephone Encounter (Signed)
Requested Prescriptions  Pending Prescriptions Disp Refills  . atorvastatin (LIPITOR) 40 MG tablet [Pharmacy Med Name: ATORVASTATIN 40 MG TABLET] 90 tablet 1    Sig: TAKE 1 TABLET BY MOUTH EVERY DAY     Cardiovascular:  Antilipid - Statins Passed - 02/18/2021  1:28 AM      Passed - Total Cholesterol in normal range and within 360 days    Cholesterol, Total  Date Value Ref Range Status  09/12/2020 176 100 - 199 mg/dL Final   Cholesterol Piccolo, Waived  Date Value Ref Range Status  11/30/2018 175 <200 mg/dL Final    Comment:                            Desirable                <200                         Borderline High      200- 239                         High                     >239          Passed - LDL in normal range and within 360 days    LDL Chol Calc (NIH)  Date Value Ref Range Status  09/12/2020 86 0 - 99 mg/dL Final   LDL-C  Date Value Ref Range Status  04/24/2015 CANCELED mg/dL     Comment:    Unable to calculate result since non-numeric result obtained for component test.                           Optimal               <  100                           Above optimal     100 -  129                           Borderline        130 -  159                           High              160 -  189                           Very high             >  189 LDL-C is inaccurate if patient is non-fasting.  Result canceled by the ancillary          Passed - HDL in normal range and within 360 days    HDL Cholesterol by NMR  Date Value Ref Range Status  04/24/2015 CANCELED      Comment:    Test not performed  Result canceled by the ancillary    HDL  Date Value Ref Range Status  09/12/2020 77 >39 mg/dL Final         Passed - Triglycerides in  normal range and within 360 days    Triglycerides  Date Value Ref Range Status  09/12/2020 70 0 - 149 mg/dL Final   Triglycerides by NMR  Date Value Ref Range Status  04/24/2015 CANCELED      Comment:    Test not  performed  Result canceled by the ancillary    Triglycerides Piccolo,Waived  Date Value Ref Range Status  11/30/2018 74 <150 mg/dL Final    Comment:                            Normal                   <150                         Borderline High     150 - 199                         High                200 - 499                         Very High                >499          Passed - Patient is not pregnant      Passed - Valid encounter within last 12 months    Recent Outpatient Visits          5 months ago Stage 3a chronic kidney disease ([place])   [facility] Vigg, Avanti, MD   1 year ago Essential hypertension   [place] Kathrine Haddock, NP   1 year ago Essential hypertension   [facility], [place]   2 years ago Essential hypertension   [place], Jeannette How, MD   2 years ago Essential hypertension   [place], Jeannette How, MD      Future Appointments            In 1 month Vigg, Avanti, MD [facility], [facility]

## 2021-02-28 ENCOUNTER — Encounter: Payer: Medicare HMO | Admitting: Internal Medicine

## 2021-03-02 ENCOUNTER — Other Ambulatory Visit: Payer: Self-pay | Admitting: Internal Medicine

## 2021-03-02 DIAGNOSIS — I10 Essential (primary) hypertension: Secondary | ICD-10-CM

## 2021-03-02 NOTE — Telephone Encounter (Signed)
Requested Prescriptions  Pending Prescriptions Disp Refills  . benazepril (LOTENSIN) 10 MG tablet [Pharmacy Med Name: BENAZEPRIL HCL 10 MG TABLET] 30 tablet 0    Sig: TAKE 1 TABLET BY MOUTH EVERY DAY     Cardiovascular:  ACE Inhibitors Failed - 03/02/2021  1:46 AM      Failed - Cr in normal range and within 180 days    Creatinine, Ser  Date Value Ref Range Status  09/12/2020 1.31 (H) 0.76 - 1.27 mg/dL Final         Failed - Last BP in normal range    BP Readings from Last 1 Encounters:  09/12/20 (!) 143/90         Passed - K in normal range and within 180 days    Potassium  Date Value Ref Range Status  09/12/2020 4.4 3.5 - 5.2 mmol/L Final         Passed - Patient is not pregnant      Passed - Valid encounter within last 6 months    Recent Outpatient Visits          5 months ago Stage 3a chronic kidney disease ([place])   [facility] Vigg, Avanti, MD   1 year ago Essential hypertension   [facility] Kathrine Haddock, NP   1 year ago Essential hypertension   [facility], [place]   2 years ago Essential hypertension   [place], Jeannette How, MD   2 years ago Essential hypertension   [facility] [place], Jeannette How, MD

## 2021-03-18 ENCOUNTER — Encounter: Payer: Medicare HMO | Admitting: Internal Medicine

## 2021-03-18 ENCOUNTER — Encounter: Payer: Self-pay | Admitting: Internal Medicine

## 2021-03-18 NOTE — Telephone Encounter (Signed)
Pl see what pt needs ok to refiill. Thnx!

## 2021-03-20 ENCOUNTER — Other Ambulatory Visit: Payer: Medicare HMO

## 2021-03-20 ENCOUNTER — Other Ambulatory Visit: Payer: Self-pay

## 2021-03-20 ENCOUNTER — Encounter: Payer: Medicare HMO | Admitting: Internal Medicine

## 2021-03-20 DIAGNOSIS — I2510 Atherosclerotic heart disease of native coronary artery without angina pectoris: Secondary | ICD-10-CM

## 2021-03-20 DIAGNOSIS — I10 Essential (primary) hypertension: Secondary | ICD-10-CM

## 2021-03-20 DIAGNOSIS — E7800 Pure hypercholesterolemia, unspecified: Secondary | ICD-10-CM | POA: Diagnosis not present

## 2021-03-20 DIAGNOSIS — N183 Chronic kidney disease, stage 3 unspecified: Secondary | ICD-10-CM

## 2021-03-21 LAB — CBC WITH DIFFERENTIAL/PLATELET
Basophils Absolute: 0.1 10*3/uL (ref 0.0–0.2)
Basos: 1 %
EOS (ABSOLUTE): 0.1 10*3/uL (ref 0.0–0.4)
Eos: 2 %
Hematocrit: 48.3 % (ref 37.5–51.0)
Hemoglobin: 15.9 g/dL (ref 13.0–17.7)
Immature Grans (Abs): 0 10*3/uL (ref 0.0–0.1)
Immature Granulocytes: 0 %
Lymphocytes Absolute: 2.2 10*3/uL (ref 0.7–3.1)
Lymphs: 37 %
MCH: 29.5 pg (ref 26.6–33.0)
MCHC: 32.9 g/dL (ref 31.5–35.7)
MCV: 90 fL (ref 79–97)
Monocytes Absolute: 0.6 10*3/uL (ref 0.1–0.9)
Monocytes: 10 %
Neutrophils Absolute: 2.9 10*3/uL (ref 1.4–7.0)
Neutrophils: 50 %
Platelets: 228 10*3/uL (ref 150–450)
RBC: 5.39 x10E6/uL (ref 4.14–5.80)
RDW: 12.6 % (ref 11.6–15.4)
WBC: 5.8 10*3/uL (ref 3.4–10.8)

## 2021-03-21 LAB — CMP14+EGFR
ALT: 18 IU/L (ref 0–44)
AST: 22 IU/L (ref 0–40)
Albumin/Globulin Ratio: 1.9 (ref 1.2–2.2)
Albumin: 4.5 g/dL (ref 3.7–4.7)
Alkaline Phosphatase: 59 IU/L (ref 44–121)
BUN/Creatinine Ratio: 11 (ref 10–24)
BUN: 14 mg/dL (ref 8–27)
Bilirubin Total: 1 mg/dL (ref 0.0–1.2)
CO2: 24 mmol/L (ref 20–29)
Calcium: 9.4 mg/dL (ref 8.6–10.2)
Chloride: 97 mmol/L (ref 96–106)
Creatinine, Ser: 1.3 mg/dL — ABNORMAL HIGH (ref 0.76–1.27)
Globulin, Total: 2.4 g/dL (ref 1.5–4.5)
Glucose: 109 mg/dL — ABNORMAL HIGH (ref 70–99)
Potassium: 4.5 mmol/L (ref 3.5–5.2)
Sodium: 136 mmol/L (ref 134–144)
Total Protein: 6.9 g/dL (ref 6.0–8.5)
eGFR: 58 mL/min/{1.73_m2} — ABNORMAL LOW (ref >59)

## 2021-03-21 LAB — TSH: TSH: 3.2 u[IU]/mL (ref 0.450–4.500)

## 2021-03-21 LAB — LIPID PANEL
Chol/HDL Ratio: 2.5 ratio (ref 0.0–5.0)
Cholesterol, Total: 191 mg/dL (ref 100–199)
HDL: 76 mg/dL (ref >39)
LDL Chol Calc (NIH): 94 mg/dL (ref 0–99)
Triglycerides: 123 mg/dL (ref 0–149)
VLDL Cholesterol Cal: 21 mg/dL (ref 5–40)

## 2021-03-27 ENCOUNTER — Other Ambulatory Visit: Payer: Self-pay | Admitting: Internal Medicine

## 2021-03-27 ENCOUNTER — Telehealth: Payer: Self-pay | Admitting: *Deleted

## 2021-03-27 DIAGNOSIS — I10 Essential (primary) hypertension: Secondary | ICD-10-CM

## 2021-03-27 NOTE — Telephone Encounter (Signed)
Left VM to schedule OV for benazepril refill. Routed request to office.

## 2021-03-27 NOTE — Telephone Encounter (Signed)
Requested medication (s) are due for refill today Yes.  Last prescribed 03/02/21 #30 tabs as courtesy refill.   Requested medication (s) are on the active medication list Yes  Future visit scheduled No.   Note to office-#30 day courtesy refill provided on 03/02/21. Left patient VM to schedule appointment for continued refills.  Last OV 09/12/20.   Requested Prescriptions  Pending Prescriptions Disp Refills   benazepril (LOTENSIN) 10 MG tablet [Pharmacy Med Name: BENAZEPRIL HCL 10 MG TABLET] 30 tablet 0    Sig: TAKE 1 TABLET BY MOUTH EVERY DAY     Cardiovascular:  ACE Inhibitors Failed - 03/27/2021  1:32 PM      Failed - Cr in normal range and within 180 days    Creatinine, Ser  Date Value Ref Range Status  03/20/2021 1.30 (H) 0.76 - 1.27 mg/dL Final          Failed - Last BP in normal range    BP Readings from Last 1 Encounters:  09/12/20 (!) 143/90          Failed - Valid encounter within last 6 months    Recent Outpatient Visits           6 months ago Stage 3a chronic kidney disease ([place])   [facility] Vigg, Avanti, MD   1 year ago Essential hypertension   [place] Kathrine Haddock, NP   1 year ago Essential hypertension   [place], [place], [place]   2 years ago Essential hypertension   [place], Jeannette How, MD   2 years ago Essential hypertension   [place], Jeannette How, MD              Passed - K in normal range and within 180 days    Potassium  Date Value Ref Range Status  03/20/2021 4.5 3.5 - 5.2 mmol/L Final          Passed - Patient is not pregnant

## 2021-03-28 NOTE — Telephone Encounter (Signed)
Called pt to set up a virtual appt to get his refill. Pt states that the nurse he talked to told him that she would send in without needing an appt. I let patient know that no medications were sent in for him he would have to speak with his provider during an appt. I let him know our policies on refill requests. He states since he has a physical on Jan 17th he doesn't need an additional appt.

## 2021-03-28 NOTE — Telephone Encounter (Signed)
Pt. Called back. Currently lives in [place]. Has to check calendar before he schedules appointment. States appointment in December was re-scheduled by the practice, but he did come in for labs while he was in town. Back in [place] now. States the only medication he needs within 7 days is his benazepril. Leaving the country for a trip soon. As[facility] [place]. Please advise pt.

## 2021-04-16 ENCOUNTER — Other Ambulatory Visit: Payer: Self-pay

## 2021-04-16 ENCOUNTER — Ambulatory Visit (INDEPENDENT_AMBULATORY_CARE_PROVIDER_SITE_OTHER): Payer: Medicare HMO | Admitting: Internal Medicine

## 2021-04-16 VITALS — BP 150/76 | HR 69 | Temp 98.4°F | Ht 71.65 in | Wt 195.0 lb

## 2021-04-16 DIAGNOSIS — Z0000 Encounter for general adult medical examination without abnormal findings: Secondary | ICD-10-CM | POA: Diagnosis not present

## 2021-04-16 NOTE — Progress Notes (Signed)
BP (!) 150/76    Pulse 69    Temp 98.4 F (36.9 C) (Oral)    Ht 5' 11.65" (1.82 m)    Wt 195 lb (88.5 kg)    SpO2 98%    BMI 26.70 kg/m    Subjective:    Patient ID: Hector Woodard, male    DOB: 1945/04/30, 76 y.o.   MRN: [id number]  Chief Complaint  Patient presents with   Annual Exam    HPI: Hector Woodard is a 76 y.o. male  Pt is here annual physical.  Not been ableto exercise as much, jogs some, lives in [place] comes here may - October every year.   Hypertension This is a chronic problem. The current episode started more than 1 year ago. The problem is uncontrolled. Pertinent negatives include no anxiety, blurred vision, chest pain, headaches, malaise/fatigue, neck pain, orthopnea, palpitations, peripheral edema, PND, shortness of breath or sweats.  Hyperlipidemia This is a chronic problem. The problem is controlled. Pertinent negatives include no chest pain, myalgias or shortness of breath.   Chief Complaint  Patient presents with   Annual Exam    Relevant past medical, surgical, family and social history reviewed and updated as indicated. Interim medical history since our last visit reviewed. Allergies and medications reviewed and updated.  Review of Systems  Constitutional:  Negative for activity change, appetite change, chills, fatigue, fever and malaise/fatigue.  HENT:  Negative for congestion, ear discharge, ear pain and facial swelling.   Eyes:  Negative for blurred vision, pain, discharge and itching.  Respiratory:  Negative for cough, chest tightness, shortness of breath and wheezing.   Cardiovascular:  Negative for chest pain, palpitations, orthopnea, leg swelling and PND.  Gastrointestinal:  Negative for abdominal distention, abdominal pain, blood in stool, constipation, diarrhea, nausea and vomiting.  Endocrine: Negative for cold intolerance, heat intolerance, polydipsia, polyphagia and polyuria.  Genitourinary:  Negative for difficulty urinating, dysuria,  flank pain, frequency, hematuria and urgency.  Musculoskeletal:  Negative for arthralgias, gait problem, joint swelling, myalgias and neck pain.  Skin:  Negative for color change, rash and wound.  Neurological:  Negative for dizziness, tremors, speech difficulty, weakness, light-headedness, numbness and headaches.  Hematological:  Does not bruise/bleed easily.  Psychiatric/Behavioral:  Negative for agitation, confusion, decreased concentration, sleep disturbance and suicidal ideas.    Per HPI unless specifically indicated above     Objective:    BP (!) 150/76    Pulse 69    Temp 98.4 F (36.9 C) (Oral)    Ht 5' 11.65" (1.82 m)    Wt 195 lb (88.5 kg)    SpO2 98%    BMI 26.70 kg/m   Wt Readings from Last 3 Encounters:  04/16/21 195 lb (88.5 kg)  09/12/20 190 lb 6.4 oz (86.4 kg)  12/29/19 191 lb 6.4 oz (86.8 kg)    Physical Exam Vitals and nursing note reviewed.  Constitutional:      General: He is not in acute distress.    Appearance: Normal appearance. He is not ill-appearing or diaphoretic.  HENT:     Head: Normocephalic and atraumatic.     Right Ear: Tympanic membrane and external ear normal. There is no impacted cerumen.     Left Ear: External ear normal.     Nose: No congestion or rhinorrhea.     Mouth/Throat:     Pharynx: No oropharyngeal exudate or posterior oropharyngeal erythema.  Eyes:     Conjunctiva/sclera: Conjunctivae normal.  Pupils: Pupils are equal, round, and reactive to light.  Cardiovascular:     Rate and Rhythm: Normal rate and regular rhythm.     Heart sounds: No murmur heard.   No friction rub. No gallop.  Pulmonary:     Effort: No respiratory distress.     Breath sounds: No stridor. No wheezing or rhonchi.  Chest:     Chest wall: No tenderness.  Abdominal:     General: Abdomen is flat. Bowel sounds are normal.     Palpations: Abdomen is soft. There is no mass.     Tenderness: There is no abdominal tenderness. There is no rebound.     Hernia: No  hernia is present.  Musculoskeletal:        General: No swelling or tenderness.     Cervical back: Normal range of motion and neck supple. No rigidity or tenderness.     Left lower leg: No edema.  Skin:    General: Skin is warm and dry.  Neurological:     General: No focal deficit present.     Mental Status: He is alert and oriented to person, place, and time.     Cranial Nerves: No cranial nerve deficit.     Sensory: No sensory deficit.  Psychiatric:        Mood and Affect: Mood normal.        Behavior: Behavior normal.        Thought Content: Thought content normal.        Judgment: Judgment normal.    Results for orders placed or performed in visit on 03/20/21  CMP14+EGFR  Result Value Ref Range   Glucose 109 (H) 70 - 99 mg/dL   BUN 14 8 - 27 mg/dL   Creatinine, Ser 1.30 (H) 0.76 - 1.27 mg/dL   eGFR 58 (L) >59 mL/min/1.73   BUN/Creatinine Ratio 11 10 - 24   Sodium 136 134 - 144 mmol/L   Potassium 4.5 3.5 - 5.2 mmol/L   Chloride 97 96 - 106 mmol/L   CO2 24 20 - 29 mmol/L   Calcium 9.4 8.6 - 10.2 mg/dL   Total Protein 6.9 6.0 - 8.5 g/dL   Albumin 4.5 3.7 - 4.7 g/dL   Globulin, Total 2.4 1.5 - 4.5 g/dL   Albumin/Globulin Ratio 1.9 1.2 - 2.2   Bilirubin Total 1.0 0.0 - 1.2 mg/dL   Alkaline Phosphatase 59 44 - 121 IU/L   AST 22 0 - 40 IU/L   ALT 18 0 - 44 IU/L  Lipid panel  Result Value Ref Range   Cholesterol, Total 191 100 - 199 mg/dL   Triglycerides 123 0 - 149 mg/dL   HDL 76 >39 mg/dL   VLDL Cholesterol Cal 21 5 - 40 mg/dL   LDL Chol Calc (NIH) 94 0 - 99 mg/dL   Chol/HDL Ratio 2.5 0.0 - 5.0 ratio  CBC with Differential/Platelet  Result Value Ref Range   WBC 5.8 3.4 - 10.8 x10E3/uL   RBC 5.39 4.14 - 5.80 x10E6/uL   Hemoglobin 15.9 13.0 - 17.7 g/dL   Hematocrit 48.3 37.5 - 51.0 %   MCV 90 79 - 97 fL   MCH 29.5 26.6 - 33.0 pg   MCHC 32.9 31.5 - 35.7 g/dL   RDW 12.6 11.6 - 15.4 %   Platelets 228 150 - 450 x10E3/uL   Neutrophils 50 Not Estab. %   Lymphs 37 Not  Estab. %   Monocytes 10 Not Estab. %   Eos  2 Not Estab. %   Basos 1 Not Estab. %   Neutrophils Absolute 2.9 1.4 - 7.0 x10E3/uL   Lymphocytes Absolute 2.2 0.7 - 3.1 x10E3/uL   Monocytes Absolute 0.6 0.1 - 0.9 x10E3/uL   EOS (ABSOLUTE) 0.1 0.0 - 0.4 x10E3/uL   Basophils Absolute 0.1 0.0 - 0.2 x10E3/uL   Immature Granulocytes 0 Not Estab. %   Immature Grans (Abs) 0.0 0.0 - 0.1 x10E3/uL  TSH  Result Value Ref Range   TSH 3.200 0.450 - 4.500 uIU/mL        Current Outpatient Medications:    aspirin EC 81 MG tablet, Take 81 mg by mouth daily., Disp: , Rfl:    atorvastatin (LIPITOR) 40 MG tablet, TAKE 1 TABLET BY MOUTH EVERY DAY, Disp: 90 tablet, Rfl: 1   benazepril (LOTENSIN) 10 MG tablet, TAKE 1 TABLET BY MOUTH EVERY DAY, Disp: 30 tablet, Rfl: 0   clopidogrel (PLAVIX) 75 MG tablet, TAKE 1 TABLET BY MOUTH EVERY DAY, Disp: 90 tablet, Rfl: 1   dutasteride (AVODART) 0.5 MG capsule, Take by mouth daily. , Disp: , Rfl:    Multiple Vitamin (MULTIVITAMIN) capsule, Take 1 capsule by mouth daily., Disp: , Rfl:    Omega-3 Fatty Acids (FISH OIL) 1000 MG CAPS, Take 1 capsule by mouth at bedtime., Disp: , Rfl:     Assessment & Plan:   HTN : stable , is on benzapril for such  Continue current meds.  Medication compliance emphasised. pt advised to keep Bp logs. Pt verbalised understanding of the same. Pt to have a low salt diet . Exercise to reach a goal of at least 150 mins a week.  lifestyle modifications explained and pt understands importance of the above.   HLD recheck FLP, check LFT's work on diet, SE of meds explained to pt. low fat and high fiber diet explained to pt.   Elevated Psdone this year sees urology seen them last summer. No PSA / notes on file. Need to obtain Ho BPH is on avodart for such   Problem List Items Addressed This Visit   None    No orders of the defined types were placed in this encounter.    No orders of the defined types were placed in this encounter.     Follow up plan: No follow-ups on file.

## 2021-04-17 ENCOUNTER — Encounter: Payer: Self-pay | Admitting: Internal Medicine

## 2021-04-29 ENCOUNTER — Other Ambulatory Visit: Payer: Self-pay | Admitting: Internal Medicine

## 2021-04-29 DIAGNOSIS — I10 Essential (primary) hypertension: Secondary | ICD-10-CM

## 2021-04-29 NOTE — Telephone Encounter (Signed)
Requested Prescriptions  Pending Prescriptions Disp Refills   benazepril (LOTENSIN) 10 MG tablet [Pharmacy Med Name: BENAZEPRIL HCL 10 MG TABLET] 30 tablet 0    Sig: TAKE 1 TABLET BY MOUTH EVERY DAY     Cardiovascular:  ACE Inhibitors Failed - 04/29/2021 11:59 AM      Failed - Cr in normal range and within 180 days    Creatinine, Ser  Date Value Ref Range Status  03/20/2021 1.30 (H) 0.76 - 1.27 mg/dL Final         Failed - Last BP in normal range    BP Readings from Last 1 Encounters:  04/16/21 (!) 150/76         Passed - K in normal range and within 180 days    Potassium  Date Value Ref Range Status  03/20/2021 4.5 3.5 - 5.2 mmol/L Final         Passed - Patient is not pregnant      Passed - Valid encounter within last 6 months    Recent Outpatient Visits          1 week ago Annual physical exam   [facility] Vigg, Avanti, MD   7 months ago Stage 3a chronic kidney disease ([place])   [facility] Vigg, Avanti, MD   1 year ago Essential hypertension   [place] Kathrine Haddock, NP   1 year ago Essential hypertension   [facility], [place]   2 years ago Essential hypertension   [place], [place]      Future Appointments            In 5 months Vigg, Avanti, MD [facility], [place]

## 2021-05-19 ENCOUNTER — Other Ambulatory Visit: Payer: Self-pay | Admitting: Internal Medicine

## 2021-05-19 DIAGNOSIS — I2583 Coronary atherosclerosis due to lipid rich plaque: Secondary | ICD-10-CM

## 2021-05-19 DIAGNOSIS — I2510 Atherosclerotic heart disease of native coronary artery without angina pectoris: Secondary | ICD-10-CM

## 2021-05-20 NOTE — Telephone Encounter (Signed)
Requested Prescriptions  Pending Prescriptions Disp Refills   clopidogrel (PLAVIX) 75 MG tablet [Pharmacy Med Name: CLOPIDOGREL 75 MG TABLET] 90 tablet 1    Sig: TAKE 1 TABLET BY MOUTH EVERY DAY     Hematology: Antiplatelets - clopidogrel Failed - 05/19/2021 12:50 AM      Failed - Cr in normal range and within 360 days    Creatinine, Ser  Date Value Ref Range Status  03/20/2021 1.30 (H) 0.76 - 1.27 mg/dL Final         Passed - HCT in normal range and within 180 days    Hematocrit  Date Value Ref Range Status  03/20/2021 48.3 37.5 - 51.0 % Final         Passed - HGB in normal range and within 180 days    Hemoglobin  Date Value Ref Range Status  03/20/2021 15.9 13.0 - 17.7 g/dL Final         Passed - PLT in normal range and within 180 days    Platelets  Date Value Ref Range Status  03/20/2021 228 150 - 450 x10E3/uL Final         Passed - Valid encounter within last 6 months    Recent Outpatient Visits          1 month ago Annual physical exam   [facility] Vigg, Avanti, MD   8 months ago Stage 3a chronic kidney disease ([place])   [facility] Vigg, Avanti, MD   1 year ago Essential hypertension   [place] Kathrine Haddock, NP   1 year ago Essential hypertension   [facility], [place]   2 years ago Essential hypertension   [place], Jeannette How, MD      Future Appointments            In 4 months Vigg, Avanti, MD [facility], [facility]

## 2021-08-01 IMAGING — CR DG SHOULDER 2+V*R*
1 series · 3 of 3 positions shown · non-contrast
Comparison: None.

CLINICAL DATA: Chronic right shoulder pain

EXAM:
RIGHT SHOULDER - 2+ VIEW

[Series 1: dg shoulder right · 0.14mm/px · 3 of 3 slices shown]
[im 1/3]
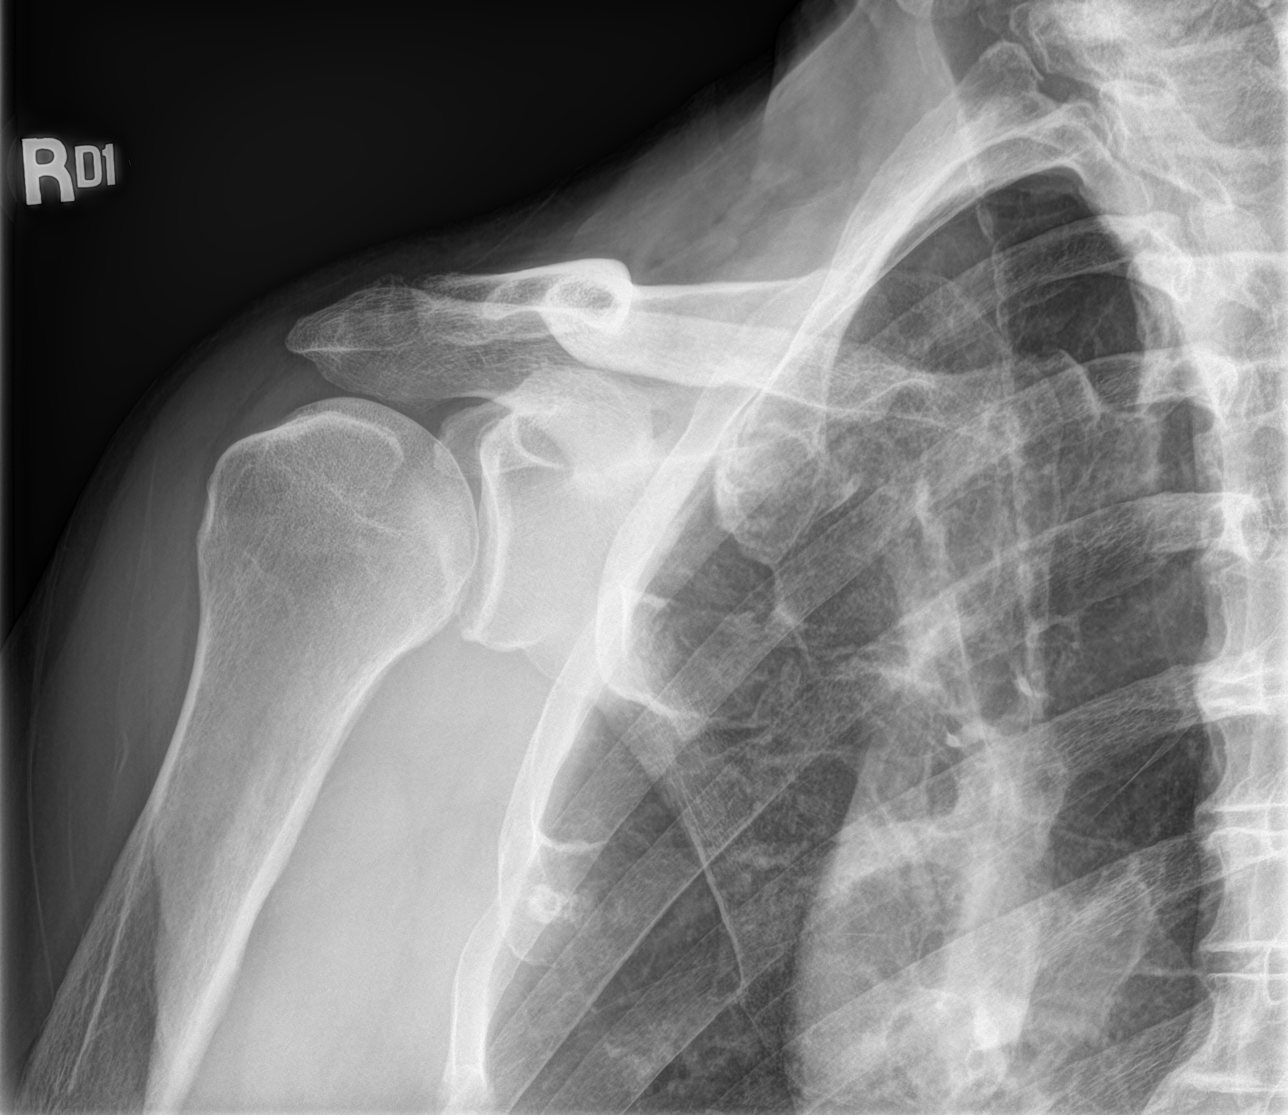
[im 2/3]
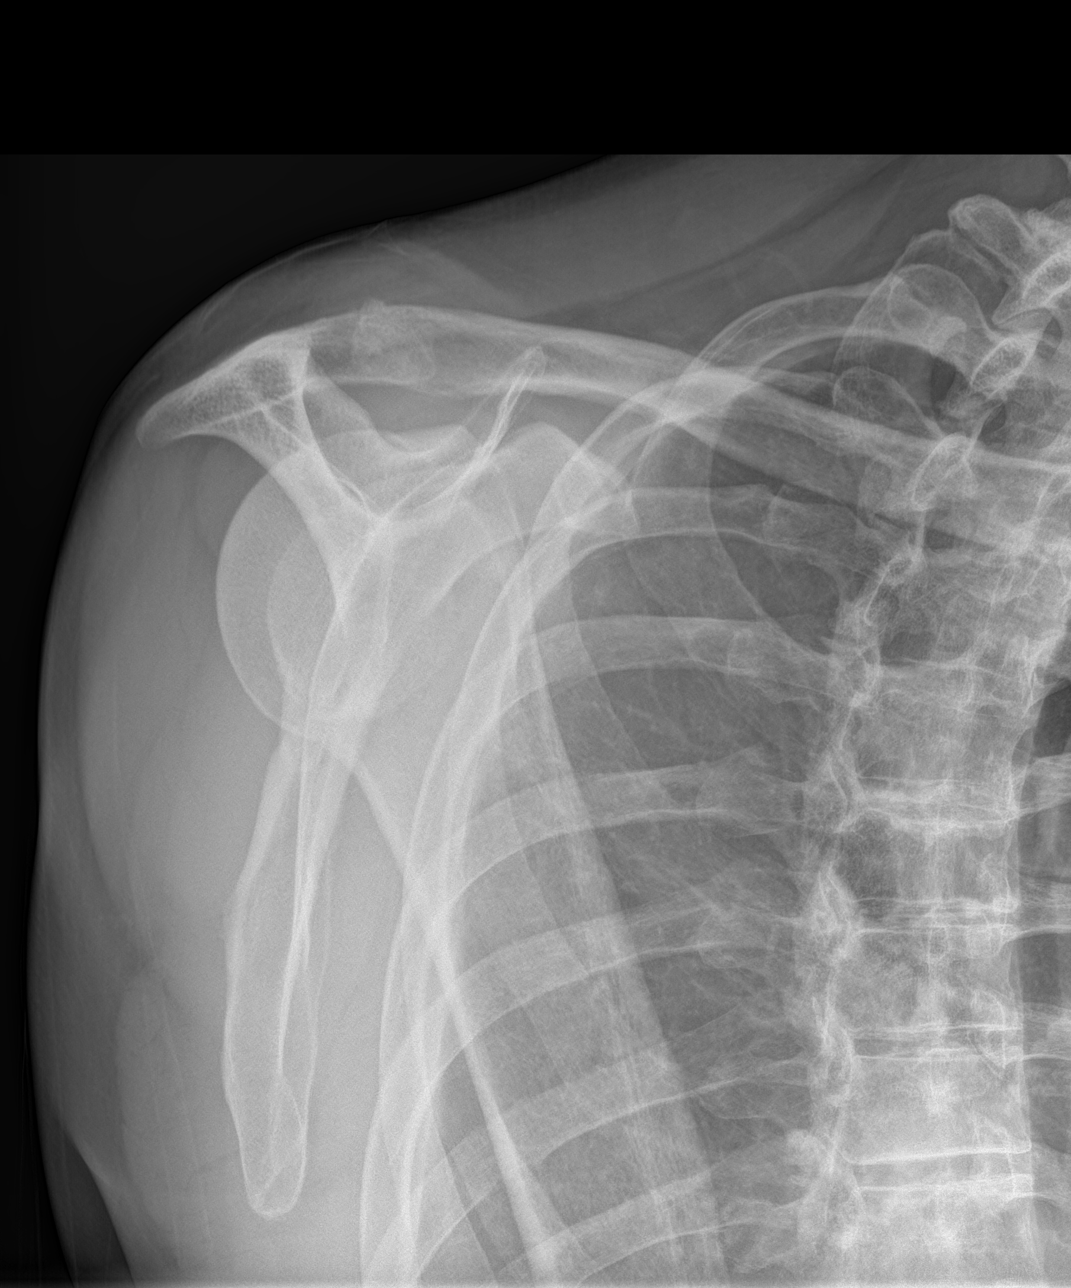
[im 3/3]
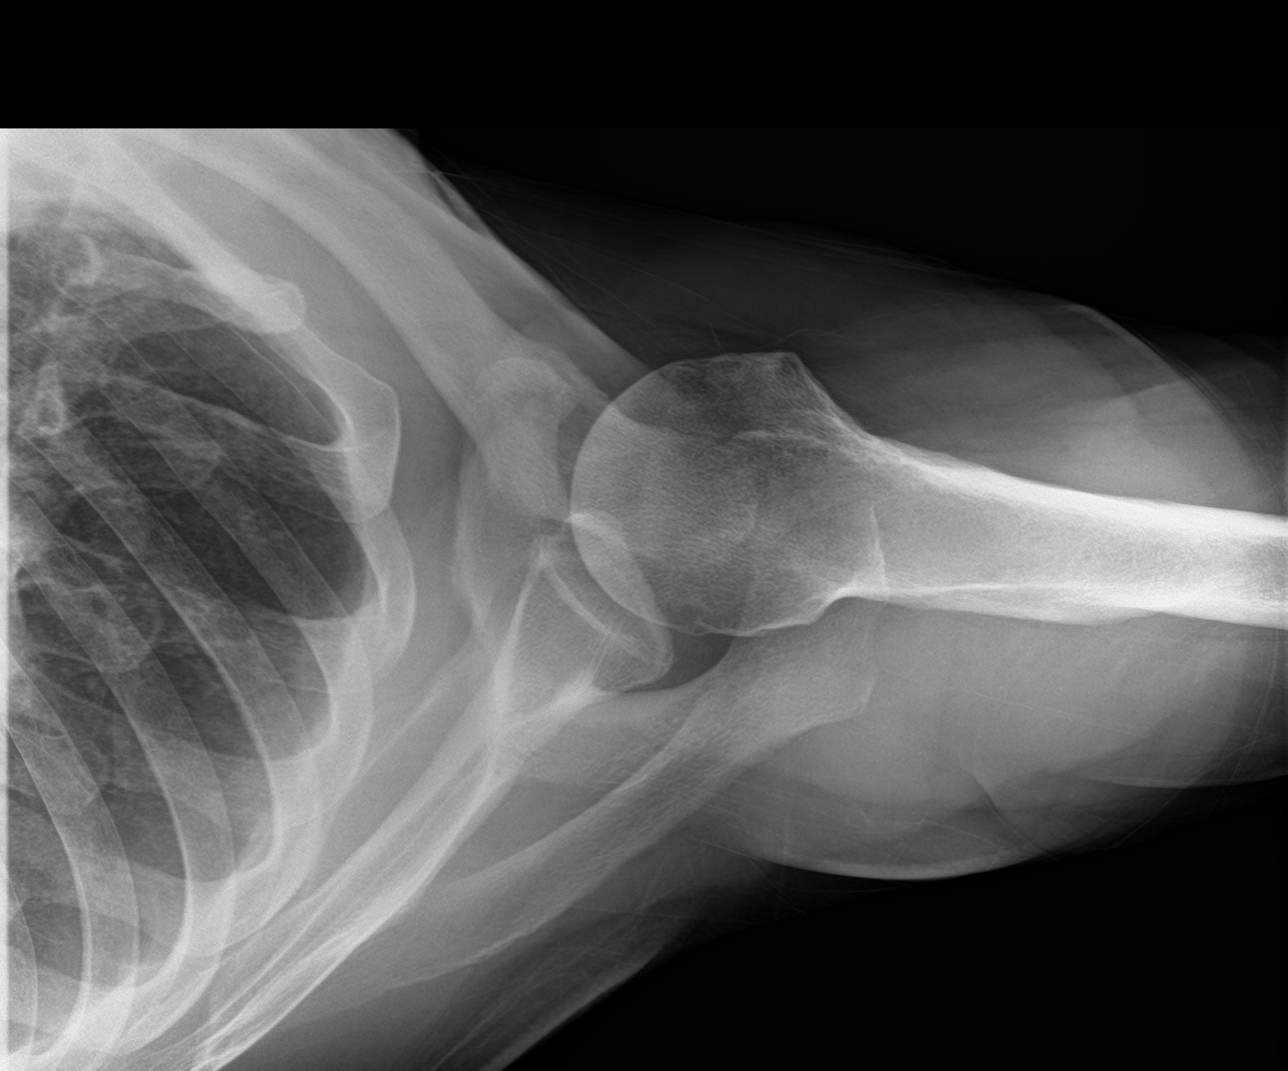

[3 of 3 positions shown; findings below may reference images not displayed]

FINDINGS: Degenerative changes in the AC joint with joint space narrowing and
spurring. Glenohumeral joint is maintained. No acute bony
abnormality. Specifically, no fracture, subluxation, or dislocation.
Soft tissues are intact.
IMPRESSION: Early degenerative changes of the right AC joint. No acute bony
abnormality.

## 2021-08-15 ENCOUNTER — Other Ambulatory Visit: Payer: Self-pay | Admitting: Nurse Practitioner

## 2021-08-15 DIAGNOSIS — E7800 Pure hypercholesterolemia, unspecified: Secondary | ICD-10-CM

## 2021-08-15 NOTE — Telephone Encounter (Signed)
Requested Prescriptions  Pending Prescriptions Disp Refills  . atorvastatin (LIPITOR) 40 MG tablet [Pharmacy Med Name: ATORVASTATIN 40 MG TABLET] 90 tablet 1    Sig: TAKE 1 TABLET BY MOUTH EVERY DAY     Cardiovascular:  Antilipid - Statins Failed - 08/15/2021 12:57 AM      Failed - Lipid Panel in normal range within the last 12 months    Cholesterol, Total  Date Value Ref Range Status  03/20/2021 191 100 - 199 mg/dL Final   Cholesterol Piccolo, Waived  Date Value Ref Range Status  11/30/2018 175 <200 mg/dL Final    Comment:                            Desirable                <200                         Borderline High      200- 239                         High                     >239    LDL Chol Calc (NIH)  Date Value Ref Range Status  03/20/2021 94 0 - 99 mg/dL Final   LDL-C  Date Value Ref Range Status  04/24/2015 CANCELED mg/dL     Comment:    Unable to calculate result since non-numeric result obtained for component test.                           Optimal               <  100                           Above optimal     100 -  129                           Borderline        130 -  159                           High              160 -  189                           Very high             >  189 LDL-C is inaccurate if patient is non-fasting.  Result canceled by the ancillary    HDL Cholesterol by NMR  Date Value Ref Range Status  04/24/2015 CANCELED      Comment:    Test not performed  Result canceled by the ancillary    HDL  Date Value Ref Range Status  03/20/2021 76 >39 mg/dL Final   Triglycerides  Date Value Ref Range Status  03/20/2021 123 0 - 149 mg/dL Final   Triglycerides by NMR  Date Value Ref Range Status  04/24/2015 CANCELED      Comment:    Test not performed  Result canceled by the ancillary  Triglycerides Piccolo,Waived  Date Value Ref Range Status  11/30/2018 74 <150 mg/dL Final    Comment:                            Normal                    <150                         Borderline High     150 - 199                         High                200 - 499                         Very High                >499          Passed - Patient is not pregnant      Passed - Valid encounter within last 12 months    Recent Outpatient Visits          4 months ago Annual physical exam   [facility] Vigg, Avanti, MD   11 months ago Stage 3a chronic kidney disease ([place])   [facility] Vigg, Avanti, MD   1 year ago Essential hypertension   [place] Kathrine Haddock, NP   2 years ago Essential hypertension   [facility], [place]   2 years ago Essential hypertension   [place], MD      Future Appointments            In 2 months Mecum, Dani Gobble, PA-C [employer], [facility]

## 2021-09-26 ENCOUNTER — Encounter: Payer: Medicare HMO | Admitting: Dermatology

## 2021-10-02 DIAGNOSIS — I25118 Atherosclerotic heart disease of native coronary artery with other forms of angina pectoris: Secondary | ICD-10-CM | POA: Diagnosis not present

## 2021-10-02 DIAGNOSIS — I10 Essential (primary) hypertension: Secondary | ICD-10-CM | POA: Diagnosis not present

## 2021-10-02 DIAGNOSIS — E782 Mixed hyperlipidemia: Secondary | ICD-10-CM | POA: Diagnosis not present

## 2021-10-10 ENCOUNTER — Ambulatory Visit: Payer: Medicare HMO | Admitting: Dermatology

## 2021-10-10 DIAGNOSIS — D692 Other nonthrombocytopenic purpura: Secondary | ICD-10-CM

## 2021-10-10 DIAGNOSIS — D1800 Hemangioma unspecified site: Secondary | ICD-10-CM

## 2021-10-10 DIAGNOSIS — Z85828 Personal history of other malignant neoplasm of skin: Secondary | ICD-10-CM

## 2021-10-10 DIAGNOSIS — Z1283 Encounter for screening for malignant neoplasm of skin: Secondary | ICD-10-CM | POA: Diagnosis not present

## 2021-10-10 DIAGNOSIS — D229 Melanocytic nevi, unspecified: Secondary | ICD-10-CM

## 2021-10-10 DIAGNOSIS — L578 Other skin changes due to chronic exposure to nonionizing radiation: Secondary | ICD-10-CM | POA: Diagnosis not present

## 2021-10-10 DIAGNOSIS — L814 Other melanin hyperpigmentation: Secondary | ICD-10-CM | POA: Diagnosis not present

## 2021-10-10 DIAGNOSIS — L570 Actinic keratosis: Secondary | ICD-10-CM

## 2021-10-10 DIAGNOSIS — L818 Other specified disorders of pigmentation: Secondary | ICD-10-CM

## 2021-10-10 DIAGNOSIS — L821 Other seborrheic keratosis: Secondary | ICD-10-CM

## 2021-10-10 NOTE — Progress Notes (Signed)
Follow-Up Visit   Subjective  Hector Woodard is a 76 y.o. male who presents for the following: Annual Exam (Hx of BCC, dysplastic nevus, AK's ). The patient presents for Total-Body Skin Exam (TBSE) for skin cancer screening and mole check.  The patient has spots, moles and lesions to be evaluated, some may be new or changing and the patient has concerns that these could be cancer.  The following portions of the chart were reviewed this encounter and updated as appropriate:   Tobacco  Allergies  Meds  Problems  Med Hx  Surg Hx  Fam Hx     Review of Systems:  No other skin or systemic complaints except as noted in HPI or Assessment and Plan.  Objective  Well appearing patient in no apparent distress; mood and affect are within normal limits.  A full examination was performed including scalp, head, eyes, ears, nose, lips, neck, chest, axillae, abdomen, back, buttocks, bilateral upper extremities, bilateral lower extremities, hands, feet, fingers, toes, fingernails, and toenails. All findings within normal limits unless otherwise noted below.  L temple x 2 (2) Erythematous thin papules/macules with gritty scale.    Assessment & Plan  AK (actinic keratosis) (2) L temple x 2  Destruction of lesion - L temple x 2 Complexity: simple   Destruction method: cryotherapy   Informed consent: discussed and consent obtained   Timeout:  patient name, date of birth, surgical site, and procedure verified Lesion destroyed using liquid nitrogen: Yes   Region frozen until ice ball extended beyond lesion: Yes   Outcome: patient tolerated procedure well with no complications   Post-procedure details: wound care instructions given    Lentigines - Scattered tan macules - Due to sun exposure - Benign-appearing, observe - Recommend daily broad spectrum sunscreen SPF 30+ to sun-exposed areas, reapply every 2 hours as needed. - Call for any changes  Seborrheic Keratoses - Stuck-on, waxy,  tan-brown papules and/or plaques  - Benign-appearing - Discussed benign etiology and prognosis. - Observe - Call for any changes  Melanocytic Nevus - L post calf with PIPA - Tan-brown and/or pink-flesh-colored symmetric macules and papules - Benign appearing on exam today - Observation - Call clinic for new or changing moles - Recommend daily use of broad spectrum spf 30+ sunscreen to sun-exposed areas.   Hemangiomas - Red papules - Discussed benign nature - Observe - Call for any changes  Actinic Damage - Chronic condition, secondary to cumulative UV/sun exposure - diffuse scaly erythematous macules with underlying dyspigmentation - Recommend daily broad spectrum sunscreen SPF 30+ to sun-exposed areas, reapply every 2 hours as needed.  - Staying in the shade or wearing long sleeves, sun glasses (UVA+UVB protection) and wide brim hats (4-inch brim around the entire circumference of the hat) are also recommended for sun protection.  - Call for new or changing lesions.  History of Basal Cell Carcinoma of the Skin - No evidence of recurrence today - Recommend regular full body skin exams - Recommend daily broad spectrum sunscreen SPF 30+ to sun-exposed areas, reapply every 2 hours as needed.  - Call if any new or changing lesions are noted between office visits  Purpura - Chronic; persistent and recurrent.  Treatable, but not curable. - Violaceous macules and patches - Benign - Related to trauma, age, sun damage and/or use of blood thinners, chronic use of topical and/or oral steroids - Observe - Can use OTC arnica containing moisturizer such as Dermend Bruise Formula if desired - Call for  worsening or other concerns  Skin cancer screening performed today.  Return in about 1 year (around 10/11/2022) for TBSE.  Luther Redo, CMA, am acting as scribe for Sarina Ser, MD . Documentation: I have reviewed the above documentation for accuracy and completeness, and I agree  with the above.  Sarina Ser, MD

## 2021-10-10 NOTE — Patient Instructions (Signed)
Due to recent changes in healthcare laws, you may see results of your pathology and/or laboratory studies on MyChart before the doctors have had a chance to review them. We understand that in some cases there may be results that are confusing or concerning to you. Please understand that not all results are received at the same time and often the doctors may need to interpret multiple results in order to provide you with the best plan of care or course of treatment. Therefore, we ask that you please give us 2 business days to thoroughly review all your results before contacting the office for clarification. Should we see a critical lab result, you will be contacted sooner.   If You Need Anything After Your Visit  If you have any questions or concerns for your doctor, please call our main line at [phone] and press option 4 to reach your doctor's medical assistant. If no one answers, please leave a voicemail as directed and we will return your call as soon as possible. Messages left after 4 pm will be answered the following business day.   You may also send us a message via MyChart. We typically respond to MyChart messages within 1-2 business days.  For prescription refills, please ask your pharmacy to contact our office. Our fax number is [phone].  If you have an urgent issue when the clinic is closed that cannot wait until the next business day, you can page your doctor at the number below.    Please note that while we do our best to be available for urgent issues outside of office hours, we are not available 24/7.   If you have an urgent issue and are unable to reach us, you may choose to seek medical care at your doctor's office, retail clinic, urgent care center, or emergency room.  If you have a medical emergency, please immediately call 911 or go to the emergency department.  Pager Numbers  - Dr. Kowalski: [phone]  - Dr. Moye: [phone]  - Dr. Stewart:  [phone]  In the event of inclement weather, please call our main line at [phone] for an update on the status of any delays or closures.  Dermatology Medication Tips: Please keep the boxes that topical medications come in in order to help keep track of the instructions about where and how to use these. Pharmacies typically print the medication instructions only on the boxes and not directly on the medication tubes.   If your medication is too expensive, please contact our office at [phone] option 4 or send us a message through MyChart.   We are unable to tell what your co-pay for medications will be in advance as this is different depending on your insurance coverage. However, we may be able to find a substitute medication at lower cost or fill out paperwork to get insurance to cover a needed medication.   If a prior authorization is required to get your medication covered by your insurance company, please allow us 1-2 business days to complete this process.  Drug prices often vary depending on where the prescription is filled and some pharmacies may offer cheaper prices.  The website www.goodrx.com contains coupons for medications through different pharmacies. The prices here do not account for what the cost may be with help from insurance (it may be cheaper with your insurance), but the website can give you the price if you did not use any insurance.  - You can print the associated coupon and take it with   your prescription to the pharmacy.  - You may also stop by our office during regular business hours and pick up a GoodRx coupon card.  - If you need your prescription sent electronically to a different pharmacy, notify our office through [facility] MyChart or by phone at [phone] option 4.     Si Usted Necesita Algo Despus de Su Visita  Tambin puede enviarnos un mensaje a travs de MyChart. Por lo general respondemos a los mensajes de MyChart en el transcurso de 1 a 2  das hbiles.  Para renovar recetas, por favor pida a su farmacia que se ponga en contacto con nuestra oficina. Nuestro nmero de fax es el [phone].  Si tiene un asunto urgente cuando la clnica est cerrada y que no puede esperar hasta el siguiente da hbil, puede llamar/localizar a su doctor(a) al nmero que aparece a continuacin.   Por favor, tenga en cuenta que aunque hacemos todo lo posible para estar disponibles para asuntos urgentes fuera del horario de oficina, no estamos disponibles las 24 horas del da, los 7 das de la semana.   Si tiene un problema urgente y no puede comunicarse con nosotros, puede optar por buscar atencin mdica  en el consultorio de su doctor(a), en una clnica privada, en un centro de atencin urgente o en una sala de emergencias.  Si tiene una emergencia mdica, por favor llame inmediatamente al 911 o vaya a la sala de emergencias.  Nmeros de bper  - Dr. Kowalski: [phone]  - Dra. Moye: [phone]  - Dra. Stewart: [phone]  En caso de inclemencias del tiempo, por favor llame a nuestra lnea principal al [phone] para una actualizacin sobre el estado de cualquier retraso o cierre.  Consejos para la medicacin en dermatologa: Por favor, guarde las cajas en las que vienen los medicamentos de uso tpico para ayudarle a seguir las instrucciones sobre dnde y cmo usarlos. Las farmacias generalmente imprimen las instrucciones del medicamento slo en las cajas y no directamente en los tubos del medicamento.   Si su medicamento es muy caro, por favor, pngase en contacto con nuestra oficina llamando al [phone] y presione la opcin 4 o envenos un mensaje a travs de MyChart.   No podemos decirle cul ser su copago por los medicamentos por adelantado ya que esto es diferente dependiendo de la cobertura de su seguro. Sin embargo, es posible que podamos encontrar un medicamento sustituto a menor costo o llenar un formulario para que el  seguro cubra el medicamento que se considera necesario.   Si se requiere una autorizacin previa para que su compaa de seguros cubra su medicamento, por favor permtanos de 1 a 2 das hbiles para completar este proceso.  Los precios de los medicamentos varan con frecuencia dependiendo del lugar de dnde se surte la receta y alguna farmacias pueden ofrecer precios ms baratos.  El sitio web www.goodrx.com tiene cupones para medicamentos de diferentes farmacias. Los precios aqu no tienen en cuenta lo que podra costar con la ayuda del seguro (puede ser ms barato con su seguro), pero el sitio web puede darle el precio si no utiliz ningn seguro.  - Puede imprimir el cupn correspondiente y llevarlo con su receta a la farmacia.  - Tambin puede pasar por nuestra oficina durante el horario de atencin regular y recoger una tarjeta de cupones de GoodRx.  - Si necesita que su receta se enve electrnicamente a una farmacia diferente, informe a nuestra oficina a travs de MyChart de [place]   o por telfono llamando al [phone] y presione la opcin 4.  

## 2021-10-14 ENCOUNTER — Ambulatory Visit (INDEPENDENT_AMBULATORY_CARE_PROVIDER_SITE_OTHER): Payer: Medicare HMO | Admitting: Physician Assistant

## 2021-10-14 ENCOUNTER — Encounter: Payer: Self-pay | Admitting: Physician Assistant

## 2021-10-14 VITALS — BP 145/65 | HR 66 | Temp 97.7°F | Wt 195.8 lb

## 2021-10-14 DIAGNOSIS — N1831 Chronic kidney disease, stage 3a: Secondary | ICD-10-CM | POA: Diagnosis not present

## 2021-10-14 DIAGNOSIS — N4231 Prostatic intraepithelial neoplasia: Secondary | ICD-10-CM

## 2021-10-14 DIAGNOSIS — I10 Essential (primary) hypertension: Secondary | ICD-10-CM

## 2021-10-14 DIAGNOSIS — I2510 Atherosclerotic heart disease of native coronary artery without angina pectoris: Secondary | ICD-10-CM

## 2021-10-14 DIAGNOSIS — I2583 Coronary atherosclerosis due to lipid rich plaque: Secondary | ICD-10-CM

## 2021-10-14 DIAGNOSIS — Z125 Encounter for screening for malignant neoplasm of prostate: Secondary | ICD-10-CM | POA: Diagnosis not present

## 2021-10-14 DIAGNOSIS — E7800 Pure hypercholesterolemia, unspecified: Secondary | ICD-10-CM | POA: Diagnosis not present

## 2021-10-14 MED ORDER — BENAZEPRIL HCL 10 MG PO TABS: 10.0000 mg | ORAL_TABLET | Freq: Every day | ORAL | 1 refills | Status: DC

## 2021-10-14 MED ORDER — DUTASTERIDE 0.5 MG PO CAPS
0.5000 mg | ORAL_CAPSULE | Freq: Every day | ORAL | 0 refills | Status: DC
Start: 2021-10-14 — End: 2023-04-28

## 2021-10-14 MED ORDER — ATORVASTATIN CALCIUM 40 MG PO TABS: 40.0000 mg | ORAL_TABLET | Freq: Every day | ORAL | 1 refills | Status: AC

## 2021-10-14 MED ORDER — CLOPIDOGREL BISULFATE 75 MG PO TABS: 75.0000 mg | ORAL_TABLET | Freq: Every day | ORAL | 1 refills | Status: AC

## 2021-10-14 NOTE — Assessment & Plan Note (Signed)
Chronic, historic condition Currently taking Atorvastatin 40 mg and Clopidogrel 75 mg for management  Will recheck lipids today Follow up in 6 months for monitoring or sooner if concerns arise.

## 2021-10-14 NOTE — Assessment & Plan Note (Signed)
Chronic, historic condition Reports his BP seems well-controlled at this time and he is satisfied with treatment Is currently taking benazepril 10 mg PO QD  States he is staying active with numerous activities  Follow up in 6 months for monitoring or sooner if concerns arise.

## 2021-10-14 NOTE — Assessment & Plan Note (Signed)
Chronic, appears stable  Will recheck CMP today for monitoring  Recommend avoiding nephrotoxic substances and stay well hydrated Follow up as needed

## 2021-10-14 NOTE — Progress Notes (Signed)
Established Patient Office Visit  Name: Hector Woodard   MRN: [id number]    DOB: 01-05-1946   Date:10/14/2021  Today's Provider: Talitha Givens, MHS, PA-C Introduced myself to the patient as a PA-C and provided education on APPs in clinical practice.         Subjective  Chief Complaint  Chief Complaint  Patient presents with   Hypertension   Hyperlipidemia    Hypertension Pertinent negatives include no blurred vision, chest pain, headaches, palpitations or shortness of breath.  Hyperlipidemia Pertinent negatives include no chest pain or shortness of breath.    HYPERTENSION / HYPERLIPIDEMIA Satisfied with current treatment? yes Duration of hypertension: years BP monitoring frequency: a few times a month BP range: States it is usually okay  BP medication side effects: no Past BP meds: benazepril Duration of hyperlipidemia: chronic Cholesterol medication side effects: no Cholesterol supplements: fish oil Past cholesterol medications: atorvastain (lipitor) Medication compliance: excellent compliance Aspirin: yes Recent stressors: yes Recurrent headaches: no Visual changes: no Palpitations: no Dyspnea: no Chest pain: no Lower extremity edema: no Dizzy/lightheaded: no Reports he jogs and goes hiking often     Patient Active Problem List   Diagnosis Date Noted   Annual physical exam 04/16/2021   CKD (chronic kidney disease) stage 3, GFR 30-59 ml/min (HCC) 12/29/2019   Advanced care planning/counseling discussion 03/12/2017   CAD (coronary artery disease) 02/13/2016   Essential hypertension 02/12/2015   Hypercholesteremia 02/12/2015   PIN (prostatic intraepithelial neoplasia) 02/12/2015    Past Surgical History:  Procedure Laterality Date   CARDIAC CATHETERIZATION Left 02/28/2015   Procedure: Left Heart Cath and Coronary Angiography;  Surgeon: Teodoro Spray, MD;  Location: [facility];  Service: Cardiovascular;  Laterality: Left;   CARDIAC  CATHETERIZATION N/A 02/28/2015   Procedure: Coronary Stent Intervention;  Surgeon: Yolonda Kida, MD;  Location: [facility];  Service: Cardiovascular;  Laterality: N/A;   COLONOSCOPY     X2   KNEE ARTHROSCOPY WITH MEDIAL MENISECTOMY Right 08/23/2014   Procedure: KNEE ARTHROSCOPY WITH MEDIAL MENISECTOMY, plica excision and synovectomy;  Surgeon: Thornton Park, MD;  Location: [facility];  Service: Orthopedics;  Laterality: Right;   MOHS SURGERY  11/04/2018   right mid cheek, 11/18/18 left mid forehead    Family History  Problem Relation Age of Onset   Congestive Heart Failure Mother    Heart disease Father    Arrhythmia Sister    Mental illness Maternal Grandmother    Cancer Paternal Grandfather     Social History   Tobacco Use   Smoking status: Never   Smokeless tobacco: Never  Substance Use Topics   Alcohol use: Yes    Alcohol/week: 5.0 standard drinks of alcohol    Types: 5 Glasses of wine per week     Current Outpatient Medications:    aspirin EC 81 MG tablet, Take 81 mg by mouth daily., Disp: , Rfl:    Coenzyme Q10 (COQ-10) 100 MG CAPS, Take 1 capsule by mouth daily., Disp: , Rfl:    Multiple Vitamin (MULTIVITAMIN) capsule, Take 1 capsule by mouth daily., Disp: , Rfl:    Omega-3 Fatty Acids (FISH OIL) 1000 MG CAPS, Take 1 capsule by mouth at bedtime., Disp: , Rfl:    atorvastatin (LIPITOR) 40 MG tablet, Take 1 tablet (40 mg total) by mouth daily., Disp: 90 tablet, Rfl: 1   benazepril (LOTENSIN) 10 MG tablet, Take 1 tablet (10 mg total) by mouth  daily., Disp: 90 tablet, Rfl: 1   clopidogrel (PLAVIX) 75 MG tablet, Take 1 tablet (75 mg total) by mouth daily., Disp: 90 tablet, Rfl: 1   dutasteride (AVODART) 0.5 MG capsule, Take 1 capsule (0.5 mg total) by mouth daily., Disp: 30 capsule, Rfl: 0  Allergies  Allergen Reactions   Crestor [Rosuvastatin Calcium] Rash    Had started three new meds, few days later had a rash. Stopped the Crestor and rash is improved.  Other 2 meds were continued.    I personally reviewed active problem list, medication list, allergies, notes from last encounter, lab results with the patient/caregiver today.   Review of Systems  Eyes:  Negative for blurred vision and double vision.  Respiratory:  Negative for shortness of breath.   Cardiovascular:  Negative for chest pain, palpitations and leg swelling.  Gastrointestinal:  Negative for diarrhea, nausea and vomiting.  Musculoskeletal:  Positive for back pain.  Neurological:  Negative for dizziness and headaches.      Objective  Vitals:   10/14/21 0914 10/14/21 0921  BP: (!) 144/72 (!) 145/65  Pulse: (!) 56 66  Temp: 97.7 F (36.5 C)   TempSrc: Oral   SpO2: 98%   Weight: 195 lb 12.8 oz (88.8 kg)     Body mass index is 26.81 kg/m.  Physical Exam Vitals reviewed.  Constitutional:      General: He is awake.     Appearance: Normal appearance. He is well-developed, well-groomed and normal weight.  HENT:     Head: Normocephalic and atraumatic.  Cardiovascular:     Rate and Rhythm: Normal rate and regular rhythm.     Pulses: Normal pulses.          Radial pulses are 2+ on the right side and 2+ on the left side.     Heart sounds: Normal heart sounds.  Pulmonary:     Effort: Pulmonary effort is normal.     Breath sounds: Normal breath sounds. No decreased air movement. No decreased breath sounds, wheezing, rhonchi or rales.  Musculoskeletal:     Right lower leg: No edema.     Left lower leg: No edema.  Neurological:     Mental Status: He is alert.  Psychiatric:        Attention and Perception: Attention and perception normal.        Mood and Affect: Mood and affect normal.        Speech: Speech normal.        Behavior: Behavior normal. Behavior is cooperative.        Thought Content: Thought content normal.        Cognition and Memory: Cognition normal.      No results found for this or any previous visit (from the past 2160  hour(s)).   PHQ2/9:    10/14/2021    9:22 AM 04/16/2021    3:09 PM 09/12/2020    8:17 AM 06/23/2019    9:45 AM 06/02/2018    1:12 PM  Depression screen PHQ 2/9  Decreased Interest 0 0 0 0 0  Down, Depressed, Hopeless 0 0 0 0 0  PHQ - 2 Score 0 0 0 0 0  Altered sleeping 0 0 0    Tired, decreased energy 0 1 0    Change in appetite 0 1 0    Feeling bad or failure about yourself  0 0 0    Trouble concentrating 0 0 0    Moving slowly or fidgety/restless 0 0  0    Suicidal thoughts 0 0 0    PHQ-9 Score 0 2 0    Difficult doing work/chores Not difficult at all Not difficult at all         Fall Risk:    10/14/2021    9:22 AM 04/16/2021    3:09 PM 09/12/2020    8:16 AM 06/23/2019    9:45 AM 06/02/2018    1:12 PM  [place] in the past year? 0 0 0 0 0  Number falls in past yr: 0 0 0 0   Injury with Fall? 0 0 0 0   Risk for fall due to : No Fall Risks No Fall Risks No Fall Risks    Follow up Falls evaluation completed Falls evaluation completed Falls evaluation completed        Functional Status Survey:      Assessment & Plan  Problem List Items Addressed This Visit       Cardiovascular and Mediastinum   Essential hypertension    Chronic, historic condition Reports his BP seems well-controlled at this time and he is satisfied with treatment Is currently taking benazepril 10 mg PO QD  States he is staying active with numerous activities  Follow up in 6 months for monitoring or sooner if concerns arise.      Relevant Medications   atorvastatin (LIPITOR) 40 MG tablet   benazepril (LOTENSIN) 10 MG tablet   Other Relevant Orders   Comp Met (CMET)   CBC w/Diff   TSH   CAD (coronary artery disease)    Chronic, historic condition Currently taking Atorvastatin 40 mg and Clopidogrel 75 mg for management  Will recheck lipids today Follow up in 6 months for monitoring or sooner if concerns arise.       Relevant Medications   atorvastatin (LIPITOR) 40 MG tablet    benazepril (LOTENSIN) 10 MG tablet   clopidogrel (PLAVIX) 75 MG tablet   Other Relevant Orders   Lipid Profile     Genitourinary   PIN (prostatic intraepithelial neoplasia)   Relevant Orders   PSA   CKD (chronic kidney disease) stage 3, GFR 30-59 ml/min (HCC) - Primary    Chronic, appears stable  Will recheck CMP today for monitoring  Recommend avoiding nephrotoxic substances and stay well hydrated Follow up as needed        Relevant Orders   Comp Met (CMET)     Other   Hypercholesteremia    Chronic, historic condition Takin Atorvastatin 40 mg PO QD and appears to be tolerating well Continue medications  Will recheck lipid panel today - results to dictate further management Follow up in 6 months or sooner if concerns arise.        Relevant Medications   atorvastatin (LIPITOR) 40 MG tablet   benazepril (LOTENSIN) 10 MG tablet   Other Relevant Orders   Lipid Profile     Return in about 6 months (around 04/16/2022) for HTN, HLD.   I, Lonita Debes E Carman Auxier, PA-C, have reviewed all documentation for this visit. The documentation on 10/14/21 for the exam, diagnosis, procedures, and orders are all accurate and complete.   Talitha Givens, MHS, PA-C [facility]

## 2021-10-14 NOTE — Assessment & Plan Note (Signed)
Chronic, historic condition Takin Atorvastatin 40 mg PO QD and appears to be tolerating well Continue medications  Will recheck lipid panel today - results to dictate further management Follow up in 6 months or sooner if concerns arise.

## 2021-10-15 LAB — LIPID PANEL
Chol/HDL Ratio: 2.1 ratio (ref 0.0–5.0)
Cholesterol, Total: 166 mg/dL (ref 100–199)
HDL: 78 mg/dL (ref >39)
LDL Chol Calc (NIH): 72 mg/dL (ref 0–99)
Triglycerides: 88 mg/dL (ref 0–149)
VLDL Cholesterol Cal: 16 mg/dL (ref 5–40)

## 2021-10-15 LAB — CBC WITH DIFFERENTIAL/PLATELET
Basophils Absolute: 0 10*3/uL (ref 0.0–0.2)
Basos: 1 %
EOS (ABSOLUTE): 0.1 10*3/uL (ref 0.0–0.4)
Eos: 2 %
Hematocrit: 46 % (ref 37.5–51.0)
Hemoglobin: 15.1 g/dL (ref 13.0–17.7)
Immature Grans (Abs): 0 10*3/uL (ref 0.0–0.1)
Immature Granulocytes: 0 %
Lymphocytes Absolute: 2 10*3/uL (ref 0.7–3.1)
Lymphs: 39 %
MCH: 29.8 pg (ref 26.6–33.0)
MCHC: 32.8 g/dL (ref 31.5–35.7)
MCV: 91 fL (ref 79–97)
Monocytes Absolute: 0.4 10*3/uL (ref 0.1–0.9)
Monocytes: 8 %
Neutrophils Absolute: 2.6 10*3/uL (ref 1.4–7.0)
Neutrophils: 50 %
Platelets: 208 10*3/uL (ref 150–450)
RBC: 5.06 x10E6/uL (ref 4.14–5.80)
RDW: 12.9 % (ref 11.6–15.4)
WBC: 5.1 10*3/uL (ref 3.4–10.8)

## 2021-10-15 LAB — COMPREHENSIVE METABOLIC PANEL
ALT: 17 IU/L (ref 0–44)
AST: 22 IU/L (ref 0–40)
Albumin/Globulin Ratio: 2 (ref 1.2–2.2)
Albumin: 4.5 g/dL (ref 3.8–4.8)
Alkaline Phosphatase: 47 IU/L (ref 44–121)
BUN/Creatinine Ratio: 14 (ref 10–24)
BUN: 17 mg/dL (ref 8–27)
Bilirubin Total: 0.8 mg/dL (ref 0.0–1.2)
CO2: 24 mmol/L (ref 20–29)
Calcium: 9.4 mg/dL (ref 8.6–10.2)
Chloride: 104 mmol/L (ref 96–106)
Creatinine, Ser: 1.23 mg/dL (ref 0.76–1.27)
Globulin, Total: 2.3 g/dL (ref 1.5–4.5)
Glucose: 104 mg/dL — ABNORMAL HIGH (ref 70–99)
Potassium: 4.4 mmol/L (ref 3.5–5.2)
Sodium: 141 mmol/L (ref 134–144)
Total Protein: 6.8 g/dL (ref 6.0–8.5)
eGFR: 62 mL/min/{1.73_m2} (ref >59)

## 2021-10-15 LAB — TSH: TSH: 3.12 u[IU]/mL (ref 0.450–4.500)

## 2021-10-15 LAB — PSA: Prostate Specific Ag, Serum: 2.5 ng/mL (ref 0.0–4.0)

## 2021-10-16 DIAGNOSIS — I25118 Atherosclerotic heart disease of native coronary artery with other forms of angina pectoris: Secondary | ICD-10-CM | POA: Diagnosis not present

## 2021-10-19 ENCOUNTER — Encounter: Payer: Self-pay | Admitting: Dermatology

## 2021-10-21 DIAGNOSIS — N5201 Erectile dysfunction due to arterial insufficiency: Secondary | ICD-10-CM | POA: Diagnosis not present

## 2021-10-21 DIAGNOSIS — N401 Enlarged prostate with lower urinary tract symptoms: Secondary | ICD-10-CM | POA: Diagnosis not present

## 2021-11-12 DIAGNOSIS — M5451 Vertebrogenic low back pain: Secondary | ICD-10-CM | POA: Diagnosis not present

## 2021-11-12 DIAGNOSIS — M461 Sacroiliitis, not elsewhere classified: Secondary | ICD-10-CM | POA: Diagnosis not present

## 2021-11-12 DIAGNOSIS — M9904 Segmental and somatic dysfunction of sacral region: Secondary | ICD-10-CM | POA: Diagnosis not present

## 2021-11-12 DIAGNOSIS — M9903 Segmental and somatic dysfunction of lumbar region: Secondary | ICD-10-CM | POA: Diagnosis not present

## 2021-11-14 DIAGNOSIS — M5451 Vertebrogenic low back pain: Secondary | ICD-10-CM | POA: Diagnosis not present

## 2021-11-14 DIAGNOSIS — M461 Sacroiliitis, not elsewhere classified: Secondary | ICD-10-CM | POA: Diagnosis not present

## 2021-11-14 DIAGNOSIS — M9904 Segmental and somatic dysfunction of sacral region: Secondary | ICD-10-CM | POA: Diagnosis not present

## 2021-11-14 DIAGNOSIS — M9903 Segmental and somatic dysfunction of lumbar region: Secondary | ICD-10-CM | POA: Diagnosis not present

## 2021-11-19 DIAGNOSIS — E782 Mixed hyperlipidemia: Secondary | ICD-10-CM | POA: Diagnosis not present

## 2021-11-19 DIAGNOSIS — I25118 Atherosclerotic heart disease of native coronary artery with other forms of angina pectoris: Secondary | ICD-10-CM | POA: Diagnosis not present

## 2021-11-19 DIAGNOSIS — Z125 Encounter for screening for malignant neoplasm of prostate: Secondary | ICD-10-CM | POA: Diagnosis not present

## 2021-11-19 DIAGNOSIS — I10 Essential (primary) hypertension: Secondary | ICD-10-CM | POA: Diagnosis not present

## 2021-11-19 DIAGNOSIS — Z7689 Persons encountering health services in other specified circumstances: Secondary | ICD-10-CM | POA: Diagnosis not present

## 2022-03-06 DIAGNOSIS — H5213 Myopia, bilateral: Secondary | ICD-10-CM | POA: Diagnosis not present

## 2022-03-06 DIAGNOSIS — H524 Presbyopia: Secondary | ICD-10-CM | POA: Diagnosis not present

## 2022-03-06 DIAGNOSIS — H25813 Combined forms of age-related cataract, bilateral: Secondary | ICD-10-CM | POA: Diagnosis not present

## 2022-03-06 DIAGNOSIS — H52203 Unspecified astigmatism, bilateral: Secondary | ICD-10-CM | POA: Diagnosis not present

## 2022-04-08 DIAGNOSIS — E782 Mixed hyperlipidemia: Secondary | ICD-10-CM | POA: Diagnosis not present

## 2022-04-08 DIAGNOSIS — I10 Essential (primary) hypertension: Secondary | ICD-10-CM | POA: Diagnosis not present

## 2022-04-08 DIAGNOSIS — R739 Hyperglycemia, unspecified: Secondary | ICD-10-CM | POA: Diagnosis not present

## 2022-04-08 DIAGNOSIS — J019 Acute sinusitis, unspecified: Secondary | ICD-10-CM | POA: Diagnosis not present

## 2022-04-08 DIAGNOSIS — Z125 Encounter for screening for malignant neoplasm of prostate: Secondary | ICD-10-CM | POA: Diagnosis not present

## 2022-04-10 ENCOUNTER — Other Ambulatory Visit: Payer: Self-pay | Admitting: Physician Assistant

## 2022-04-10 DIAGNOSIS — I10 Essential (primary) hypertension: Secondary | ICD-10-CM

## 2022-04-10 NOTE — Telephone Encounter (Signed)
Requested Prescriptions  Pending Prescriptions Disp Refills   benazepril (LOTENSIN) 10 MG tablet [Pharmacy Med Name: BENAZEPRIL HCL 10 MG TABLET] 90 tablet 1    Sig: TAKE 1 TABLET BY MOUTH EVERY DAY     Cardiovascular:  ACE Inhibitors Failed - 04/10/2022  1:38 AM      Failed - Last BP in normal range    BP Readings from Last 1 Encounters:  10/14/21 (!) 145/65         Passed - Cr in normal range and within 180 days    Creatinine, Ser  Date Value Ref Range Status  10/14/2021 1.23 0.76 - 1.27 mg/dL Final         Passed - K in normal range and within 180 days    Potassium  Date Value Ref Range Status  10/14/2021 4.4 3.5 - 5.2 mmol/L Final         Passed - Patient is not pregnant      Passed - Valid encounter within last 6 months    Recent Outpatient Visits           5 months ago Stage 3a chronic kidney disease ([place])   [facility] Mecum, Erin E, PA-C   11 months ago Annual physical exam   [facility] Vigg, Avanti, MD   1 year ago Stage 3a chronic kidney disease ([place])   [facility] Vigg, Avanti, MD   2 years ago Essential hypertension   [place] Kathrine Haddock, NP   2 years ago Essential hypertension   [place], Lilia Argue, [place]       Future Appointments             In 6 months Nehemiah Massed, Monia Sabal, MD [place]

## 2022-04-15 DIAGNOSIS — N400 Enlarged prostate without lower urinary tract symptoms: Secondary | ICD-10-CM | POA: Diagnosis not present

## 2022-04-15 DIAGNOSIS — I25118 Atherosclerotic heart disease of native coronary artery with other forms of angina pectoris: Secondary | ICD-10-CM | POA: Diagnosis not present

## 2022-04-15 DIAGNOSIS — Z0000 Encounter for general adult medical examination without abnormal findings: Secondary | ICD-10-CM | POA: Diagnosis not present

## 2022-04-15 DIAGNOSIS — Z1331 Encounter for screening for depression: Secondary | ICD-10-CM | POA: Diagnosis not present

## 2022-04-15 DIAGNOSIS — I10 Essential (primary) hypertension: Secondary | ICD-10-CM | POA: Diagnosis not present

## 2022-04-17 ENCOUNTER — Ambulatory Visit: Payer: Medicare HMO | Admitting: Nurse Practitioner

## 2022-05-14 ENCOUNTER — Other Ambulatory Visit: Payer: Self-pay | Admitting: Physician Assistant

## 2022-05-14 DIAGNOSIS — I2510 Atherosclerotic heart disease of native coronary artery without angina pectoris: Secondary | ICD-10-CM

## 2022-05-14 DIAGNOSIS — E7800 Pure hypercholesterolemia, unspecified: Secondary | ICD-10-CM

## 2022-05-14 NOTE — Telephone Encounter (Signed)
Requested medication (s) are due for refill today - yes  Requested medication (s) are on the active medication list -yes  Future visit scheduled -no  Last refill: 10/14/21  Notes to clinic: not sure that patient is still  patient at office- sent for review before refusing Rx RF request   Requested Prescriptions  Pending Prescriptions Disp Refills   atorvastatin (LIPITOR) 40 MG tablet [Pharmacy Med Name: ATORVASTATIN 40 MG TABLET] 90 tablet 1    Sig: TAKE 1 TABLET BY MOUTH EVERY DAY     Cardiovascular:  Antilipid - Statins Failed - 05/14/2022  2:50 AM      Failed - Lipid Panel in normal range within the last 12 months    Cholesterol, Total  Date Value Ref Range Status  10/14/2021 166 100 - 199 mg/dL Final   Cholesterol Piccolo, Waived  Date Value Ref Range Status  11/30/2018 175 <200 mg/dL Final    Comment:                            Desirable                <200                         Borderline High      200- 239                         High                     >239    LDL Chol Calc (NIH)  Date Value Ref Range Status  10/14/2021 72 0 - 99 mg/dL Final   LDL-C  Date Value Ref Range Status  04/24/2015 CANCELED mg/dL     Comment:    Unable to calculate result since non-numeric result obtained for component test.                           Optimal               <  100                           Above optimal     100 -  129                           Borderline        130 -  159                           High              160 -  189                           Very high             >  189 LDL-C is inaccurate if patient is non-fasting.  Result canceled by the ancillary    HDL Cholesterol by NMR  Date Value Ref Range Status  04/24/2015 CANCELED      Comment:    Test not performed  Result canceled by the ancillary    HDL  Date Value Ref Range Status  10/14/2021 78 >39 mg/dL Final   Triglycerides  Date Value Ref Range Status  10/14/2021 88 0 - 149 mg/dL Final    Triglycerides by NMR  Date Value Ref Range Status  04/24/2015 CANCELED      Comment:    Test not performed  Result canceled by the ancillary    Triglycerides Piccolo,Waived  Date Value Ref Range Status  11/30/2018 74 <150 mg/dL Final    Comment:                            Normal                   <150                         Borderline High     150 - 199                         High                200 - 499                         Very High                >499          Passed - Patient is not pregnant      Passed - Valid encounter within last 12 months    Recent Outpatient Visits           7 months ago Stage 3a chronic kidney disease ([place])   [place] [facility] Mecum, Dani Gobble, PA-C   1 year ago Annual physical exam   [place] [facility], Avanti, MD   1 year ago Stage 3a chronic kidney disease ([place])   [place] Vigg, Avanti, MD   2 years ago Essential hypertension   [place] Malachy Mood, NP   2 years ago Essential hypertension   [facility]ney [place], [place]       Future Appointments             In 4 months Ralene Bathe, MD [place]   In 4 months Stoioff, Ronda Fairly, MD [facility] [place]             clopidogrel (PLAVIX) 75 MG tablet [Pharmacy Med Name: CLOPIDOGREL 75 MG TABLET] 90 tablet 1    Sig: TAKE 1 [place]     Hematology: Antiplatelets - clopidogrel Failed - 05/14/2022  2:50 AM      Failed - HCT in normal range and within 180 days    Hematocrit  Date Value Ref Range Status  10/14/2021 46.0 37.5 - 51.0 % Final         Failed - HGB in normal range and within 180 days    Hemoglobin  Date Value Ref Range Status  10/14/2021 15.1 13.0 - 17.7 g/dL Final         Failed - PLT in normal range and within 180 days    Platelets  Date Value Ref Range Status   10/14/2021 208 150 - 450 x10E3/uL Final         Failed - Valid encounter within last 6 months  Recent Outpatient Visits           7 months ago Stage 3a chronic kidney disease ([place])   [place] [facility] Mecum, Dani Gobble, PA-C   1 year ago Annual physical exam   [place] Vigg, Avanti, MD   1 year ago Stage 3a chronic kidney disease ([place])   [place] Vigg, Avanti, MD   2 years ago Essential hypertension   [place] Kathrine Haddock, NP   2 years ago Essential hypertension   [place] [facility]ney [facility], [place]       Future Appointments             In 4 months Ralene Bathe, MD [place]   In 4 months Stoioff, Ronda Fairly, MD [facility] [place]            Passed - Cr in normal range and within 360 days    Creatinine, Ser  Date Value Ref Range Status  10/14/2021 1.23 0.76 - 1.27 mg/dL Final            Requested Prescriptions  Pending Prescriptions Disp Refills   atorvastatin (LIPITOR) 40 MG tablet [Pharmacy Med Name: ATORVASTATIN 40 MG TABLET] 90 tablet 1    Sig: TAKE 1 TABLET BY MOUTH EVERY DAY     Cardiovascular:  Antilipid - Statins Failed - 05/14/2022  2:50 AM      Failed - Lipid Panel in normal range within the last 12 months    Cholesterol, Total  Date Value Ref Range Status  10/14/2021 166 100 - 199 mg/dL Final   Cholesterol Piccolo, Waived  Date Value Ref Range Status  11/30/2018 175 <200 mg/dL Final    Comment:                            Desirable                <200                         Borderline High      200- 239                         High                     >239    LDL Chol Calc (NIH)  Date Value Ref Range Status  10/14/2021 72 0 - 99 mg/dL Final   LDL-C  Date Value Ref Range Status  04/24/2015 CANCELED mg/dL     Comment:    Unable to calculate result since non-numeric  result obtained for component test.                           Optimal               <  100                           Above optimal     100 -  129                           Borderline  130 -  159                           High              160 -  189                           Very high             >  189 LDL-C is inaccurate if patient is non-fasting.  Result canceled by the ancillary    HDL Cholesterol by NMR  Date Value Ref Range Status  04/24/2015 CANCELED      Comment:    Test not performed  Result canceled by the ancillary    HDL  Date Value Ref Range Status  10/14/2021 78 >39 mg/dL Final   Triglycerides  Date Value Ref Range Status  10/14/2021 88 0 - 149 mg/dL Final   Triglycerides by NMR  Date Value Ref Range Status  04/24/2015 CANCELED      Comment:    Test not performed  Result canceled by the ancillary    Triglycerides Piccolo,Waived  Date Value Ref Range Status  11/30/2018 74 <150 mg/dL Final    Comment:                            Normal                   <150                         Borderline High     150 - 199                         High                200 - 499                         Very High                >499          Passed - Patient is not pregnant      Passed - Valid encounter within last 12 months    Recent Outpatient Visits           7 months ago Stage 3a chronic kidney disease ([place])   [place] [facility] Mecum, Dani Gobble, PA-C   1 year ago Annual physical exam   [place] Vigg, Avanti, MD   1 year ago Stage 3a chronic kidney disease ([place])   [place] Vigg, Avanti, MD   2 years ago Essential hypertension   [place] Kathrine Haddock, NP   2 years ago Essential hypertension   [place] [facility]ney [place], [place]       Future Appointments             In 4 months Ralene Bathe, MD [place]   In 4 months Stoioff, Ronda Fairly, MD [facility] [place]             clopidogrel (PLAVIX) 75 MG tablet [Pharmacy Med Name: CLOPIDOGREL 75 MG TABLET] 90 tablet 1  Sig: TAKE 1 TABLET BY MOUTH EVERY DAY     Hematology: Antiplatelets - clopidogrel Failed - 05/14/2022  2:50 AM      Failed - HCT in normal range and within 180 days    Hematocrit  Date Value Ref Range Status  10/14/2021 46.0 37.5 - 51.0 % Final         Failed - HGB in normal range and within 180 days    Hemoglobin  Date Value Ref Range Status  10/14/2021 15.1 13.0 - 17.7 g/dL Final         Failed - PLT in normal range and within 180 days    Platelets  Date Value Ref Range Status  10/14/2021 208 150 - 450 x10E3/uL Final         Failed - Valid encounter within last 6 months    Recent Outpatient Visits           7 months ago Stage 3a chronic kidney disease ([place])   [place] [facility] Mecum, Dani Gobble, PA-C   1 year ago Annual physical exam   [place] Vigg, Avanti, MD   1 year ago Stage 3a chronic kidney disease ([place])   [place] Vigg, Avanti, MD   2 years ago Essential hypertension   [place] Kathrine Haddock, NP   2 years ago Essential hypertension    [facility] [place], [place]       Future Appointments             In 4 months Ralene Bathe, MD [place]   In 4 months Stoioff, Ronda Fairly, MD [facility] [place]            Passed - Cr in normal range and within 360 days    Creatinine, Ser  Date Value Ref Range Status  10/14/2021 1.23 0.76 - 1.27 mg/dL Final

## 2022-06-16 ENCOUNTER — Other Ambulatory Visit: Payer: Self-pay | Admitting: Physician Assistant

## 2022-06-16 DIAGNOSIS — E7800 Pure hypercholesterolemia, unspecified: Secondary | ICD-10-CM

## 2022-06-17 NOTE — Telephone Encounter (Signed)
Requested medication (s) are due for refill today - yes  Requested medication (s) are on the active medication list -yes  Future visit scheduled -no  Last refill: 10/14/21 #90 1RF  Notes to clinic: no provider listed- former Vigg patient  Requested Prescriptions  Pending Prescriptions Disp Refills   atorvastatin (LIPITOR) 40 MG tablet [Pharmacy Med Name: ATORVASTATIN 40 MG TABLET] 90 tablet 1    Sig: TAKE 1 TABLET BY MOUTH EVERY DAY     Cardiovascular:  Antilipid - Statins Failed - 06/16/2022  2:46 PM      Failed - Lipid Panel in normal range within the last 12 months    Cholesterol, Total  Date Value Ref Range Status  10/14/2021 166 100 - 199 mg/dL Final   Cholesterol Piccolo, Waived  Date Value Ref Range Status  11/30/2018 175 <200 mg/dL Final    Comment:                            Desirable                <200                         Borderline High      200- 239                         High                     >239    LDL Chol Calc (NIH)  Date Value Ref Range Status  10/14/2021 72 0 - 99 mg/dL Final   LDL-C  Date Value Ref Range Status  04/24/2015 CANCELED mg/dL     Comment:    Unable to calculate result since non-numeric result obtained for component test.                           Optimal               <  100                           Above optimal     100 -  129                           Borderline        130 -  159                           High              160 -  189                           Very high             >  189 LDL-C is inaccurate if patient is non-fasting.  Result canceled by the ancillary    HDL Cholesterol by NMR  Date Value Ref Range Status  04/24/2015 CANCELED      Comment:    Test not performed  Result canceled by the ancillary    HDL  Date Value Ref Range Status  10/14/2021 78 >39 mg/dL Final   Triglycerides  Date Value  Ref Range Status  10/14/2021 88 0 - 149 mg/dL Final   Triglycerides by NMR  Date Value Ref Range Status   04/24/2015 CANCELED      Comment:    Test not performed  Result canceled by the ancillary    Triglycerides Piccolo,Waived  Date Value Ref Range Status  11/30/2018 74 <150 mg/dL Final    Comment:                            Normal                   <150                         Borderline High     150 - 199                         High                200 - 499                         Very High                >499          Passed - Patient is not pregnant      Passed - Valid encounter within last 12 months    Recent Outpatient Visits           8 months ago Stage 3a chronic kidney disease ([place])   [place] [facility] Mecum, Dani Gobble, PA-C   1 year ago Annual physical exam   [place] Vigg, Avanti, MD   1 year ago Stage 3a chronic kidney disease ([place])   [place] Vigg, Avanti, MD   2 years ago Essential hypertension   [place] Kathrine Haddock, NP   2 years ago Essential hypertension   [place] [facility] [place], [place]       Future Appointments             In 3 months Ralene Bathe, MD [place]   In 3 months Stoioff, Ronda Fairly, MD [facility] [place]               Requested Prescriptions  Pending Prescriptions Disp Refills   atorvastatin (LIPITOR) 40 MG tablet [Pharmacy Med Name: ATORVASTATIN 40 MG TABLET] 90 tablet 1    Sig: TAKE 1 TABLET BY MOUTH EVERY DAY     Cardiovascular:  Antilipid - Statins Failed - 06/16/2022  2:46 PM      Failed - Lipid Panel in normal range within the last 12 months    Cholesterol, Total  Date Value Ref Range Status  10/14/2021 166 100 - 199 mg/dL Final   Cholesterol Piccolo, Waived  Date Value Ref Range Status  11/30/2018 175 <200 mg/dL Final    Comment:                            Desirable                <200  Borderline High      200- 239                          High                     >239    LDL Chol Calc (NIH)  Date Value Ref Range Status  10/14/2021 72 0 - 99 mg/dL Final   LDL-C  Date Value Ref Range Status  04/24/2015 CANCELED mg/dL     Comment:    Unable to calculate result since non-numeric result obtained for component test.                           Optimal               <  100                           Above optimal     100 -  129                           Borderline        130 -  159                           High              160 -  189                           Very high             >  189 LDL-C is inaccurate if patient is non-fasting.  Result canceled by the ancillary    HDL Cholesterol by NMR  Date Value Ref Range Status  04/24/2015 CANCELED      Comment:    Test not performed  Result canceled by the ancillary    HDL  Date Value Ref Range Status  10/14/2021 78 >39 mg/dL Final   Triglycerides  Date Value Ref Range Status  10/14/2021 88 0 - 149 mg/dL Final   Triglycerides by NMR  Date Value Ref Range Status  04/24/2015 CANCELED      Comment:    Test not performed  Result canceled by the ancillary    Triglycerides Piccolo,Waived  Date Value Ref Range Status  11/30/2018 74 <150 mg/dL Final    Comment:                            Normal                   <150                         Borderline High     150 - 199                         High                200 - 499                         Very High                >  27          Passed - Patient is not pregnant      Passed - Valid encounter within last 12 months    Recent Outpatient Visits           8 months ago Stage 3a chronic kidney disease ([place])   [place] Mecum, Dani Gobble, PA-C   1 year ago Annual physical exam   [place] Vigg, Avanti, MD   1 year ago Stage 3a chronic kidney disease ([place])   [place] Vigg, Avanti, MD   2 years ago  Essential hypertension   [place] Kathrine Haddock, NP   2 years ago Essential hypertension   [place] [facility] [place], [place]       Future Appointments             In 3 months Nehemiah Massed, Monia Sabal, MD [place]   In 3 months Stoioff, Ronda Fairly, MD [place]

## 2022-08-26 DIAGNOSIS — M5451 Vertebrogenic low back pain: Secondary | ICD-10-CM | POA: Diagnosis not present

## 2022-08-26 DIAGNOSIS — M9904 Segmental and somatic dysfunction of sacral region: Secondary | ICD-10-CM | POA: Diagnosis not present

## 2022-08-26 DIAGNOSIS — M461 Sacroiliitis, not elsewhere classified: Secondary | ICD-10-CM | POA: Diagnosis not present

## 2022-08-26 DIAGNOSIS — M9903 Segmental and somatic dysfunction of lumbar region: Secondary | ICD-10-CM | POA: Diagnosis not present

## 2022-10-08 ENCOUNTER — Encounter: Payer: Self-pay | Admitting: Dermatology

## 2022-10-08 ENCOUNTER — Ambulatory Visit: Payer: Medicare HMO | Admitting: Dermatology

## 2022-10-08 VITALS — BP 108/64

## 2022-10-08 DIAGNOSIS — L578 Other skin changes due to chronic exposure to nonionizing radiation: Secondary | ICD-10-CM

## 2022-10-08 DIAGNOSIS — Z85828 Personal history of other malignant neoplasm of skin: Secondary | ICD-10-CM

## 2022-10-08 DIAGNOSIS — C44712 Basal cell carcinoma of skin of right lower limb, including hip: Secondary | ICD-10-CM | POA: Diagnosis not present

## 2022-10-08 DIAGNOSIS — Z86018 Personal history of other benign neoplasm: Secondary | ICD-10-CM

## 2022-10-08 DIAGNOSIS — D1801 Hemangioma of skin and subcutaneous tissue: Secondary | ICD-10-CM

## 2022-10-08 DIAGNOSIS — L570 Actinic keratosis: Secondary | ICD-10-CM

## 2022-10-08 DIAGNOSIS — L820 Inflamed seborrheic keratosis: Secondary | ICD-10-CM

## 2022-10-08 DIAGNOSIS — W908XXA Exposure to other nonionizing radiation, initial encounter: Secondary | ICD-10-CM | POA: Diagnosis not present

## 2022-10-08 DIAGNOSIS — L821 Other seborrheic keratosis: Secondary | ICD-10-CM | POA: Diagnosis not present

## 2022-10-08 DIAGNOSIS — L814 Other melanin hyperpigmentation: Secondary | ICD-10-CM

## 2022-10-08 DIAGNOSIS — I10 Essential (primary) hypertension: Secondary | ICD-10-CM | POA: Diagnosis not present

## 2022-10-08 DIAGNOSIS — D492 Neoplasm of unspecified behavior of bone, soft tissue, and skin: Secondary | ICD-10-CM

## 2022-10-08 DIAGNOSIS — Z1283 Encounter for screening for malignant neoplasm of skin: Secondary | ICD-10-CM

## 2022-10-08 DIAGNOSIS — Z872 Personal history of diseases of the skin and subcutaneous tissue: Secondary | ICD-10-CM

## 2022-10-08 DIAGNOSIS — D229 Melanocytic nevi, unspecified: Secondary | ICD-10-CM

## 2022-10-08 DIAGNOSIS — I25118 Atherosclerotic heart disease of native coronary artery with other forms of angina pectoris: Secondary | ICD-10-CM | POA: Diagnosis not present

## 2022-10-08 DIAGNOSIS — E782 Mixed hyperlipidemia: Secondary | ICD-10-CM | POA: Diagnosis not present

## 2022-10-08 HISTORY — DX: Personal history of other malignant neoplasm of skin: Z85.828

## 2022-10-08 NOTE — Progress Notes (Signed)
Follow-Up Visit   Subjective  Hector Woodard is a 77 y.o. male who presents for the following: Skin Cancer Screening and Full Body Skin Exam, hx of BCC, Aks, Dysplastic nevus, check spots R calf, R arm, one on R calf has bled in past with bath brush The patient presents for Total-Body Skin Exam (TBSE) for skin cancer screening and mole check. The patient has spots, moles and lesions to be evaluated, some may be new or changing and the patient may have concern these could be cancer.  The following portions of the chart were reviewed this encounter and updated as appropriate: medications, allergies, medical history  Review of Systems:  No other skin or systemic complaints except as noted in HPI or Assessment and Plan.  Objective  Well appearing patient in no apparent distress; mood and affect are within normal limits.  A full examination was performed including scalp, head, eyes, ears, nose, lips, neck, chest, axillae, abdomen, back, buttocks, bilateral upper extremities, bilateral lower extremities, hands, feet, fingers, toes, fingernails, and toenails. All findings within normal limits unless otherwise noted below.   Relevant physical exam findings are noted in the Assessment and Plan.  R cheek x 1 Pink scaly macules  R forearm x 1 Stuck on waxy paps with erythema  R medial calf Scaly pap 1.5cm         Assessment & Plan    LENTIGINES, SEBORRHEIC KERATOSES, HEMANGIOMAS - Benign normal skin lesions - Benign-appearing - Call for any changes  MELANOCYTIC NEVI - Tan-brown and/or pink-flesh-colored symmetric macules and papules - Benign appearing on exam today - Observation - Call clinic for new or changing moles - Recommend daily use of broad spectrum spf 30+ sunscreen to sun-exposed areas.   HISTORY OF DYSPLASTIC NEVUS No evidence of recurrence today Recommend regular full body skin exams Recommend daily broad spectrum sunscreen SPF 30+ to sun-exposed areas, reapply  every 2 hours as needed.  Call if any new or changing lesions are noted between office visits  - L post calf  HISTORY OF BASAL CELL CARCINOMA OF THE SKIN - No evidence of recurrence today - Recommend regular full body skin exams - Recommend daily broad spectrum sunscreen SPF 30+ to sun-exposed areas, reapply every 2 hours as needed.  - Call if any new or changing lesions are noted between office visits  - R mid cheek, L mid forehead  AK (actinic keratosis) R cheek x 1  Destruction of lesion - R cheek x 1 Complexity: simple   Destruction method: cryotherapy   Informed consent: discussed and consent obtained   Timeout:  patient name, date of birth, surgical site, and procedure verified Lesion destroyed using liquid nitrogen: Yes   Region frozen until ice ball extended beyond lesion: Yes   Outcome: patient tolerated procedure well with no complications   Post-procedure details: wound care instructions given    Inflamed seborrheic keratosis R forearm x 1  Symptomatic, irritating, patient would like treated.   Destruction of lesion - R forearm x 1 Complexity: simple   Destruction method: cryotherapy   Informed consent: discussed and consent obtained   Timeout:  patient name, date of birth, surgical site, and procedure verified Lesion destroyed using liquid nitrogen: Yes   Region frozen until ice ball extended beyond lesion: Yes   Outcome: patient tolerated procedure well with no complications   Post-procedure details: wound care instructions given    Neoplasm of skin R medial calf  Epidermal / dermal shaving  Lesion diameter (  cm):  1.5 Informed consent: discussed and consent obtained   Timeout: patient name, date of birth, surgical site, and procedure verified   Procedure prep:  Patient was prepped and draped in usual sterile fashion Prep type:  Isopropyl alcohol Anesthesia: the lesion was anesthetized in a standard fashion   Anesthetic:  1% lidocaine w/ epinephrine  1-100,000 buffered w/ 8.4% NaHCO3 Instrument used: flexible razor blade   Hemostasis achieved with: pressure, aluminum chloride and electrodesiccation   Outcome: patient tolerated procedure well   Post-procedure details: sterile dressing applied and wound care instructions given   Dressing type: bandage and bacitracin    Destruction of lesion Complexity: extensive   Destruction method: electrodesiccation and curettage   Informed consent: discussed and consent obtained   Timeout:  patient name, date of birth, surgical site, and procedure verified Procedure prep:  Patient was prepped and draped in usual sterile fashion Prep type:  Isopropyl alcohol Anesthesia: the lesion was anesthetized in a standard fashion   Anesthetic:  1% lidocaine w/ epinephrine 1-100,000 buffered w/ 8.4% NaHCO3 Curettage performed in three different directions: Yes   Electrodesiccation performed over the curetted area: Yes   Final wound size (cm):  1.5 Hemostasis achieved with:  pressure, aluminum chloride and electrodesiccation Outcome: patient tolerated procedure well with no complications   Post-procedure details: sterile dressing applied and wound care instructions given   Dressing type: bandage and petrolatum    Specimen 1 - Surgical pathology Differential Diagnosis: D48.5 R/O SCC vs BCC  Check Margins: No Scaly pap 1.5cm EDC today  Skin cancer screening  Lentigo  Melanocytic nevus, unspecified location  History of dysplastic nevus  History of basal cell carcinoma  SKIN CANCER SCREENING PERFORMED TODAY.  ACTINIC DAMAGE - Chronic condition, secondary to cumulative UV/sun exposure - diffuse scaly erythematous macules with underlying dyspigmentation - Recommend daily broad spectrum sunscreen SPF 30+ to sun-exposed areas, reapply every 2 hours as needed.  - Staying in the shade or wearing long sleeves, sun glasses (UVA+UVB protection) and wide brim hats (4-inch brim around the entire circumference  of the hat) are also recommended for sun protection.  - Call for new or changing lesions.  Return in about 1 year (around 10/08/2023) for TBSE, Hx of BCC, Hx of Dysplastic nevi, Hx of AKs.  I, Ardis Rowan, RMA, am acting as scribe for Armida Sans, MD .  Documentation: I have reviewed the above documentation for accuracy and completeness, and I agree with the above.  Armida Sans, MD

## 2022-10-08 NOTE — Patient Instructions (Addendum)
Cryotherapy Aftercare  Wash gently with soap and water everyday.   Apply Vaseline and Band-Aid daily until healed.    Wound Care Instructions  Cleanse wound gently with soap and water once a day then pat dry with clean gauze. Apply a thin coat of Petrolatum (petroleum jelly, "Vaseline") over the wound (unless you have an allergy to this). We recommend that you use a new, sterile tube of Vaseline. Do not pick or remove scabs. Do not remove the yellow or white "healing tissue" from the base of the wound.  Cover the wound with fresh, clean, nonstick gauze and secure with paper tape. You may use Band-Aids in place of gauze and tape if the wound is small enough, but would recommend trimming much of the tape off as there is often too much. Sometimes Band-Aids can irritate the skin.  You should call the office for your biopsy report after 1 week if you have not already been contacted.  If you experience any problems, such as abnormal amounts of bleeding, swelling, significant bruising, significant pain, or evidence of infection, please call the office immediately.  FOR ADULT SURGERY PATIENTS: If you need something for pain relief you may take 1 extra strength Tylenol (acetaminophen) AND 2 Ibuprofen (200mg each) together every 4 hours as needed for pain. (do not take these if you are allergic to them or if you have a reason you should not take them.) Typically, you may only need pain medication for 1 to 3 days.     Due to recent changes in healthcare laws, you may see results of your pathology and/or laboratory studies on MyChart before the doctors have had a chance to review them. We understand that in some cases there may be results that are confusing or concerning to you. Please understand that not all results are received at the same time and often the doctors may need to interpret multiple results in order to provide you with the best plan of care or course of treatment. Therefore, we ask that you  please give us 2 business days to thoroughly review all your results before contacting the office for clarification. Should we see a critical lab result, you will be contacted sooner.   If You Need Anything After Your Visit  If you have any questions or concerns for your doctor, please call our main line at [phone] and press option 4 to reach your doctor's medical assistant. If no one answers, please leave a voicemail as directed and we will return your call as soon as possible. Messages left after 4 pm will be answered the following business day.   You may also send us a message via MyChart. We typically respond to MyChart messages within 1-2 business days.  For prescription refills, please ask your pharmacy to contact our office. Our fax number is [phone].  If you have an urgent issue when the clinic is closed that cannot wait until the next business day, you can page your doctor at the number below.    Please note that while we do our best to be available for urgent issues outside of office hours, we are not available 24/7.   If you have an urgent issue and are unable to reach us, you may choose to seek medical care at your doctor's office, retail clinic, urgent care center, or emergency room.  If you have a medical emergency, please immediately call 911 or go to the emergency department.  Pager Numbers  - Dr. Kowalski: [phone]  -   Dr. Moye: [phone]  - Dr. Stewart: [phone]  In the event of inclement weather, please call our main line at [phone] for an update on the status of any delays or closures.  Dermatology Medication Tips: Please keep the boxes that topical medications come in in order to help keep track of the instructions about where and how to use these. Pharmacies typically print the medication instructions only on the boxes and not directly on the medication tubes.   If your medication is too expensive, please contact our office at  [phone] option 4 or send us a message through MyChart.   We are unable to tell what your co-pay for medications will be in advance as this is different depending on your insurance coverage. However, we may be able to find a substitute medication at lower cost or fill out paperwork to get insurance to cover a needed medication.   If a prior authorization is required to get your medication covered by your insurance company, please allow us 1-2 business days to complete this process.  Drug prices often vary depending on where the prescription is filled and some pharmacies may offer cheaper prices.  The website www.goodrx.com contains coupons for medications through different pharmacies. The prices here do not account for what the cost may be with help from insurance (it may be cheaper with your insurance), but the website can give you the price if you did not use any insurance.  - You can print the associated coupon and take it with your prescription to the pharmacy.  - You may also stop by our office during regular business hours and pick up a GoodRx coupon card.  - If you need your prescription sent electronically to a different pharmacy, notify our office through [place] MyChart or by phone at [phone] option 4.     Si Usted Necesita Algo Despus de Su Visita  Tambin puede enviarnos un mensaje a travs de MyChart. Por lo general respondemos a los mensajes de MyChart en el transcurso de 1 a 2 das hbiles.  Para renovar recetas, por favor pida a su farmacia que se ponga en contacto con nuestra oficina. Nuestro nmero de fax es el [phone].  Si tiene un asunto urgente cuando la clnica est cerrada y que no puede esperar hasta el siguiente da hbil, puede llamar/localizar a su doctor(a) al nmero que aparece a continuacin.   Por favor, tenga en cuenta que aunque hacemos todo lo posible para estar disponibles para asuntos urgentes fuera del horario de oficina, no estamos  disponibles las 24 horas del da, los 7 das de la semana.   Si tiene un problema urgente y no puede comunicarse con nosotros, puede optar por buscar atencin mdica  en el consultorio de su doctor(a), en una clnica privada, en un centro de atencin urgente o en una sala de emergencias.  Si tiene una emergencia mdica, por favor llame inmediatamente al 911 o vaya a la sala de emergencias.  Nmeros de bper  - Dr. Kowalski: [phone]  - Dra. Moye: [phone]  - Dra. Stewart: [phone]  En caso de inclemencias del tiempo, por favor llame a nuestra lnea principal al [phone] para una actualizacin sobre el estado de cualquier retraso o cierre.  Consejos para la medicacin en dermatologa: Por favor, guarde las cajas en las que vienen los medicamentos de uso tpico para ayudarle a seguir las instrucciones sobre dnde y cmo usarlos. Las farmacias generalmente imprimen las instrucciones del medicamento slo en las cajas y   no directamente en los tubos del medicamento.   Si su medicamento es muy caro, por favor, pngase en contacto con nuestra oficina llamando al [phone] y presione la opcin 4 o envenos un mensaje a travs de MyChart.   No podemos decirle cul ser su copago por los medicamentos por adelantado ya que esto es diferente dependiendo de la cobertura de su seguro. Sin embargo, es posible que podamos encontrar un medicamento sustituto a menor costo o llenar un formulario para que el seguro cubra el medicamento que se considera necesario.   Si se requiere una autorizacin previa para que su compaa de seguros cubra su medicamento, por favor permtanos de 1 a 2 das hbiles para completar este proceso.  Los precios de los medicamentos varan con frecuencia dependiendo del lugar de dnde se surte la receta y alguna farmacias pueden ofrecer precios ms baratos.  El sitio web www.goodrx.com tiene cupones para medicamentos de diferentes farmacias. Los precios aqu no  tienen en cuenta lo que podra costar con la ayuda del seguro (puede ser ms barato con su seguro), pero el sitio web puede darle el precio si no utiliz ningn seguro.  - Puede imprimir el cupn correspondiente y llevarlo con su receta a la farmacia.  - Tambin puede pasar por nuestra oficina durante el horario de atencin regular y recoger una tarjeta de cupones de GoodRx.  - Si necesita que su receta se enve electrnicamente a una farmacia diferente, informe a nuestra oficina a travs de MyChart de [place] o por telfono llamando al [phone] y presione la opcin 4.  

## 2022-10-09 ENCOUNTER — Encounter: Payer: Self-pay | Admitting: Urology

## 2022-10-09 ENCOUNTER — Ambulatory Visit: Payer: Medicare HMO | Admitting: Urology

## 2022-10-09 VITALS — BP 185/82 | HR 72 | Ht 67.0 in | Wt 195.0 lb

## 2022-10-09 DIAGNOSIS — N400 Enlarged prostate without lower urinary tract symptoms: Secondary | ICD-10-CM

## 2022-10-09 DIAGNOSIS — N401 Enlarged prostate with lower urinary tract symptoms: Secondary | ICD-10-CM

## 2022-10-09 DIAGNOSIS — R972 Elevated prostate specific antigen [PSA]: Secondary | ICD-10-CM | POA: Diagnosis not present

## 2022-10-09 LAB — URINALYSIS, COMPLETE
Bilirubin, UA: NEGATIVE
Leukocytes,UA: NEGATIVE
Nitrite, UA: NEGATIVE
Specific Gravity, UA: >1.03 — ABNORMAL HIGH (ref 1.005–1.030)
Urobilinogen, Ur: 0.2 mg/dL (ref 0.2–1.0)
pH, UA: 5 (ref 5.0–7.5)

## 2022-10-09 LAB — MICROSCOPIC EXAMINATION

## 2022-10-09 NOTE — Progress Notes (Signed)
I, Maysun L Gibbs,acting as a scribe for Riki Altes, MD.,have documented all relevant documentation on the behalf of Riki Altes, MD,as directed by  Riki Altes, MD while in the presence of Riki Altes, MD.  10/09/2022 11:07 AM   Hector Edin 1946-02-29 [id number]  Referring provider: Enid Baas, MD [address] [place],  [place] [address]  Chief Complaint  Patient presents with   Other    HPI: Hector Woodard is a 77 y.o. male presents for transfer of urologic care,   Previously followed by Dr. Evelene Croon for BPH and a history of an elevated PSA. Last saw Dr. Evelene Croon 10/21/21 and was doing well. PSA at that visit was stable at 2.5  History of PSA 5.9 in 2020, which was 5.1 on repeat. He had a prostate MRI, which showed a 64 cc gland and no lesion suspicious for high-grade prostate cancer.  PSA drawn by his PCP January 2024 was 2.65  Previous prostate biopsy by me around 2010 which was benign and he thinks the PSA was in the 4 range. Remains on dutasteride 3 times weekly and has no bothersome lower urinary tract symptoms.   PMH: Past Medical History:  Diagnosis Date   Actinic keratosis    Anginal pain (HCC)    Basal cell carcinoma (BCC) of face 06/21/2018   R mid cheek, Mohs 11/04/2018,  L mid forehead, Mohs 11/18/2018   Hx of dysplastic nevus 09/20/2018   L posterior calf, moderate atypia   Hypercholesteremia    Hypertension    PIN (prostatic intraepithelial neoplasia)    Vertigo 2015    Surgical History: Past Surgical History:  Procedure Laterality Date   CARDIAC CATHETERIZATION Left 02/28/2015   Procedure: Left Heart Cath and Coronary Angiography;  Surgeon: Dalia Heading, MD;  Location: [facility] INVASIVE CV [facility];  Service: Cardiovascular;  Laterality: Left;   CARDIAC CATHETERIZATION N/A 02/28/2015   Procedure: Coronary Stent Intervention;  Surgeon: Alwyn Pea, MD;  Location: [facility] INVASIVE CV [facility];  Service: Cardiovascular;  Laterality:  N/A;   COLONOSCOPY     X2   KNEE ARTHROSCOPY WITH MEDIAL MENISECTOMY Right 08/23/2014   Procedure: KNEE ARTHROSCOPY WITH MEDIAL MENISECTOMY, plica excision and synovectomy;  Surgeon: Juanell Fairly, MD;  Location: [facility] ORS;  Service: Orthopedics;  Laterality: Right;   MOHS SURGERY  11/04/2018   right mid cheek, 11/18/18 left mid forehead    Home Medications:  Allergies as of 10/09/2022       Reactions   Crestor [rosuvastatin Calcium] Rash   Had started three new meds, few days later had a rash. Stopped the Crestor and rash is improved. Other 2 meds were continued.        Medication List        Accurate as of October 09, 2022 11:07 AM. If you have any questions, ask your nurse or doctor.          STOP taking these medications    CoQ-10 100 MG Caps Stopped by: Roquel Burgin C Chasin Findling   Fish Oil 1000 MG Caps Stopped by: Riki Altes       TAKE these medications    aspirin EC 81 MG tablet Take 81 mg by mouth daily.   atorvastatin 40 MG tablet Commonly known as: LIPITOR Take 1 tablet (40 mg total) by mouth daily.   benazepril 10 MG tablet Commonly known as: LOTENSIN TAKE 1 TABLET BY MOUTH EVERY DAY   clopidogrel 75 MG tablet Commonly known  as: PLAVIX Take 1 tablet (75 mg total) by mouth daily.   dutasteride 0.5 MG capsule Commonly known as: AVODART Take 1 capsule (0.5 mg total) by mouth daily.   multivitamin capsule Take 1 capsule by mouth daily.        Allergies:  Allergies  Allergen Reactions   Crestor [Rosuvastatin Calcium] Rash    Had started three new meds, few days later had a rash. Stopped the Crestor and rash is improved. Other 2 meds were continued.    Family History: Family History  Problem Relation Age of Onset   Congestive Heart Failure Mother    Heart disease Father    Arrhythmia Sister    Mental illness Maternal Grandmother    Cancer Paternal Grandfather     Social History:  reports that he has never smoked. He has never used  smokeless tobacco. He reports current alcohol use of about 5.0 standard drinks of alcohol per week. He reports that he does not use drugs.   Physical Exam: BP (!) 185/82   Pulse 72   Ht 5\' 7"  (1.702 m)   Wt 195 lb (88.5 kg)   BMI 30.54 kg/m   Constitutional:  Alert and oriented, No acute distress. HEENT: [place] AT Respiratory: Normal respiratory effort, no increased work of breathing. GU: Prostate 40 grams, smooth without nodules.  Psychiatric: Normal mood and affect.   Assessment & Plan:    1. Elevated PSA Recent uncorrected PSA stable at 2.65  Benign DRE Continue annual follow-up.  2. BPH with LUTS  Stable on dutasteride  I have reviewed the above documentation for accuracy and completeness, and I agree with the above.   Riki Altes, MD  [facility] [address] [place], [place] [address] [phone]

## 2022-10-10 ENCOUNTER — Encounter: Payer: Self-pay | Admitting: Dermatology

## 2022-10-15 DIAGNOSIS — N400 Enlarged prostate without lower urinary tract symptoms: Secondary | ICD-10-CM | POA: Diagnosis not present

## 2022-10-15 DIAGNOSIS — I10 Essential (primary) hypertension: Secondary | ICD-10-CM | POA: Diagnosis not present

## 2022-10-15 DIAGNOSIS — I25118 Atherosclerotic heart disease of native coronary artery with other forms of angina pectoris: Secondary | ICD-10-CM | POA: Diagnosis not present

## 2022-10-15 DIAGNOSIS — Z79899 Other long term (current) drug therapy: Secondary | ICD-10-CM | POA: Diagnosis not present

## 2022-10-15 DIAGNOSIS — Z125 Encounter for screening for malignant neoplasm of prostate: Secondary | ICD-10-CM | POA: Diagnosis not present

## 2022-10-18 ENCOUNTER — Encounter: Payer: Self-pay | Admitting: Urology

## 2022-11-03 DIAGNOSIS — H524 Presbyopia: Secondary | ICD-10-CM | POA: Diagnosis not present

## 2022-11-03 DIAGNOSIS — H40013 Open angle with borderline findings, low risk, bilateral: Secondary | ICD-10-CM | POA: Diagnosis not present

## 2022-11-03 DIAGNOSIS — H25813 Combined forms of age-related cataract, bilateral: Secondary | ICD-10-CM | POA: Diagnosis not present

## 2022-11-03 DIAGNOSIS — H5213 Myopia, bilateral: Secondary | ICD-10-CM | POA: Diagnosis not present

## 2022-12-30 ENCOUNTER — Telehealth: Payer: Self-pay

## 2022-12-30 NOTE — Telephone Encounter (Addendum)
Tried calling patient regarding results. No answer. LMOM for patient to call office.   ----- Message from Armida Sans sent at 12/30/2022 11:10 AM EDT ----- Diagnosis Skin , right medial calf BASAL CELL CARCINOMA, SUPERFICIAL AND NODULAR PATTERNS  Cancer = BCC Already treated Recheck next visit

## 2023-01-01 ENCOUNTER — Telehealth: Payer: Self-pay

## 2023-01-01 NOTE — Telephone Encounter (Signed)
Left pt msg to call for bx results/sh 

## 2023-01-01 NOTE — Telephone Encounter (Signed)
-----   Message from Hector Woodard sent at 12/30/2022 11:10 AM EDT ----- Diagnosis Skin , right medial calf BASAL CELL CARCINOMA, SUPERFICIAL AND NODULAR PATTERNS  Cancer = BCC Already treated Recheck next visit

## 2023-01-01 NOTE — Telephone Encounter (Addendum)
Tried calling patient. No answer. LMOM for patient to return call. ----- Message from Armida Sans sent at 12/30/2022 11:10 AM EDT ----- Diagnosis Skin , right medial calf BASAL CELL CARCINOMA, SUPERFICIAL AND NODULAR PATTERNS  Cancer = BCC Already treated Recheck next visit

## 2023-01-05 ENCOUNTER — Telehealth: Payer: Self-pay

## 2023-01-05 NOTE — Telephone Encounter (Signed)
-----   Message from Armida Sans sent at 12/30/2022 11:10 AM EDT ----- Diagnosis Skin , right medial calf BASAL CELL CARCINOMA, SUPERFICIAL AND NODULAR PATTERNS  Cancer = BCC Already treated Recheck next visit

## 2023-01-05 NOTE — Telephone Encounter (Signed)
Left voice mail to return my call. Letter sent.  ?

## 2023-04-06 DIAGNOSIS — J40 Bronchitis, not specified as acute or chronic: Secondary | ICD-10-CM | POA: Diagnosis not present

## 2023-04-10 DIAGNOSIS — N400 Enlarged prostate without lower urinary tract symptoms: Secondary | ICD-10-CM | POA: Diagnosis not present

## 2023-04-10 DIAGNOSIS — Z79899 Other long term (current) drug therapy: Secondary | ICD-10-CM | POA: Diagnosis not present

## 2023-04-10 DIAGNOSIS — R7303 Prediabetes: Secondary | ICD-10-CM | POA: Diagnosis not present

## 2023-04-10 DIAGNOSIS — Z125 Encounter for screening for malignant neoplasm of prostate: Secondary | ICD-10-CM | POA: Diagnosis not present

## 2023-04-17 DIAGNOSIS — Z1331 Encounter for screening for depression: Secondary | ICD-10-CM | POA: Diagnosis not present

## 2023-04-17 DIAGNOSIS — N400 Enlarged prostate without lower urinary tract symptoms: Secondary | ICD-10-CM | POA: Diagnosis not present

## 2023-04-17 DIAGNOSIS — I25118 Atherosclerotic heart disease of native coronary artery with other forms of angina pectoris: Secondary | ICD-10-CM | POA: Diagnosis not present

## 2023-04-17 DIAGNOSIS — Z0000 Encounter for general adult medical examination without abnormal findings: Secondary | ICD-10-CM | POA: Diagnosis not present

## 2023-04-17 DIAGNOSIS — I10 Essential (primary) hypertension: Secondary | ICD-10-CM | POA: Diagnosis not present

## 2023-04-17 DIAGNOSIS — E782 Mixed hyperlipidemia: Secondary | ICD-10-CM | POA: Diagnosis not present

## 2023-04-24 DIAGNOSIS — Z03818 Encounter for observation for suspected exposure to other biological agents ruled out: Secondary | ICD-10-CM | POA: Diagnosis not present

## 2023-04-24 DIAGNOSIS — R059 Cough, unspecified: Secondary | ICD-10-CM | POA: Diagnosis not present

## 2023-04-27 ENCOUNTER — Encounter: Payer: Self-pay | Admitting: Urology

## 2023-04-28 ENCOUNTER — Other Ambulatory Visit: Payer: Self-pay | Admitting: *Deleted

## 2023-04-28 MED ORDER — DUTASTERIDE 0.5 MG PO CAPS: 0.5000 mg | ORAL_CAPSULE | Freq: Every day | ORAL | 11 refills | Status: DC

## 2023-04-28 MED ORDER — DUTASTERIDE 0.5 MG PO CAPS: 0.5000 mg | ORAL_CAPSULE | Freq: Every day | ORAL | 2 refills | Status: AC

## 2023-04-28 NOTE — Addendum Note (Signed)
Addended by: Levada Schilling on: 04/28/2023 07:36 AM   Modules accepted: Orders

## 2023-07-29 DIAGNOSIS — H25813 Combined forms of age-related cataract, bilateral: Secondary | ICD-10-CM | POA: Diagnosis not present

## 2023-07-30 DIAGNOSIS — M5451 Vertebrogenic low back pain: Secondary | ICD-10-CM | POA: Diagnosis not present

## 2023-07-30 DIAGNOSIS — M461 Sacroiliitis, not elsewhere classified: Secondary | ICD-10-CM | POA: Diagnosis not present

## 2023-07-30 DIAGNOSIS — M9903 Segmental and somatic dysfunction of lumbar region: Secondary | ICD-10-CM | POA: Diagnosis not present

## 2023-07-30 DIAGNOSIS — M9904 Segmental and somatic dysfunction of sacral region: Secondary | ICD-10-CM | POA: Diagnosis not present

## 2023-08-17 DIAGNOSIS — M62838 Other muscle spasm: Secondary | ICD-10-CM | POA: Diagnosis not present

## 2023-08-20 DIAGNOSIS — M9903 Segmental and somatic dysfunction of lumbar region: Secondary | ICD-10-CM | POA: Diagnosis not present

## 2023-08-20 DIAGNOSIS — M461 Sacroiliitis, not elsewhere classified: Secondary | ICD-10-CM | POA: Diagnosis not present

## 2023-08-20 DIAGNOSIS — M9904 Segmental and somatic dysfunction of sacral region: Secondary | ICD-10-CM | POA: Diagnosis not present

## 2023-08-20 DIAGNOSIS — M5451 Vertebrogenic low back pain: Secondary | ICD-10-CM | POA: Diagnosis not present

## 2023-08-27 DIAGNOSIS — H8111 Benign paroxysmal vertigo, right ear: Secondary | ICD-10-CM | POA: Diagnosis not present

## 2023-08-27 DIAGNOSIS — H9311 Tinnitus, right ear: Secondary | ICD-10-CM | POA: Diagnosis not present

## 2023-09-09 DIAGNOSIS — R42 Dizziness and giddiness: Secondary | ICD-10-CM | POA: Diagnosis not present

## 2023-09-10 DIAGNOSIS — R42 Dizziness and giddiness: Secondary | ICD-10-CM | POA: Diagnosis not present

## 2023-09-10 DIAGNOSIS — H903 Sensorineural hearing loss, bilateral: Secondary | ICD-10-CM | POA: Diagnosis not present

## 2023-10-07 ENCOUNTER — Ambulatory Visit: Payer: Medicare HMO | Admitting: Dermatology

## 2023-10-07 ENCOUNTER — Encounter: Payer: Self-pay | Admitting: Dermatology

## 2023-10-07 DIAGNOSIS — D1801 Hemangioma of skin and subcutaneous tissue: Secondary | ICD-10-CM | POA: Diagnosis not present

## 2023-10-07 DIAGNOSIS — Z86018 Personal history of other benign neoplasm: Secondary | ICD-10-CM | POA: Diagnosis not present

## 2023-10-07 DIAGNOSIS — Z85828 Personal history of other malignant neoplasm of skin: Secondary | ICD-10-CM

## 2023-10-07 DIAGNOSIS — L814 Other melanin hyperpigmentation: Secondary | ICD-10-CM | POA: Diagnosis not present

## 2023-10-07 DIAGNOSIS — Z1283 Encounter for screening for malignant neoplasm of skin: Secondary | ICD-10-CM | POA: Diagnosis not present

## 2023-10-07 DIAGNOSIS — W908XXA Exposure to other nonionizing radiation, initial encounter: Secondary | ICD-10-CM | POA: Diagnosis not present

## 2023-10-07 DIAGNOSIS — Z872 Personal history of diseases of the skin and subcutaneous tissue: Secondary | ICD-10-CM

## 2023-10-07 DIAGNOSIS — D692 Other nonthrombocytopenic purpura: Secondary | ICD-10-CM | POA: Diagnosis not present

## 2023-10-07 DIAGNOSIS — D229 Melanocytic nevi, unspecified: Secondary | ICD-10-CM

## 2023-10-07 DIAGNOSIS — L821 Other seborrheic keratosis: Secondary | ICD-10-CM | POA: Diagnosis not present

## 2023-10-07 DIAGNOSIS — L578 Other skin changes due to chronic exposure to nonionizing radiation: Secondary | ICD-10-CM

## 2023-10-07 NOTE — Progress Notes (Signed)
   Follow-Up Visit   Subjective  Hector Woodard is a 78 y.o. male who presents for the following: Skin Cancer Screening and Full Body Skin Exam Hx of ak and isks, hx of bcc hx of dysplastic nevus  The patient presents for Total-Body Skin Exam (TBSE) for skin cancer screening and mole check. The patient has spots, moles and lesions to be evaluated, some may be new or changing and the patient may have concern these could be cancer.  The following portions of the chart were reviewed this encounter and updated as appropriate: medications, allergies, medical history  Review of Systems:  No other skin or systemic complaints except as noted in HPI or Assessment and Plan.  Objective  Well appearing patient in no apparent distress; mood and affect are within normal limits.  A full examination was performed including scalp, head, eyes, ears, nose, lips, neck, chest, axillae, abdomen, back, buttocks, bilateral upper extremities, bilateral lower extremities, hands, feet, fingers, toes, fingernails, and toenails. All findings within normal limits unless otherwise noted below.   Relevant physical exam findings are noted in the Assessment and Plan.   Assessment & Plan   SKIN CANCER SCREENING PERFORMED TODAY.  ACTINIC DAMAGE - Chronic condition, secondary to cumulative UV/sun exposure - diffuse scaly erythematous macules with underlying dyspigmentation - Recommend daily broad spectrum sunscreen SPF 30+ to sun-exposed areas, reapply every 2 hours as needed.  - Staying in the shade or wearing long sleeves, sun glasses (UVA+UVB protection) and wide brim hats (4-inch brim around the entire circumference of the hat) are also recommended for sun protection.  - Call for new or changing lesions.  LENTIGINES, SEBORRHEIC KERATOSES, HEMANGIOMAS - Benign normal skin lesions - Benign-appearing - Call for any changes  MELANOCYTIC NEVI - Tan-brown and/or pink-flesh-colored symmetric macules and papules - Benign  appearing on exam today - Observation - Call clinic for new or changing moles - Recommend daily use of broad spectrum spf 30+ sunscreen to sun-exposed areas.   Purpura - Chronic; persistent and recurrent.  Treatable, but not curable. - Violaceous macules and patches - Benign - Related to trauma, age, sun damage and/or use of blood thinners, chronic use of topical and/or oral steroids - Observe - Can use OTC arnica containing moisturizer such as Dermend Bruise Formula if desired - Call for worsening or other concerns  HISTORY OF DYSPLASTIC NEVUS 09/20/2018 left posterior calf - moderate  No evidence of recurrence today Recommend regular full body skin exams Recommend daily broad spectrum sunscreen SPF 30+ to sun-exposed areas, reapply every 2 hours as needed.  Call if any new or changing lesions are noted between office visits  HISTORY OF BASAL CELL CARCINOMA OF THE SKIN 10/08/22 right medial calf treated with [facility]  06/21/2018 right mid cheek - Mohs 11/04/2018 and left mid forehead Mohs 11/18/2018 - No evidence of recurrence today - Recommend regular full body skin exams - Recommend daily broad spectrum sunscreen SPF 30+ to sun-exposed areas, reapply every 2 hours as needed.  - Call if any new or changing lesions are noted between office visits   Return in about 1 year (around 10/06/2024) for TBSE.  IEleanor Blush, CMA, am acting as scribe for Alm Rhyme, MD.   Documentation: I have reviewed the above documentation for accuracy and completeness, and I agree with the above.  Alm Rhyme, MD

## 2023-10-07 NOTE — Patient Instructions (Addendum)
 Seborrheic Keratosis  What causes seborrheic keratoses? Seborrheic keratoses are harmless, common skin growths that first appear during adult life.  As time goes by, more growths appear.  Some people may develop a large number of them.  Seborrheic keratoses appear on both covered and uncovered body parts.  They are not caused by sunlight.  The tendency to develop seborrheic keratoses can be inherited.  They vary in color from skin-colored to gray, brown, or even black.  They can be either smooth or have a rough, warty surface.   Seborrheic keratoses are superficial and look as if they were stuck on the skin.  Under the microscope this type of keratosis looks like layers upon layers of skin.  That is why at times the top layer may seem to fall off, but the rest of the growth remains and re-grows.    Treatment Seborrheic keratoses do not need to be treated, but can easily be removed in the office.  Seborrheic keratoses often cause symptoms when they rub on clothing or jewelry.  Lesions can be in the way of shaving.  If they become inflamed, they can cause itching, soreness, or burning.  Removal of a seborrheic keratosis can be accomplished by freezing, burning, or surgery. If any spot bleeds, scabs, or grows rapidly, please return to have it checked, as these can be an indication of a skin cancer.   Melanoma ABCDEs  Melanoma is the most dangerous type of skin cancer, and is the leading cause of death from skin disease.  You are more likely to develop melanoma if you: Have light-colored skin, light-colored eyes, or red or blond hair Spend a lot of time in the sun Tan regularly, either outdoors or in a tanning bed Have had blistering sunburns, especially during childhood Have a close family member who has had a melanoma Have atypical moles or large birthmarks  Early detection of melanoma is key since treatment is typically straightforward and cure rates are extremely high if we catch it early.    The first sign of melanoma is often a change in a mole or a new dark spot.  The ABCDE system is a way of remembering the signs of melanoma.  A for asymmetry:  The two halves do not match. B for border:  The edges of the growth are irregular. C for color:  A mixture of colors are present instead of an even brown color. D for diameter:  Melanomas are usually (but not always) greater than 6mm - the size of a pencil eraser. E for evolution:  The spot keeps changing in size, shape, and color.  Please check your skin once per month between visits. You can use a small mirror in front and a large mirror behind you to keep an eye on the back side or your body.   If you see any new or changing lesions before your next follow-up, please call to schedule a visit.  Please continue daily skin protection including broad spectrum sunscreen SPF 30+ to sun-exposed areas, reapplying every 2 hours as needed when you're outdoors.   Staying in the shade or wearing long sleeves, sun glasses (UVA+UVB protection) and wide brim hats (4-inch brim around the entire circumference of the hat) are also recommended for sun protection.    Due to recent changes in healthcare laws, you may see results of your pathology and/or laboratory studies on MyChart before the doctors have had a chance to review them. We understand that in some cases there may  be results that are confusing or concerning to you. Please understand that not all results are received at the same time and often the doctors may need to interpret multiple results in order to provide you with the best plan of care or course of treatment. Therefore, we ask that you please give [place] 2 business days to thoroughly review all your results before contacting the office for clarification. Should we see a critical lab result, you will be contacted sooner.   If You Need Anything After Your Visit  If you have any questions or concerns for your doctor, please call our main  line at [phone] and press option 4 to reach your doctor's medical assistant. If no one answers, please leave a voicemail as directed and we will return your call as soon as possible. Messages left after 4 pm will be answered the following business day.   You may also send [place] a message via MyChart. We typically respond to MyChart messages within 1-2 business days.  For prescription refills, please ask your pharmacy to contact our office. Our fax number is [phone].  If you have an urgent issue when the clinic is closed that cannot wait until the next business day, you can page your doctor at the number below.    Please note that while we do our best to be available for urgent issues outside of office hours, we are not available 24/7.   If you have an urgent issue and are unable to reach [place], you may choose to seek medical care at your doctor's office, retail clinic, urgent care center, or emergency room.  If you have a medical emergency, please immediately call 911 or go to the emergency department.  Pager Numbers  - Dr. Gwen Pounds: [phone]  - Dr. Roseanne Reno: [phone]  - Dr. Katrinka Blazing: [phone]   In the event of inclement weather, please call our main line at [phone] for an update on the status of any delays or closures.  Dermatology Medication Tips: Please keep the boxes that topical medications come in in order to help keep track of the instructions about where and how to use these. Pharmacies typically print the medication instructions only on the boxes and not directly on the medication tubes.   If your medication is too expensive, please contact our office at [phone] option 4 or send [place] a message through MyChart.   We are unable to tell what your co-pay for medications will be in advance as this is different depending on your insurance coverage. However, we may be able to find a substitute medication at lower cost or fill out paperwork to get insurance to cover  a needed medication.   If a prior authorization is required to get your medication covered by your insurance company, please allow [place] 1-2 business days to complete this process.  Drug prices often vary depending on where the prescription is filled and some pharmacies may offer cheaper prices.  The website www.goodrx.com contains coupons for medications through different pharmacies. The prices here do not account for what the cost may be with help from insurance (it may be cheaper with your insurance), but the website can give you the price if you did not use any insurance.  - You can print the associated coupon and take it with your prescription to the pharmacy.  - You may also stop by our office during regular business hours and pick up a GoodRx coupon card.  - If you need your prescription sent electronically to  a different pharmacy, notify our office through [facility] or by phone at [phone] option 4.     Si Usted Necesita Algo Despus de Su Visita  Tambin puede enviarnos un mensaje a travs de Clinical cytogeneticist. Por lo general respondemos a los mensajes de MyChart en el transcurso de 1 a 2 das hbiles.  Para renovar recetas, por favor pida a su farmacia que se ponga en contacto con nuestra oficina. Annie Sable de fax es [place] [phone].  Si tiene un asunto urgente cuando la clnica est cerrada y que no puede esperar hasta el siguiente da hbil, puede llamar/localizar a su doctor(a) al nmero que aparece a continuacin.   Por favor, tenga en cuenta que aunque hacemos todo lo posible para estar disponibles para asuntos urgentes fuera del horario de [place], no estamos disponibles las 24 horas del da, los 7 [address]  [address] de la [place].   Si tiene un problema urgente y no puede comunicarse con nosotros, puede optar por buscar atencin mdica  en el consultorio de su doctor(a), en una clnica privada, en un centro de atencin urgente o en una sala de emergencias.  Si tiene Psychologist, clinical, por favor llame inmediatamente al 911 o vaya a la sala de emergencias.  Nmeros de bper  - Dr. Gwen Pounds: [phone]  - Dra. Roseanne Reno: [phone]  - Dr. Katrinka Blazing: [phone]   En caso de inclemencias del tiempo, por favor llame a Lacy Duverney principal al [phone] para una actualizacin sobre el [place] de cualquier retraso o cierre.  Consejos para la medicacin en dermatologa: Por favor, guarde las cajas en las que vienen los medicamentos de uso tpico para ayudarle a seguir las instrucciones sobre dnde y cmo usarlos. Las farmacias generalmente imprimen las instrucciones del medicamento slo en las cajas y no directamente en los tubos del [place].   Si su medicamento es muy caro, por favor, pngase en contacto con Rolm Gala llamando al [phone] y presione la opcin 4 o envenos un mensaje a travs de Clinical cytogeneticist.   No podemos decirle cul ser su copago por los medicamentos por adelantado ya que esto es diferente dependiendo de la cobertura de su seguro. Sin embargo, es posible que podamos encontrar un medicamento sustituto a Audiological scientist un formulario para que el seguro cubra el medicamento que se considera necesario.   Si se requiere una autorizacin previa para que su compaa de seguros [place] su medicamento, por favor permtanos de 1 a 2 das hbiles para completar [address]  Los precios de los medicamentos varan con frecuencia dependiendo del Environmental consultant de dnde se surte la receta y alguna farmacias pueden ofrecer precios ms baratos.  El sitio web www.goodrx.com tiene cupones para medicamentos de Health and safety inspector. Los precios aqu no tienen en cuenta lo que podra costar con la ayuda del seguro (puede ser ms barato con su seguro), pero el sitio web puede darle el precio si no utiliz Tourist information centre manager.  - Puede imprimir el cupn correspondiente y llevarlo con su receta a la farmacia.  - Tambin puede pasar por nuestra oficina durante el horario de  atencin regular y Education officer, museum una tarjeta de cupones de GoodRx.  - Si necesita que su receta se enve electrnicamente a una farmacia diferente, informe a nuestra oficina a travs de MyChart de [place] o por telfono llamando al [phone] y presione la opcin 4.

## 2023-10-09 ENCOUNTER — Encounter: Payer: Self-pay | Admitting: Urology

## 2023-10-09 ENCOUNTER — Ambulatory Visit (INDEPENDENT_AMBULATORY_CARE_PROVIDER_SITE_OTHER): Payer: Self-pay | Admitting: Urology

## 2023-10-09 VITALS — BP 147/77 | HR 61 | Ht 71.5 in | Wt 185.0 lb

## 2023-10-09 DIAGNOSIS — R972 Elevated prostate specific antigen [PSA]: Secondary | ICD-10-CM

## 2023-10-09 DIAGNOSIS — N400 Enlarged prostate without lower urinary tract symptoms: Secondary | ICD-10-CM

## 2023-10-09 LAB — URINALYSIS, COMPLETE
Bilirubin, UA: NEGATIVE
Glucose, UA: NEGATIVE
Ketones, UA: NEGATIVE
Leukocytes,UA: NEGATIVE
Nitrite, UA: NEGATIVE
Protein,UA: NEGATIVE
RBC, UA: NEGATIVE
Specific Gravity, UA: 1.01 (ref 1.005–1.030)
Urobilinogen, Ur: 0.2 mg/dL (ref 0.2–1.0)
pH, UA: 6 (ref 5.0–7.5)

## 2023-10-09 LAB — MICROSCOPIC EXAMINATION: Bacteria, UA: NONE SEEN

## 2023-10-09 LAB — BLADDER SCAN AMB NON-IMAGING

## 2023-10-09 NOTE — Progress Notes (Unsigned)
 10/09/2023 11:08 AM   Hector Woodard 09-05-1945 [id number]  Referring provider: Sherial Bail, MD [address] [place],  [place] [address]  Chief Complaint  Patient presents with   Follow-up   Urologic history: 1.  Elevated PSA Prior negative biopsy ~2010 PSA 4 range PSA 5.9 2020-Dr. Burkes Prostate MRI 64 cc gland and no suspicious or indeterminate lesions in surveillance elected  2.  BPH with LUTS Dutasteride  0.5 mg 3 times weekly   HPI: Hector Woodard is a 78 y.o. male presents for annual follow-up  No bothersome LUTS Remains on dutasteride  No flank, abdominal or pelvic pain PSA performed by PCP January 2025 was 3.69 (uncorrected)   PMH: Past Medical History:  Diagnosis Date   Actinic keratosis    Anginal pain (HCC)    Basal cell carcinoma (BCC) of face 06/21/2018   R mid cheek, Mohs 11/04/2018,  L mid forehead, Mohs 11/18/2018   History of basal cell carcinoma (BCC) 10/08/2022   right medial calf ED&C done 10/08/22   Hx of dysplastic nevus 09/20/2018   L posterior calf, moderate atypia   Hypercholesteremia    Hypertension    PIN (prostatic intraepithelial neoplasia)    Vertigo 2015    Surgical History: Past Surgical History:  Procedure Laterality Date   CARDIAC CATHETERIZATION Left 02/28/2015   Procedure: Left Heart Cath and Coronary Angiography;  Surgeon: Vinie Hector Jude, MD;  Location: [facility] INVASIVE CV LAB;  Service: Cardiovascular;  Laterality: Left;   CARDIAC CATHETERIZATION N/A 02/28/2015   Procedure: Coronary Stent Intervention;  Surgeon: Cara JONETTA Lovelace, MD;  Location: [facility] INVASIVE CV LAB;  Service: Cardiovascular;  Laterality: N/A;   COLONOSCOPY     X2   KNEE ARTHROSCOPY WITH MEDIAL MENISECTOMY Right 08/23/2014   Procedure: KNEE ARTHROSCOPY WITH MEDIAL MENISECTOMY, plica excision and synovectomy;  Surgeon: Franky Cranker, MD;  Location: [facility] ORS;  Service: Orthopedics;  Laterality: Right;   MOHS SURGERY  11/04/2018   right mid cheek,  11/18/18 left mid forehead    Home Medications:  Allergies as of 10/09/2023       Reactions   Crestor  [rosuvastatin  Calcium ] Rash   Had started three new meds, few days later had a rash. Stopped the Crestor  and rash is improved. Other 2 meds were continued.        Medication List        Accurate as of October 09, 2023 11:08 AM. If you have any questions, ask your nurse or doctor.          aspirin  EC 81 MG tablet Take 81 mg by mouth daily.   atorvastatin  40 MG tablet Commonly known as: LIPITOR Take 1 tablet (40 mg total) by mouth daily.   benazepril  10 MG tablet Commonly known as: LOTENSIN  TAKE 1 TABLET BY MOUTH EVERY DAY   clopidogrel  75 MG tablet Commonly known as: PLAVIX  Take 1 tablet (75 mg total) by mouth daily.   dutasteride  0.5 MG capsule Commonly known as: AVODART  Take 1 capsule (0.5 mg total) by mouth daily.   multivitamin capsule Take 1 capsule by mouth daily.        Allergies:  Allergies  Allergen Reactions   Crestor  [Rosuvastatin  Calcium ] Rash    Had started three new meds, few days later had a rash. Stopped the Crestor  and rash is improved. Other 2 meds were continued.    Family History: Family History  Problem Relation Age of Onset   Congestive Heart Failure Mother    Heart disease Father  Arrhythmia Sister    Mental illness Maternal Grandmother    Cancer Paternal Grandfather     Social History:  reports that he has never smoked. He has never used smokeless tobacco. He reports current alcohol use of about 5.0 standard drinks of alcohol per week. He reports that he does not use drugs.   Physical Exam: BP (!) 147/77   Pulse 61   Ht 5' 11.5 (1.816 m)   Wt 185 lb (83.9 kg)   BMI 25.44 kg/m   Constitutional:  Alert and oriented, No acute distress. HEENT: [place] AT Respiratory: Normal respiratory effort, no increased work of breathing. Psychiatric: Normal mood and affect.   Assessment & Plan:    1.  BPH with LUTS Stable on  dutasteride  PVR 17 mL  2.  Elevated PSA PSA corrected for dutasteride  7.38 Recommend repeating PSA; he had intercourse last night and will schedule lab visit next week for PSA If persistently elevated above baseline will order prostate MRI He states he started dutasteride  after his PSA was 5.9   Glendia JAYSON Barba, MD  [facility] [address] [place], [place] [address] [phone]

## 2023-10-12 DIAGNOSIS — R42 Dizziness and giddiness: Secondary | ICD-10-CM | POA: Diagnosis not present

## 2023-10-15 ENCOUNTER — Other Ambulatory Visit

## 2023-10-15 ENCOUNTER — Other Ambulatory Visit: Payer: Self-pay

## 2023-10-15 DIAGNOSIS — R972 Elevated prostate specific antigen [PSA]: Secondary | ICD-10-CM | POA: Diagnosis not present

## 2023-10-16 ENCOUNTER — Other Ambulatory Visit: Payer: Self-pay | Admitting: Otolaryngology

## 2023-10-16 ENCOUNTER — Ambulatory Visit: Payer: Self-pay | Admitting: Urology

## 2023-10-16 DIAGNOSIS — R42 Dizziness and giddiness: Secondary | ICD-10-CM

## 2023-10-16 LAB — PSA: Prostate Specific Ag, Serum: 2.5 ng/mL (ref 0.0–4.0)

## 2023-10-21 DIAGNOSIS — Z79899 Other long term (current) drug therapy: Secondary | ICD-10-CM | POA: Diagnosis not present

## 2023-10-21 DIAGNOSIS — I25118 Atherosclerotic heart disease of native coronary artery with other forms of angina pectoris: Secondary | ICD-10-CM | POA: Diagnosis not present

## 2023-10-21 DIAGNOSIS — R260 Ataxic gait: Secondary | ICD-10-CM | POA: Diagnosis not present

## 2023-10-28 ENCOUNTER — Ambulatory Visit
Admission: RE | Admit: 2023-10-28 | Discharge: 2023-10-28 | Disposition: A | Discharge status: Home / Self Care | Source: Ambulatory Visit | Attending: Otolaryngology | Admitting: Otolaryngology

## 2023-10-28 DIAGNOSIS — R42 Dizziness and giddiness: Secondary | ICD-10-CM

## 2023-10-28 DIAGNOSIS — I10 Essential (primary) hypertension: Secondary | ICD-10-CM | POA: Diagnosis not present

## 2023-10-28 DIAGNOSIS — I2510 Atherosclerotic heart disease of native coronary artery without angina pectoris: Secondary | ICD-10-CM | POA: Diagnosis not present

## 2023-10-28 MED ORDER — GADOPICLENOL 0.5 MMOL/ML IV SOLN
8.0000 mL | Freq: Once | INTRAVENOUS | Status: AC | PRN
  Administered 2023-10-28: 8 mL via INTRAVENOUS

## 2023-11-18 DIAGNOSIS — R9431 Abnormal electrocardiogram [ECG] [EKG]: Secondary | ICD-10-CM | POA: Diagnosis not present

## 2023-11-18 DIAGNOSIS — I10 Essential (primary) hypertension: Secondary | ICD-10-CM | POA: Diagnosis not present

## 2023-11-18 DIAGNOSIS — R0789 Other chest pain: Secondary | ICD-10-CM | POA: Diagnosis not present

## 2023-11-18 DIAGNOSIS — I25118 Atherosclerotic heart disease of native coronary artery with other forms of angina pectoris: Secondary | ICD-10-CM | POA: Diagnosis not present

## 2023-11-18 DIAGNOSIS — R42 Dizziness and giddiness: Secondary | ICD-10-CM | POA: Diagnosis not present

## 2024-02-03 DIAGNOSIS — H25813 Combined forms of age-related cataract, bilateral: Secondary | ICD-10-CM | POA: Diagnosis not present

## 2024-10-07 ENCOUNTER — Ambulatory Visit: Admitting: Urology

## 2024-10-13 ENCOUNTER — Ambulatory Visit: Admitting: Dermatology
